# Patient Record
Sex: Female | Born: 1950
Health system: Southern US, Community
[De-identification: ages and names within clinical notes are randomized; demographics above are authoritative.]

## PROBLEM LIST (undated history)

## (undated) DIAGNOSIS — K589 Irritable bowel syndrome without diarrhea: Secondary | ICD-10-CM

## (undated) DIAGNOSIS — I341 Nonrheumatic mitral (valve) prolapse: Secondary | ICD-10-CM

## (undated) DIAGNOSIS — B001 Herpesviral vesicular dermatitis: Secondary | ICD-10-CM

## (undated) DIAGNOSIS — H269 Unspecified cataract: Secondary | ICD-10-CM

## (undated) DIAGNOSIS — R011 Cardiac murmur, unspecified: Secondary | ICD-10-CM

## (undated) DIAGNOSIS — IMO0001 Reserved for inherently not codable concepts without codable children: Secondary | ICD-10-CM

## (undated) DIAGNOSIS — K219 Gastro-esophageal reflux disease without esophagitis: Secondary | ICD-10-CM

## (undated) DIAGNOSIS — M810 Age-related osteoporosis without current pathological fracture: Secondary | ICD-10-CM

## (undated) DIAGNOSIS — R079 Chest pain, unspecified: Secondary | ICD-10-CM

## (undated) DIAGNOSIS — E785 Hyperlipidemia, unspecified: Secondary | ICD-10-CM

## (undated) HISTORY — DX: Nonrheumatic mitral (valve) prolapse: I34.1

## (undated) HISTORY — DX: Unspecified cataract: H26.9

## (undated) HISTORY — PX: CHOLECYSTECTOMY: SHX55

## (undated) HISTORY — DX: Irritable bowel syndrome, unspecified: K58.9

## (undated) HISTORY — PX: EYE SURGERY: SHX253

## (undated) HISTORY — DX: Cardiac murmur, unspecified: R01.1

## (undated) HISTORY — DX: Herpesviral vesicular dermatitis: B00.1

## (undated) HISTORY — DX: Reserved for inherently not codable concepts without codable children: IMO0001

## (undated) HISTORY — DX: Gastro-esophageal reflux disease without esophagitis: K21.9

## (undated) HISTORY — DX: Hyperlipidemia, unspecified: E78.5

## (undated) HISTORY — DX: Chest pain, unspecified: R07.9

## (undated) HISTORY — DX: Age-related osteoporosis without current pathological fracture: M81.0

---

## 2004-10-11 ENCOUNTER — Ambulatory Visit: Payer: Self-pay | Admitting: Unknown Physician Specialty

## 2005-10-14 DIAGNOSIS — IMO0001 Reserved for inherently not codable concepts without codable children: Secondary | ICD-10-CM

## 2005-10-14 HISTORY — DX: Reserved for inherently not codable concepts without codable children: IMO0001

## 2005-10-17 ENCOUNTER — Ambulatory Visit: Payer: Self-pay | Admitting: Unknown Physician Specialty

## 2006-10-30 ENCOUNTER — Ambulatory Visit: Payer: Self-pay | Admitting: Unknown Physician Specialty

## 2006-11-27 ENCOUNTER — Ambulatory Visit: Payer: Self-pay | Admitting: Unknown Physician Specialty

## 2006-12-01 ENCOUNTER — Ambulatory Visit: Payer: Self-pay | Admitting: Unknown Physician Specialty

## 2007-04-23 LAB — HM COLONOSCOPY

## 2007-11-24 ENCOUNTER — Ambulatory Visit: Payer: Self-pay | Admitting: Unknown Physician Specialty

## 2008-11-28 ENCOUNTER — Ambulatory Visit: Payer: Self-pay | Admitting: Unknown Physician Specialty

## 2009-12-05 ENCOUNTER — Ambulatory Visit: Payer: Self-pay | Admitting: Unknown Physician Specialty

## 2010-12-07 ENCOUNTER — Ambulatory Visit: Payer: Self-pay | Admitting: Unknown Physician Specialty

## 2011-10-24 ENCOUNTER — Ambulatory Visit (INDEPENDENT_AMBULATORY_CARE_PROVIDER_SITE_OTHER): Payer: 59 | Admitting: Internal Medicine

## 2011-10-24 ENCOUNTER — Encounter: Payer: Self-pay | Admitting: Internal Medicine

## 2011-10-24 VITALS — BP 102/70 | HR 78 | Temp 98.1°F | Ht 64.5 in | Wt 122.0 lb

## 2011-10-24 DIAGNOSIS — E785 Hyperlipidemia, unspecified: Secondary | ICD-10-CM

## 2011-10-24 DIAGNOSIS — Z79899 Other long term (current) drug therapy: Secondary | ICD-10-CM

## 2011-10-24 DIAGNOSIS — K589 Irritable bowel syndrome without diarrhea: Secondary | ICD-10-CM

## 2011-10-24 DIAGNOSIS — Z23 Encounter for immunization: Secondary | ICD-10-CM

## 2011-10-24 DIAGNOSIS — E559 Vitamin D deficiency, unspecified: Secondary | ICD-10-CM

## 2011-10-24 DIAGNOSIS — M8580 Other specified disorders of bone density and structure, unspecified site: Secondary | ICD-10-CM

## 2011-10-24 DIAGNOSIS — IMO0001 Reserved for inherently not codable concepts without codable children: Secondary | ICD-10-CM | POA: Insufficient documentation

## 2011-10-24 DIAGNOSIS — M810 Age-related osteoporosis without current pathological fracture: Secondary | ICD-10-CM | POA: Insufficient documentation

## 2011-10-24 DIAGNOSIS — M899 Disorder of bone, unspecified: Secondary | ICD-10-CM

## 2011-10-24 DIAGNOSIS — M949 Disorder of cartilage, unspecified: Secondary | ICD-10-CM

## 2011-10-24 DIAGNOSIS — R197 Diarrhea, unspecified: Secondary | ICD-10-CM

## 2011-10-24 MED ORDER — DICYCLOMINE HCL 10 MG PO CAPS: 10.0000 mg | ORAL_CAPSULE | Freq: Three times a day (TID) | ORAL | Status: DC

## 2011-10-24 NOTE — Assessment & Plan Note (Addendum)
She has been taking alendronate for one to three years for bone loss, but is not certain of her T scores or last DEXA screening.  Records requested.  Discussed the  Risk and benefits of prolonged use of bisphophonates and made suggestions to her exercise regimen to add weight lifting/strenght training to upper torso.

## 2011-10-24 NOTE — Patient Instructions (Addendum)
I am prescribing an antispasmodic to try 30 minutes prior to each meal that typically causes cramping and diarrhea.  It is called dicyclomine.  Return at your convenience for fasting labs.

## 2011-10-25 ENCOUNTER — Other Ambulatory Visit (INDEPENDENT_AMBULATORY_CARE_PROVIDER_SITE_OTHER): Payer: 59 | Admitting: *Deleted

## 2011-10-25 DIAGNOSIS — R197 Diarrhea, unspecified: Secondary | ICD-10-CM

## 2011-10-25 DIAGNOSIS — E559 Vitamin D deficiency, unspecified: Secondary | ICD-10-CM

## 2011-10-25 DIAGNOSIS — Z79899 Other long term (current) drug therapy: Secondary | ICD-10-CM

## 2011-10-25 DIAGNOSIS — E785 Hyperlipidemia, unspecified: Secondary | ICD-10-CM

## 2011-10-25 LAB — COMPREHENSIVE METABOLIC PANEL
ALT: 20 U/L (ref 0–35)
AST: 27 U/L (ref 0–37)
Albumin: 4.1 g/dL (ref 3.5–5.2)
Alkaline Phosphatase: 59 U/L (ref 39–117)
BUN: 17 mg/dL (ref 6–23)
CO2: 27 mEq/L (ref 19–32)
Calcium: 9.2 mg/dL (ref 8.4–10.5)
Chloride: 108 mEq/L (ref 96–112)
Creatinine, Ser: 0.9 mg/dL (ref 0.4–1.2)
GFR: 64.44 mL/min (ref >60.00)
Glucose, Bld: 85 mg/dL (ref 70–99)
Potassium: 4.5 mEq/L (ref 3.5–5.1)
Sodium: 140 mEq/L (ref 135–145)
Total Bilirubin: 1 mg/dL (ref 0.3–1.2)
Total Protein: 7.4 g/dL (ref 6.0–8.3)

## 2011-10-25 LAB — LIPID PANEL
Cholesterol: 190 mg/dL (ref 0–200)
HDL: 75.5 mg/dL (ref >39.00)
LDL Cholesterol: 94 mg/dL (ref 0–99)
Total CHOL/HDL Ratio: 3
Triglycerides: 101 mg/dL (ref 0.0–149.0)
VLDL: 20.2 mg/dL (ref 0.0–40.0)

## 2011-10-25 LAB — MAGNESIUM: Magnesium: 2.1 mg/dL (ref 1.5–2.5)

## 2011-10-26 LAB — VITAMIN D 25 HYDROXY (VIT D DEFICIENCY, FRACTURES): Vit D, 25-Hydroxy: 40 ng/mL (ref 30–89)

## 2011-10-27 ENCOUNTER — Encounter: Payer: Self-pay | Admitting: Internal Medicine

## 2011-10-27 DIAGNOSIS — K589 Irritable bowel syndrome without diarrhea: Secondary | ICD-10-CM | POA: Insufficient documentation

## 2011-10-27 NOTE — Assessment & Plan Note (Signed)
Discussed a trial of dicyclomine for the postprandial abdominal cramping and diarrhea that she frequently has .

## 2011-10-27 NOTE — Progress Notes (Signed)
  Subjective:    Patient ID: Taylor [name], female    DOB: [birth year], 61 y.o.   MRN: [id number]  HPI  Taylor [name] is a healthy 61 yr white female who is here to establish primary [name].  She has a history of IBS which she controls with dietary modifications.  She continues to have episodes of abdominal cramping and diarrhea after certain meals periodically but not daily.  She denies unintentional weight loss, rectal bleeding,  And nausea.  She is up to date with colon cancer screening.  Does not often have constipation.  Past Medical History  Diagnosis Date  . Normal cardiac stress test 2007  . Irritable bowel syndrome    No current outpatient prescriptions on file prior to visit.    Review of Systems  Constitutional: Negative for fever, chills, appetite change, fatigue and unexpected weight change.  HENT: Negative for hearing loss, ear pain, nosebleeds, congestion, sore throat, facial swelling, rhinorrhea, sneezing, mouth sores, trouble swallowing, neck pain, neck stiffness, voice change, postnasal drip, sinus pressure, tinnitus and ear discharge.   Eyes: Negative for pain, discharge, redness and visual disturbance.  Respiratory: Negative for cough, chest tightness, shortness of breath, wheezing and stridor.   Cardiovascular: Negative for chest pain, palpitations and leg swelling.  Gastrointestinal: Negative for nausea, vomiting, abdominal pain, diarrhea, constipation, blood in stool, abdominal distention and anal bleeding.  Genitourinary: Negative for dysuria, flank pain, vaginal bleeding and vaginal discharge.  Musculoskeletal: Negative for myalgias, arthralgias and gait problem.  Skin: Negative for color change and rash.  Neurological: Negative for dizziness, weakness, light-headedness and headaches.  Hematological: Negative for adenopathy. Does not bruise/bleed easily.  Psychiatric/Behavioral: Negative for suicidal ideas, sleep disturbance and dysphoric mood. The patient is not  nervous/anxious.        Objective:   Physical Exam  Constitutional: She is oriented to person, place, and time. She appears well-developed and well-nourished.  HENT:  Mouth/Throat: Oropharynx is clear and moist.  Eyes: EOM are normal. Pupils are equal, round, and reactive to light. No scleral icterus.  Neck: Normal range of motion. Neck supple. No JVD present. No thyromegaly present.  Cardiovascular: Normal rate, regular rhythm, normal heart sounds and intact distal pulses.   Pulmonary/Chest: Effort normal and breath sounds normal.  Abdominal: Soft. Bowel sounds are normal. She exhibits no mass. There is no tenderness.  Musculoskeletal: Normal range of motion. She exhibits no edema.  Lymphadenopathy:    She has no cervical adenopathy.  Neurological: She is alert and oriented to person, place, and time.  Skin: Skin is warm and dry.  Psychiatric: She has a normal mood and affect.          Assessment & Plan:

## 2011-10-28 ENCOUNTER — Telehealth: Payer: Self-pay | Admitting: *Deleted

## 2011-10-28 ENCOUNTER — Encounter: Payer: Self-pay | Admitting: Internal Medicine

## 2011-10-28 ENCOUNTER — Encounter: Payer: Self-pay | Admitting: *Deleted

## 2011-10-28 NOTE — Telephone Encounter (Signed)
On my family history it has that my brother and father have alcohol abuse. That is incorrect and I don't know how that got on there. I may have accidentally filled in a wrong box. Sorry. Also will all of my lab results be available on my chart and my medical history from my previous doctor. Thanks and I enjoyed meeting you last week. Burman Riis   Please review above My Chart message. I have corrected the history and will send patient a message explaining what is avail and what is not avail on My Chart. Maralyn Sago

## 2011-10-28 NOTE — Telephone Encounter (Signed)
I will correct the family history and remove the history of alcohol abuse.  I do not know if any of the records that are scanned in from other doctors will be available. Taylor [name]

## 2011-10-28 NOTE — Telephone Encounter (Signed)
Opened in error

## 2011-10-28 NOTE — Telephone Encounter (Signed)
I see no history of alcohol abuse entered into chart,  So I am not sure what patient is referring to..  Can you look into it?

## 2011-10-29 NOTE — Telephone Encounter (Signed)
This was just an FYI - I had already corrected it. No, the scanned records are not avail. Only things avail will be problem list, med list, immunization report, health maintenance, appts, any labs/resuts that YOU release from the date pt activated My Chart going forward.

## 2011-10-29 NOTE — Telephone Encounter (Signed)
Thank you Sarah

## 2011-10-30 ENCOUNTER — Telehealth: Payer: Self-pay | Admitting: *Deleted

## 2011-10-30 ENCOUNTER — Encounter: Payer: Self-pay | Admitting: *Deleted

## 2011-10-30 NOTE — Telephone Encounter (Signed)
Below is recent My Chart message. I updated pt's history. Dr Darrick Huntsman, do you remember seeing pt's records from Dr Lin Givens?

## 2011-10-30 NOTE — Telephone Encounter (Signed)
Thank you for the update. None of my relatives have substance abuse. Also you may want to add that my mother has MS and that I had gall bladder surgery about 10 years ago. Also I was wondering if by records had ever been sent by Dr.Jeffries to your office. Thanks. Nedra Hai  ----- Message -----  From: Lamar Sprinkles, CMA  Sent: 10/28/2011 1:04 PM EST  To: Burman Riis Subject: RE: Non-Urgent Medical Question   I have updated the history for your brother and father. Have either had any substance abuse problems? Or is there anything missing from your family history that we need to add? Please let me know and I will update your chart.  The only available labs or other results will be starting from the day you activated your My Chart account going forward. They have not put in place a way to load prior results at this time. Please let me know if you have any questions. I have attached a survey and we appreciate your time.   Thank you  Jeorge Reister     ----- Message ----- From: Burman Riis Sent: 10/28/2011 11:31 AM EST To: [name], MD Subject: Non-Urgent Medical Question  On my family history it has that my brother and father have alcohol abuse. That is incorrect and I don't know how that got on there. I may have accidentally filled in a wrong box. Sorry. Also will all of my lab results be available on my chart and my medical history from my previous doctor. Thanks and I enjoyed meeting you last week. Burman Riis

## 2011-10-30 NOTE — Telephone Encounter (Signed)
Release of records sent at [facility] on 1/10, sent My Chart mess to pt informing him that records could take up to 2 wks to receive.

## 2011-10-30 NOTE — Telephone Encounter (Signed)
No I have not seen them yet.

## 2011-11-22 ENCOUNTER — Encounter: Payer: Self-pay | Admitting: Internal Medicine

## 2011-11-27 ENCOUNTER — Other Ambulatory Visit: Payer: Self-pay | Admitting: *Deleted

## 2011-11-27 MED ORDER — ALENDRONATE SODIUM 70 MG PO TABS: 70.0000 mg | ORAL_TABLET | ORAL | Status: DC

## 2011-12-05 ENCOUNTER — Encounter: Payer: Self-pay | Admitting: Internal Medicine

## 2011-12-05 ENCOUNTER — Telehealth: Payer: Self-pay | Admitting: *Deleted

## 2011-12-05 NOTE — Telephone Encounter (Signed)
-----   Message from Taylor [name] to [name], MD sent at 11/22/2011 8:53 AM -----  When I went to get my fosamax prescription refilled yesterday they said that that it was the last time under my previous doctor's order and needed to be updated. I would think that my other medications will also by coming up soon also since my last physical with dr. Lin Givens was a year ago. I also wanted to make sure that your office got my records from her office that I requested on Jan. 3. Thanks. Taylor [name]

## 2011-12-06 NOTE — Telephone Encounter (Signed)
Advised pt that her records are here, she will let pharmacy know to send refill requests here.

## 2011-12-06 NOTE — Telephone Encounter (Signed)
I do not know if her records have been received.  If they have you may let her know yes or no.  Let her pharmacy know to send refill requeests to [place].

## 2011-12-07 NOTE — Telephone Encounter (Signed)
See phone note

## 2011-12-09 ENCOUNTER — Ambulatory Visit: Payer: Self-pay | Admitting: Internal Medicine

## 2011-12-10 ENCOUNTER — Ambulatory Visit: Payer: Self-pay | Admitting: Internal Medicine

## 2011-12-19 ENCOUNTER — Telehealth: Payer: Self-pay | Admitting: Internal Medicine

## 2011-12-19 NOTE — Telephone Encounter (Signed)
I am waiting for a return call from patient to ask if she has been contacted from [facility] for additional images from her mammogram.  I have the order for the additional images.

## 2011-12-24 ENCOUNTER — Encounter: Payer: Self-pay | Admitting: Internal Medicine

## 2011-12-27 ENCOUNTER — Telehealth: Payer: Self-pay | Admitting: Internal Medicine

## 2011-12-31 ENCOUNTER — Encounter: Payer: Self-pay | Admitting: Internal Medicine

## 2012-01-03 ENCOUNTER — Encounter: Payer: Self-pay | Admitting: Internal Medicine

## 2012-01-28 NOTE — Telephone Encounter (Signed)
OPened in error.

## 2012-02-28 ENCOUNTER — Other Ambulatory Visit: Payer: Self-pay | Admitting: Internal Medicine

## 2012-02-28 MED ORDER — ALENDRONATE SODIUM 70 MG PO TABS: 70.0000 mg | ORAL_TABLET | ORAL | Status: DC

## 2012-03-16 ENCOUNTER — Telehealth: Payer: Self-pay | Admitting: Internal Medicine

## 2012-03-16 NOTE — Telephone Encounter (Signed)
Caller: Lygia/Patient; PCP: Deborra Medina; CB#[phone]. Call regarding Cough/Congestion. Mrs Fortna reports she called for an appt and was sent to Triage. Sxs of cough, congestion and sore throat beginning on Thurs 03/12/12. Some fevers over the weekend and coughed up some bright red/dark blood on Sat 6/1 after a nose bleed. No further hemoptysis noted. Caller reports Dayquil helping with sxs. Home care per Cough Protocol with callback parameters. See in 24 hrs per reported facial pain and pressure, worse when bending over. Appt scheduled for Tues 6/4 at 16:15 with PCP. Caller is agreeable.

## 2012-03-17 ENCOUNTER — Ambulatory Visit (INDEPENDENT_AMBULATORY_CARE_PROVIDER_SITE_OTHER): Payer: No Typology Code available for payment source | Admitting: Internal Medicine

## 2012-03-17 ENCOUNTER — Encounter: Payer: Self-pay | Admitting: Internal Medicine

## 2012-03-17 VITALS — BP 120/78 | HR 89 | Temp 98.6°F | Resp 16 | Wt 120.5 lb

## 2012-03-17 DIAGNOSIS — J019 Acute sinusitis, unspecified: Secondary | ICD-10-CM | POA: Insufficient documentation

## 2012-03-17 MED ORDER — BENZONATATE 200 MG PO CAPS: 200.0000 mg | ORAL_CAPSULE | Freq: Two times a day (BID) | ORAL | Status: AC | PRN

## 2012-03-17 MED ORDER — HYDROCODONE-ACETAMINOPHEN 10-325 MG PO TABS: 1.0000 | ORAL_TABLET | Freq: Three times a day (TID) | ORAL | Status: AC | PRN

## 2012-03-17 MED ORDER — PREDNISONE (PAK) 10 MG PO TABS: ORAL_TABLET | ORAL | Status: AC

## 2012-03-17 MED ORDER — AMOXICILLIN-POT CLAVULANATE 875-125 MG PO TABS: 1.0000 | ORAL_TABLET | Freq: Two times a day (BID) | ORAL | Status: AC

## 2012-03-17 NOTE — Progress Notes (Signed)
Patient ID: Taylor [name], female   DOB: 12/09/1950, 61 y.o.   MRN: [id number]  Patient Active Problem List  Diagnoses  . Osteopenia  . Normal cardiac stress test  . Irritable bowel syndrome  . Sinusitis acute    Subjective:  CC:   Chief Complaint  Patient presents with  . Sinusitis    HPI:   Taylor [name] a 61 y.o. female who presents with a 6 day history of URI symptoms.  Started with sinus drainage on Wednesday,  Developed sore throat on Friday .  Used decongestnats, saline gargles and sinus lavage  Started having fevers to 100.4 and purulent blood streaked nasal discharge and sputum on Sunday. Last night coughed the entire night despite using NyquilDelsym.  No allergy symptoms or sick contacts.  Gets these symptoms twice annually.     Past Medical History  Diagnosis Date  . Normal cardiac stress test 2007  . Irritable bowel syndrome     Past Surgical History  Procedure Date  . Cholecystectomy Approx - 2002         The following portions of the patient's history were reviewed and updated as appropriate: Allergies, current medications, and problem list.    Review of Systems:   12 Pt  review of systems was negative except those addressed in the HPI,     History   Social History  . Marital Status: Married    Spouse Name: N/A    Number of Children: N/A  . Years of Education: N/A   Occupational History  . Not on file.   Social History Main Topics  . Smoking status: Never Smoker   . Smokeless tobacco: Never Used  . Alcohol Use: 1.8 oz/week    3 Glasses of wine per week  . Drug Use: No  . Sexually Active: Not on file   Other Topics Concern  . Not on file   Social History Narrative  . No narrative on file    Objective:  BP 120/78  Pulse 89  Temp(Src) 98.6 F (37 C) (Oral)  Resp 16  Wt 120 lb 8 oz (54.658 kg)  SpO2 94%  General appearance: alert, cooperative and appears stated age Ears: normal TM's and external ear canals both ears Sinuses:  maxillary tenderness bilaterally Throat: lips, mucosa, and tongue normal; teeth and gums normal, erythema without exudate in oropharynx Neck: no adenopathy, no carotid bruit, supple, symmetrical, trachea midline and thyroid not enlarged, symmetric, no tenderness/mass/nodules Back: symmetric, no curvature. ROM normal. No CVA tenderness. Lungs: clear to auscultation bilaterally Heart: regular rate and rhythm, S1, S2 normal, no murmur, click, rub or gallop Abdomen: soft, non-tender; bowel sounds normal; no masses,  no organomegaly Pulses: 2+ and symmetric Skin: Skin color, texture, turgor normal. No rashes or lesions Lymph nodes: Cervical, LAD present  Assessment and Plan:  Sinusitis acute Given chronicity of symptoms, development of facial pain and exam consistent with bacterial URI,  Will treat with empiric antibiotics, decongestants, cough suppressants and steroid taper.     Updated Medication List Outpatient Encounter Prescriptions as of 03/17/2012  Medication Sig Dispense Refill  . alendronate (FOSAMAX) 70 MG tablet Take 1 tablet (70 mg total) by mouth every 7 (seven) days. Take with a full glass of water on an empty stomach.  4 tablet  1  . Calcium-Vitamin D-Vitamin K (VIACTIV PO) Take as directed      . rosuvastatin (CRESTOR) 10 MG tablet Take one half tablet by mouth every other day      .  VITAMIN D, CHOLECALCIFEROL, PO Take one by mouth daily      . amoxicillin-clavulanate (AUGMENTIN) 875-125 MG per tablet Take 1 tablet by mouth 2 (two) times daily.  14 tablet  0  . benzonatate (TESSALON) 200 MG capsule Take 1 capsule (200 mg total) by mouth 2 (two) times daily as needed for cough.  20 capsule  0  . HYDROcodone-acetaminophen (NORCO) 10-325 MG per tablet Take 1 tablet by mouth every 8 (eight) hours as needed for pain. Or severe cough  30 tablet  0  . predniSONE (STERAPRED UNI-PAK) 10 MG tablet 6 tablets on Day 1 , then reduce by 1 tablet daily until gone  21 tablet  0  . DISCONTD:  dicyclomine (BENTYL) 10 MG capsule Take 1 capsule (10 mg total) by mouth 4 (four) times daily -  before meals and at bedtime.  90 capsule  1     No orders of the defined types were placed in this encounter.    No Follow-up on file.

## 2012-03-17 NOTE — Assessment & Plan Note (Signed)
Given chronicity of symptoms, development of facial pain and exam consistent with bacterial URI,  Will treat with empiric antibiotics, decongestants, cough suppressants and steroid taper.

## 2012-04-22 ENCOUNTER — Ambulatory Visit (INDEPENDENT_AMBULATORY_CARE_PROVIDER_SITE_OTHER)
Admission: RE | Admit: 2012-04-22 | Discharge: 2012-04-22 | Disposition: A | Discharge status: Home / Self Care | Payer: No Typology Code available for payment source | Source: Ambulatory Visit | Attending: Internal Medicine | Admitting: Internal Medicine

## 2012-04-22 ENCOUNTER — Ambulatory Visit (INDEPENDENT_AMBULATORY_CARE_PROVIDER_SITE_OTHER): Payer: No Typology Code available for payment source | Admitting: Internal Medicine

## 2012-04-22 ENCOUNTER — Encounter: Payer: Self-pay | Admitting: Internal Medicine

## 2012-04-22 ENCOUNTER — Other Ambulatory Visit (HOSPITAL_COMMUNITY)
Admission: RE | Admit: 2012-04-22 | Discharge: 2012-04-22 | Disposition: A | Discharge status: Home / Self Care | Payer: No Typology Code available for payment source | Source: Ambulatory Visit | Attending: Internal Medicine | Admitting: Internal Medicine

## 2012-04-22 VITALS — BP 102/60 | HR 78 | Temp 97.8°F | Resp 14 | Wt 119.8 lb

## 2012-04-22 DIAGNOSIS — Z79899 Other long term (current) drug therapy: Secondary | ICD-10-CM

## 2012-04-22 DIAGNOSIS — Z0000 Encounter for general adult medical examination without abnormal findings: Secondary | ICD-10-CM

## 2012-04-22 DIAGNOSIS — Z124 Encounter for screening for malignant neoplasm of cervix: Secondary | ICD-10-CM

## 2012-04-22 DIAGNOSIS — R1013 Epigastric pain: Secondary | ICD-10-CM

## 2012-04-22 DIAGNOSIS — E785 Hyperlipidemia, unspecified: Secondary | ICD-10-CM

## 2012-04-22 DIAGNOSIS — K3189 Other diseases of stomach and duodenum: Secondary | ICD-10-CM

## 2012-04-22 DIAGNOSIS — Z01419 Encounter for gynecological examination (general) (routine) without abnormal findings: Secondary | ICD-10-CM | POA: Insufficient documentation

## 2012-04-22 DIAGNOSIS — R3129 Other microscopic hematuria: Secondary | ICD-10-CM

## 2012-04-22 DIAGNOSIS — Z87898 Personal history of other specified conditions: Secondary | ICD-10-CM

## 2012-04-22 DIAGNOSIS — Z1159 Encounter for screening for other viral diseases: Secondary | ICD-10-CM | POA: Insufficient documentation

## 2012-04-22 DIAGNOSIS — Z0001 Encounter for general adult medical examination with abnormal findings: Secondary | ICD-10-CM | POA: Insufficient documentation

## 2012-04-22 DIAGNOSIS — R079 Chest pain, unspecified: Secondary | ICD-10-CM

## 2012-04-22 LAB — POCT URINALYSIS DIPSTICK
Bilirubin, UA: NEGATIVE
Glucose, UA: NEGATIVE
Ketones, UA: NEGATIVE
Nitrite, UA: NEGATIVE
Protein, UA: NEGATIVE
Spec Grav, UA: 1.02
Urobilinogen, UA: 0.2
pH, UA: 5.5

## 2012-04-22 LAB — HM MAMMOGRAPHY: HM Mammogram: ABNORMAL

## 2012-04-22 MED ORDER — OMEPRAZOLE 40 MG PO CPDR: 40.0000 mg | DELAYED_RELEASE_CAPSULE | Freq: Every day | ORAL | Status: DC

## 2012-04-22 NOTE — Assessment & Plan Note (Signed)
Annual PE done today with pelvic and breast exam and PAP done

## 2012-04-22 NOTE — Progress Notes (Signed)
Patient ID: Taylor [name], female   DOB: 1951-07-23, 61 y.o.   MRN: [id number]  Subjective:    Taylor [name] is a 61 y.o. female who presents for an annual exam. The patient has no complaints today. The patient is sexually active. GYN screening history: last pap: was normal. The patient wears seatbelts: yes. The patient participates in regular exercise: yes. Has the patient ever been transfused or tattooed?: no. The patient reports that there is not domestic violence in her life.   Menstrual History: OB History    Grav Para Term Preterm Abortions TAB SAB Ect Mult Living                  Menarche age: 60   No LMP recorded. Patient is postmenopausal.    The following portions of the patient's history were reviewed and updated as appropriate: allergies, current medications, past family history, past medical history, past social history, past surgical history and problem list.  Review of Systems A comprehensive review of systems was negative except for: Cardiovascular: positive for chest pain    Objective:    BP 102/60  Pulse 78  Temp 97.8 F (36.6 C) (Oral)  Resp 14  Wt 119 lb 12 oz (54.318 kg)  SpO2 95%  General Appearance:    Alert, cooperative, no distress, appears stated age  Head:    Normocephalic, without obvious abnormality, atraumatic  Eyes:    PERRL, conjunctiva/corneas clear, EOM's intact, fundi    benign, both eyes  Ears:    Normal TM's and external ear canals, both ears  Nose:   Nares normal, septum midline, mucosa normal, no drainage    or sinus tenderness  Throat:   Lips, mucosa, and tongue normal; teeth and gums normal  Neck:   Supple, symmetrical, trachea midline, no adenopathy;    thyroid:  no enlargement/tenderness/nodules; no carotid   bruit or JVD  Back:     Symmetric, no curvature, ROM normal, no CVA tenderness  Lungs:     Clear to auscultation bilaterally, respirations unlabored  Chest Wall:    No tenderness or deformity   Heart:    Regular rate and rhythm, S1  and S2 normal, no murmur, rub   or gallop  Breast Exam:    No tenderness, masses, or nipple abnormality  Abdomen:     Soft, non-tender, bowel sounds active all four quadrants,    no masses, no organomegaly  Genitalia:    Normal female without lesion, discharge or tenderness  Extremities:   Extremities normal, atraumatic, no cyanosis or edema  Pulses:   2+ and symmetric all extremities  Skin:   Skin color, texture, turgor normal, no rashes or lesions  Lymph nodes:   Cervical, supraclavicular, and axillary nodes normal  Neurologic:   CNII-XII intact, normal strength, sensation and reflexes    throughout  .    Assessment:   Hematuria, microscopic No prior cystoscopy.  2 years  ago will recheck today  Chest pain at rest Atypical, resolved.  Checking D dimer, chest x ray, lipase and H Pylori  Routine general medical examination at a health care facility Annual PE done today with pelvic and breast exam and PAP done    Updated Medication List Outpatient Encounter Prescriptions as of 04/22/2012  Medication Sig Dispense Refill  . alendronate (FOSAMAX) 70 MG tablet Take 1 tablet (70 mg total) by mouth every 7 (seven) days. Take with a full glass of water on an empty stomach.  4 tablet  1  .  Calcium-Vitamin D-Vitamin K (VIACTIV PO) Take as directed      . rosuvastatin (CRESTOR) 10 MG tablet Take one half tablet by mouth every other day      . VITAMIN D, CHOLECALCIFEROL, PO Take one by mouth daily      . omeprazole (PRILOSEC) 40 MG capsule Take 1 capsule (40 mg total) by mouth daily.  30 capsule  3

## 2012-04-22 NOTE — Patient Instructions (Addendum)
I am sending you to [facility] for your chest x ray.  I recommend starting omeprazole for reflux and gastritis (rx sent to [facility])  We will call with lab results

## 2012-04-22 NOTE — Assessment & Plan Note (Signed)
No prior cystoscopy.  2 years  ago will recheck today

## 2012-04-22 NOTE — Assessment & Plan Note (Signed)
Atypical, resolved.  Checking D dimer, chest x ray, lipase and H Pylori

## 2012-04-23 LAB — D-DIMER, QUANTITATIVE: D-Dimer, Quant: 0.95 ug/mL-FEU — ABNORMAL HIGH (ref 0.00–0.48)

## 2012-04-23 LAB — COMPLETE METABOLIC PANEL WITH GFR
ALT: 16 U/L (ref 0–35)
AST: 23 U/L (ref 0–37)
Albumin: 4.2 g/dL (ref 3.5–5.2)
Alkaline Phosphatase: 66 U/L (ref 39–117)
BUN: 16 mg/dL (ref 6–23)
CO2: 24 mEq/L (ref 19–32)
Calcium: 9.6 mg/dL (ref 8.4–10.5)
Chloride: 104 mEq/L (ref 96–112)
Creat: 0.92 mg/dL (ref 0.50–1.10)
GFR, Est African American: 78 mL/min
GFR, Est Non African American: 67 mL/min
Glucose, Bld: 79 mg/dL (ref 70–99)
Potassium: 4.5 mEq/L (ref 3.5–5.3)
Sodium: 138 mEq/L (ref 135–145)
Total Bilirubin: 0.6 mg/dL (ref 0.3–1.2)
Total Protein: 7.1 g/dL (ref 6.0–8.3)

## 2012-04-23 LAB — H. PYLORI ANTIBODY, IGG: H Pylori IgG: 1.03 {ISR} — ABNORMAL HIGH

## 2012-04-23 LAB — LIPID PANEL
Cholesterol: 199 mg/dL (ref 0–200)
HDL: 70 mg/dL (ref >39)
LDL Cholesterol: 108 mg/dL — ABNORMAL HIGH (ref 0–99)
Total CHOL/HDL Ratio: 2.8 Ratio
Triglycerides: 103 mg/dL (ref <150)
VLDL: 21 mg/dL (ref 0–40)

## 2012-04-23 LAB — LIPASE: Lipase: 37 U/L (ref 0–75)

## 2012-04-23 NOTE — Addendum Note (Signed)
Addended by: [name] on: 04/23/2012 04:03 PM   Modules accepted: Orders

## 2012-04-24 ENCOUNTER — Other Ambulatory Visit: Payer: Self-pay | Admitting: Internal Medicine

## 2012-04-24 ENCOUNTER — Ambulatory Visit (HOSPITAL_COMMUNITY)
Admission: RE | Admit: 2012-04-24 | Discharge: 2012-04-24 | Disposition: A | Discharge status: Home / Self Care | Payer: No Typology Code available for payment source | Source: Ambulatory Visit | Attending: Internal Medicine | Admitting: Internal Medicine

## 2012-04-24 DIAGNOSIS — Z87898 Personal history of other specified conditions: Secondary | ICD-10-CM

## 2012-04-24 DIAGNOSIS — I77810 Thoracic aortic ectasia: Secondary | ICD-10-CM | POA: Insufficient documentation

## 2012-04-24 DIAGNOSIS — R071 Chest pain on breathing: Secondary | ICD-10-CM | POA: Insufficient documentation

## 2012-04-24 LAB — URINE CULTURE: Colony Count: 7000

## 2012-04-24 MED ORDER — IOHEXOL 350 MG/ML SOLN
100.0000 mL | Freq: Once | INTRAVENOUS | Status: AC | PRN
  Administered 2012-04-24: 100 mL via INTRAVENOUS

## 2012-04-26 ENCOUNTER — Encounter: Payer: Self-pay | Admitting: Internal Medicine

## 2012-04-27 ENCOUNTER — Telehealth: Payer: Self-pay | Admitting: *Deleted

## 2012-04-27 LAB — HM PAP SMEAR: HM Pap smear: NEGATIVE

## 2012-04-27 NOTE — Telephone Encounter (Signed)
Pt states that she has had more diarrhea than usual over the weekend and also indigestion. She is wondering if this is a side effect from the Fosamax still?

## 2012-04-28 NOTE — Telephone Encounter (Signed)
Left message for pt to callback office.  

## 2012-04-28 NOTE — Telephone Encounter (Signed)
Confirm that she has stopped fosomax and started omeprazole.

## 2012-04-28 NOTE — Telephone Encounter (Signed)
Pt has stopped taking Fosamax and started Omeprazole.

## 2012-05-04 ENCOUNTER — Encounter: Payer: Self-pay | Admitting: *Deleted

## 2012-05-14 ENCOUNTER — Encounter: Payer: Self-pay | Admitting: Internal Medicine

## 2012-05-19 ENCOUNTER — Encounter: Payer: Self-pay | Admitting: Internal Medicine

## 2012-05-19 DIAGNOSIS — R319 Hematuria, unspecified: Secondary | ICD-10-CM

## 2012-05-20 ENCOUNTER — Other Ambulatory Visit (INDEPENDENT_AMBULATORY_CARE_PROVIDER_SITE_OTHER): Payer: No Typology Code available for payment source | Admitting: *Deleted

## 2012-05-20 DIAGNOSIS — N390 Urinary tract infection, site not specified: Secondary | ICD-10-CM

## 2012-05-21 LAB — URINALYSIS
Bilirubin Urine: NEGATIVE
Glucose, UA: NEGATIVE mg/dL
Hgb urine dipstick: NEGATIVE
Ketones, ur: NEGATIVE mg/dL
Leukocytes, UA: NEGATIVE
Nitrite: NEGATIVE
Protein, ur: NEGATIVE mg/dL
Specific Gravity, Urine: 1.005 (ref 1.005–1.030)
Urobilinogen, UA: 0.2 mg/dL (ref 0.0–1.0)
pH: 6 (ref 5.0–8.0)

## 2012-10-23 ENCOUNTER — Ambulatory Visit (INDEPENDENT_AMBULATORY_CARE_PROVIDER_SITE_OTHER): Payer: No Typology Code available for payment source | Admitting: Internal Medicine

## 2012-10-23 ENCOUNTER — Encounter: Payer: Self-pay | Admitting: Internal Medicine

## 2012-10-23 VITALS — BP 118/80 | HR 96 | Temp 98.4°F | Resp 16 | Wt 120.5 lb

## 2012-10-23 DIAGNOSIS — R319 Hematuria, unspecified: Secondary | ICD-10-CM

## 2012-10-23 DIAGNOSIS — Z1331 Encounter for screening for depression: Secondary | ICD-10-CM

## 2012-10-23 DIAGNOSIS — M899 Disorder of bone, unspecified: Secondary | ICD-10-CM

## 2012-10-23 DIAGNOSIS — Z79899 Other long term (current) drug therapy: Secondary | ICD-10-CM

## 2012-10-23 DIAGNOSIS — K219 Gastro-esophageal reflux disease without esophagitis: Secondary | ICD-10-CM

## 2012-10-23 DIAGNOSIS — K915 Postcholecystectomy syndrome: Secondary | ICD-10-CM

## 2012-10-23 DIAGNOSIS — M949 Disorder of cartilage, unspecified: Secondary | ICD-10-CM

## 2012-10-23 DIAGNOSIS — T50905A Adverse effect of unspecified drugs, medicaments and biological substances, initial encounter: Secondary | ICD-10-CM

## 2012-10-23 DIAGNOSIS — M810 Age-related osteoporosis without current pathological fracture: Secondary | ICD-10-CM

## 2012-10-23 DIAGNOSIS — K208 Other esophagitis without bleeding: Secondary | ICD-10-CM

## 2012-10-23 DIAGNOSIS — M8580 Other specified disorders of bone density and structure, unspecified site: Secondary | ICD-10-CM

## 2012-10-23 HISTORY — DX: Adverse effect of unspecified drugs, medicaments and biological substances, initial encounter: T50.905A

## 2012-10-23 LAB — URINALYSIS, ROUTINE W REFLEX MICROSCOPIC
Bilirubin Urine: NEGATIVE
Ketones, ur: NEGATIVE
Nitrite: NEGATIVE
Specific Gravity, Urine: 1.025 (ref 1.000–1.030)
Total Protein, Urine: NEGATIVE
Urine Glucose: NEGATIVE
Urobilinogen, UA: 0.2 (ref 0.0–1.0)
pH: 6 (ref 5.0–8.0)

## 2012-10-23 LAB — COMPREHENSIVE METABOLIC PANEL
ALT: 26 U/L (ref 0–35)
AST: 30 U/L (ref 0–37)
Albumin: 3.9 g/dL (ref 3.5–5.2)
Alkaline Phosphatase: 88 U/L (ref 39–117)
BUN: 18 mg/dL (ref 6–23)
CO2: 26 mEq/L (ref 19–32)
Calcium: 9.7 mg/dL (ref 8.4–10.5)
Chloride: 107 mEq/L (ref 96–112)
Creatinine, Ser: 1 mg/dL (ref 0.4–1.2)
GFR: 57.16 mL/min — ABNORMAL LOW (ref >60.00)
Glucose, Bld: 67 mg/dL — ABNORMAL LOW (ref 70–99)
Potassium: 4.2 mEq/L (ref 3.5–5.1)
Sodium: 140 mEq/L (ref 135–145)
Total Bilirubin: 0.7 mg/dL (ref 0.3–1.2)
Total Protein: 7.8 g/dL (ref 6.0–8.3)

## 2012-10-23 MED ORDER — LEVOFLOXACIN 500 MG PO TABS: 500.0000 mg | ORAL_TABLET | Freq: Every day | ORAL | Status: DC

## 2012-10-23 MED ORDER — CHOLESTYRAMINE 4 GM/DOSE PO POWD: 4.0000 g | Freq: Three times a day (TID) | ORAL | Status: DC

## 2012-10-23 NOTE — Progress Notes (Signed)
Patient ID: Taylor [name], female   DOB: [birth year], 62 y.o.   MRN: [id number]  Patient Active Problem List  Diagnosis  . Osteoporosis  . Normal cardiac stress test  . Irritable bowel syndrome  . Sinusitis acute  . Hematuria, microscopic  . Routine general medical examination at a health care facility  . Pill esophagitis  . Post-cholecystectomy syndrome    Subjective:  CC:   Chief Complaint  Patient presents with  . Follow-up    HPI:   Taylor [name] a 62 y.o. female who presents for months follow up.  At last visit she had an evaluation for chest pain and pill esophagitis was diagnosed,  Boniva was stopped and her symptoms resolved, but she reports recurrent  episodes of loose stools once or twice a week after her first meal of the day, which is often lunch.  Has been occurring for years she her lap chole. Previously managed with [place]. 2) Recent UTI.  2 weeks ago had 101 fever,  Headache for one week.,  Sinus  Pain and purulent drainage .  Symptoms resolved.    Past Medical History  Diagnosis Date  . Normal cardiac stress test 2007  . Irritable bowel syndrome   . Mitral valve prolapse   . Chest pain   . Fever blister   . Hyperlipidemia   . Osteoporosis     Past Surgical History  Procedure Date  . Cholecystectomy Approx - 2002    The following portions of the patient's history were reviewed and updated as appropriate: Allergies, current medications, and problem list.    Review of Systems:  Patient denies headache, fevers, malaise, unintentional weight loss, skin rash, eye pain, sinus congestion and sinus pain, sore throat, dysphagia,  hemoptysis , cough, dyspnea, wheezing, chest pain, palpitations, orthopnea, edema, abdominal pain, nausea, melena, constipation, flank pain, dysuria, hematuria, urinary  Frequency, nocturia, numbness, tingling, seizures,  Focal weakness, Loss of consciousness,  Tremor, insomnia, depression, anxiety, and suicidal ideation.       History     Social History  . Marital Status: Married    Spouse Name: N/A    Number of Children: N/A  . Years of Education: N/A   Occupational History  . Not on file.   Social History Main Topics  . Smoking status: Never Smoker   . Smokeless tobacco: Never Used  . Alcohol Use: 1.8 oz/week    3 Glasses of wine per week  . Drug Use: No  . Sexually Active: Not on file   Other Topics Concern  . Not on file   Social History Narrative  . No narrative on file    Objective:  BP 118/80  Pulse 96  Temp 98.4 F (36.9 C) (Oral)  Resp 16  Wt 120 lb 8 oz (54.658 kg)  SpO2 98%  General appearance: alert, cooperative and appears stated age Ears: normal TM's and external ear canals both ears Throat: lips, mucosa, and tongue normal; teeth and gums normal Neck: no adenopathy, no carotid bruit, supple, symmetrical, trachea midline and thyroid not enlarged, symmetric, no tenderness/mass/nodules Back: symmetric, no curvature. ROM normal. No CVA tenderness. Lungs: clear to auscultation bilaterally Heart: regular rate and rhythm, S1, S2 normal, no murmur, click, rub or gallop Abdomen: soft, non-tender; bowel sounds normal; no masses,  no organomegaly Pulses: 2+ and symmetric Skin: Skin color, texture, turgor normal. No rashes or lesions Lymph nodes: Cervical, supraclavicular, and axillary nodes normal.  Assessment and Plan:  Pill esophagitis Referral to [facility] for  follow up on 10 yr history of GERD. Not currently on therapy.  Since her symptoms are currently absent , suggested having the EGD without recent use of PPIs to assess for inflammation at baseline.   Osteoporosis  Managed for 2 years with bisphosphonates, calcium, vit D and exercise,  Will repeat DEXA in July  To assess for loss of bone density in the interim year without treatment.   Post-cholecystectomy syndrome Resuming cholestyramine; for diarrhea occurring post prandially   Updated Medication List Outpatient Encounter  Prescriptions as of 10/23/2012  Medication Sig Dispense Refill  . Calcium-Vitamin D-Vitamin K (VIACTIV PO) Take as directed      . rosuvastatin (CRESTOR) 10 MG tablet Take one half tablet by mouth every other day      . VITAMIN D, CHOLECALCIFEROL, PO Take one by mouth daily      . alendronate (FOSAMAX) 70 MG tablet Take 1 tablet (70 mg total) by mouth every 7 (seven) days. Take with a full glass of water on an empty stomach.  4 tablet  1  . cholestyramine (QUESTRAN) 4 GM/DOSE powder Take 1 packet (4 g total) by mouth 3 (three) times daily with meals.  378 g  12  . levofloxacin (LEVAQUIN) 500 MG tablet Take 1 tablet (500 mg total) by mouth daily.  7 tablet  0  . omeprazole (PRILOSEC) 40 MG capsule Take 1 capsule (40 mg total) by mouth daily.  30 capsule  3

## 2012-10-23 NOTE — Assessment & Plan Note (Addendum)
Referral to [facility] for follow up on 10 yr history of GERD. Not currently on therapy.  Since her symptoms are currently absent , suggested having the EGD without recent use of PPIs to assess for inflammation at baseline.

## 2012-10-23 NOTE — Patient Instructions (Addendum)
We are referring you to [facility] to see if you continue to have esophagitis without using the proton pump inhibitors  -------------------------------------------------------------------------------------------------------------------------------------------------  Levaquin for 7 days to clear up the sinus infection Continue sudafed PE 10  To 30 mg every 8 hours as needed for congestion Can use delsym as needed for cough

## 2012-10-25 ENCOUNTER — Encounter: Payer: Self-pay | Admitting: Internal Medicine

## 2012-10-25 DIAGNOSIS — K915 Postcholecystectomy syndrome: Secondary | ICD-10-CM | POA: Insufficient documentation

## 2012-10-25 NOTE — Assessment & Plan Note (Signed)
Resuming cholestyramine; for diarrhea occurring post prandially

## 2012-10-25 NOTE — Assessment & Plan Note (Signed)
Will repeat DEXA in July  To assess for loss of bone density in the interim year without treatment.

## 2012-11-28 ENCOUNTER — Other Ambulatory Visit: Payer: Self-pay

## 2012-12-01 ENCOUNTER — Encounter: Payer: Self-pay | Admitting: Internal Medicine

## 2012-12-01 DIAGNOSIS — Z1239 Encounter for other screening for malignant neoplasm of breast: Secondary | ICD-10-CM

## 2012-12-07 ENCOUNTER — Encounter: Payer: Self-pay | Admitting: Internal Medicine

## 2012-12-08 ENCOUNTER — Telehealth: Payer: Self-pay | Admitting: Emergency Medicine

## 2012-12-10 ENCOUNTER — Ambulatory Visit: Payer: Self-pay | Admitting: Unknown Physician Specialty

## 2012-12-11 ENCOUNTER — Other Ambulatory Visit: Payer: Self-pay | Admitting: Unknown Physician Specialty

## 2012-12-11 LAB — CLOSTRIDIUM DIFFICILE BY PCR

## 2012-12-14 LAB — STOOL CULTURE

## 2012-12-15 ENCOUNTER — Ambulatory Visit (INDEPENDENT_AMBULATORY_CARE_PROVIDER_SITE_OTHER): Payer: No Typology Code available for payment source | Admitting: Internal Medicine

## 2012-12-15 ENCOUNTER — Encounter: Payer: Self-pay | Admitting: Internal Medicine

## 2012-12-15 ENCOUNTER — Telehealth: Payer: Self-pay | Admitting: Internal Medicine

## 2012-12-15 ENCOUNTER — Ambulatory Visit: Payer: Self-pay | Admitting: Internal Medicine

## 2012-12-15 VITALS — BP 120/90 | HR 100 | Temp 97.7°F | Resp 16 | Wt 118.5 lb

## 2012-12-15 DIAGNOSIS — R1031 Right lower quadrant pain: Secondary | ICD-10-CM

## 2012-12-15 DIAGNOSIS — M25551 Pain in right hip: Secondary | ICD-10-CM | POA: Insufficient documentation

## 2012-12-15 DIAGNOSIS — A0472 Enterocolitis due to Clostridium difficile, not specified as recurrent: Secondary | ICD-10-CM | POA: Insufficient documentation

## 2012-12-15 DIAGNOSIS — R3129 Other microscopic hematuria: Secondary | ICD-10-CM

## 2012-12-15 DIAGNOSIS — M25559 Pain in unspecified hip: Secondary | ICD-10-CM

## 2012-12-15 DIAGNOSIS — G8929 Other chronic pain: Secondary | ICD-10-CM | POA: Insufficient documentation

## 2012-12-15 LAB — URINALYSIS
Bilirubin Urine: NEGATIVE
Ketones, ur: NEGATIVE
Leukocytes, UA: NEGATIVE
Nitrite: NEGATIVE
Specific Gravity, Urine: <=1.005 (ref 1.000–1.030)
Total Protein, Urine: NEGATIVE
Urine Glucose: NEGATIVE
Urobilinogen, UA: 0.2 (ref 0.0–1.0)
pH: 6 (ref 5.0–8.0)

## 2012-12-15 NOTE — Assessment & Plan Note (Signed)
Repeat UA today was normal

## 2012-12-15 NOTE — Progress Notes (Signed)
Patient ID: Taylor [name], female   DOB: [birth year], 62 y.o.   MRN: [id number]   Patient Active Problem List  Diagnosis  . Osteoporosis  . Normal cardiac stress test  . Irritable bowel syndrome  . Sinusitis acute  . Hematuria, microscopic  . Routine general medical examination at a health care facility  . Pill esophagitis  . Post-cholecystectomy syndrome  . Chronic right hip pain  . Colitis due to Clostridium difficile    Subjective:  CC:   Chief Complaint  Patient presents with  . Pain    RT inguinal to flank    HPI:   Taylor [name] a 62 y.o. female who presents with 2 separate issues recently,  She recently saw Dr. Mechele Collin for an overdue endoscopy and colonoscopy and because of concurrent bloody diarrhea and abdominal pain she was sent for a contrasted CT which showed diffuse colitis.  Stool test was positive for C dificile and she has been taking oral vancomycin qid since last Friday.  Her diarrhea has improved but she continues to report bloating and anorexia.  She has lost several pounds.  GI F/u is March11.        2) Right sided low back pain which is aggravated by lateral flexion and forward flexion but notby  twisting. Also having right inguinal pain which is brought on by sitting and climbing stairs and has been present for weeks,  At time is severe and episodic .  aggravated by seated position, abduction and improves with standing up and walking, Occasional fleeting pain after intercourse in both hips,    Past Medical History  Diagnosis Date  . Normal cardiac stress test 2007  . Irritable bowel syndrome   . Mitral valve prolapse   . Chest pain   . Fever blister   . Hyperlipidemia   . Osteoporosis     Past Surgical History  Procedure Laterality Date  . Cholecystectomy  Approx - 2002       The following portions of the patient's history were reviewed and updated as appropriate: Allergies, current medications, and problem list.    Review of Systems:   12  Pt  review of systems was negative except those addressed in the HPI,     History   Social History  . Marital Status: Married    Spouse Name: N/A    Number of Children: N/A  . Years of Education: N/A   Occupational History  . Not on file.   Social History Main Topics  . Smoking status: Never Smoker   . Smokeless tobacco: Never Used  . Alcohol Use: 1.8 oz/week    3 Glasses of wine per week  . Drug Use: No  . Sexually Active: Not on file   Other Topics Concern  . Not on file   Social History Narrative  . No narrative on file    Objective:  BP 120/90  Pulse 100  Temp(Src) 97.7 F (36.5 C) (Oral)  Resp 16  Wt 118 lb 8 oz (53.751 kg)  BMI 20.03 kg/m2  SpO2 95%  General appearance: alert, cooperative and appears stated age Ears: normal TM's and external ear canals both ears Throat: lips, mucosa, and tongue normal; teeth and gums normal Neck: no adenopathy, no carotid bruit, supple, symmetrical, trachea midline and thyroid not enlarged, symmetric, no tenderness/mass/nodules Back: symmetric, no curvature. ROM normal. No CVA tenderness. Lungs: clear to auscultation bilaterally Heart: regular rate and rhythm, S1, S2 normal, no murmur, click, rub or gallop Abdomen: soft,  non-tender; bowel sounds normal; no masses,  no organomegaly Pulses: 2+ and symmetric Skin: Skin color, texture, turgor normal. No rashes or lesions Lymph nodes: Cervical, supraclavicular, and axillary nodes normal.  Assessment and Plan:  Chronic right hip pain DJD versus bursitis,  Palin films ordered.,  Avoiding NSAIDs.  Due to colitis currently  Checking UA as well given recent recurrent diarrhea.   Hematuria, microscopic Repeat UA today was normal   Updated Medication List Outpatient Encounter Prescriptions as of 12/15/2012  Medication Sig Dispense Refill  . alendronate (FOSAMAX) 70 MG tablet Take 1 tablet (70 mg total) by mouth every 7 (seven) days. Take with a full glass of water on an empty  stomach.  4 tablet  1  . Calcium-Vitamin D-Vitamin K (VIACTIV PO) Take as directed      . cholestyramine (QUESTRAN) 4 GM/DOSE powder Take 1 packet (4 g total) by mouth 3 (three) times daily with meals.  378 g  12  . omeprazole (PRILOSEC) 40 MG capsule Take 1 capsule (40 mg total) by mouth daily.  30 capsule  3  . rosuvastatin (CRESTOR) 10 MG tablet Take one half tablet by mouth every other day      . VANCOMYCIN HCL PO Take 1 tablet by mouth 4 (four) times daily.      Marland Kitchen VITAMIN D, CHOLECALCIFEROL, PO Take one by mouth daily      . [DISCONTINUED] levofloxacin (LEVAQUIN) 500 MG tablet Take 1 tablet (500 mg total) by mouth daily.  7 tablet  0   No facility-administered encounter medications on file as of 12/15/2012.

## 2012-12-15 NOTE — Assessment & Plan Note (Signed)
DJD versus bursitis,  Palin films ordered.,  Avoiding NSAIDs.  Due to colitis currently  Checking UA as well given recent recurrent diarrhea.

## 2012-12-15 NOTE — Telephone Encounter (Signed)
Patient Information:  Caller Name: Saudia  Phone: [phone]  Patient: Taylor [name], Taylor [name]  Gender: Female  DOB: 08/03/1951  Age: 62 Years  PCP: [name] (Adults only)  Office Follow Up:  Does the office need to follow up with this patient?: No  Instructions For The Office: N/A  RN Note:  GI diagnosed C-Diff colitis; currently on Vancomycin QID. Notes new onset of itchy,  non-spreading "blistered" rash on back around waist. Onset: 12/12/12.   Current flank pain 2/10; pain was 8/10 12/14/12 PM for 1 hour.  Symptoms  Reason For Call & Symptoms: Spasms of  right flank pain and lower back pain.  Pain was severe last night; nearly went to ED.  Pain was worse when sitting.  Reviewed Health History In EMR: Yes  Reviewed Medications In EMR: Yes  Reviewed Allergies In EMR: Yes  Reviewed Surgeries / Procedures: Yes  Date of Onset of Symptoms: 12/01/2012  Treatments Tried: Ibuprofen  Treatments Tried Worked: Yes  Guideline(s) Used:  Flank Pain  Disposition Per Guideline:   See Today in Office  Reason For Disposition Reached:   Moderate pain (e.g., interferes with normal activities or awakens from sleep)  Advice Given:  Reassurance:  Mild flank and back pain can result from excessive twisting, heavy lifting, or from an un-noticed minor back injury.  Most times, the pain gets better in a couple days.  Here is some care advice that should help.  Cold or Heat:  Cold Pack: For pain or swelling, use a cold pack or ice wrapped in a wet cloth. Put it on the sore area for 20 minutes. Repeat 4 times on the first day, then as needed.  Heat Pack: If pain lasts over 2 days, apply heat to the sore area. Use a heat pack, heating pad, or warm wet washcloth. Do this for 10 minutes, then as needed.  Activity:  Continue normal activities as much as your pain permits.  Staying active is usually more healing for the back than rest.  Avoid any activities that significantly increase the pain. Avoid heavy  lifting and strenuous exercise until completely well. Staying in bed is not needed.  Pain Medicines:  For pain relief, you can take either acetaminophen, ibuprofen, or naproxen.  Acetaminophen (e.g., Tylenol):  Regular Strength Tylenol: Take 650 mg (two 325 mg pills) by mouth every 4-6 hours as needed. Each Regular Strength Tylenol pill has 325 mg of acetaminophen.  Extra Strength Tylenol: Take 1,000 mg (two 500 mg pills) every 8 hours as needed. Each Extra Strength Tylenol pill has 500 mg of acetaminophen.  Call Back If:  Fever over 100.5 F (38.1 C)  Burning with urination or blood in urine  Pain lasts over 3 days  You become worse.  Appointment Scheduled:  12/15/2012 14:15:00 Appointment Scheduled Provider:  [name] (Adults only)

## 2012-12-16 ENCOUNTER — Encounter: Payer: Self-pay | Admitting: Internal Medicine

## 2012-12-17 ENCOUNTER — Telehealth: Payer: Self-pay | Admitting: Internal Medicine

## 2012-12-17 ENCOUNTER — Ambulatory Visit: Payer: Self-pay | Admitting: Internal Medicine

## 2012-12-17 DIAGNOSIS — N63 Unspecified lump in unspecified breast: Secondary | ICD-10-CM

## 2012-12-17 NOTE — Telephone Encounter (Signed)
Orders were faxed on 12/17/12. [facility] sent back new orders to sign. These were also faxed back.

## 2012-12-17 NOTE — Telephone Encounter (Signed)
Pt is at [facility] now for her mammo and is saying she found a lump at 1 o'clock position on her left breast. Delford Field is wanting to know if we could change her order to Bilateral Diagnostic Mammo with Left breast ultrasound for lump at 1 o'clock. They can't perform anything until the order is changed but they were wanting to know if Dr. Darrick Huntsman should see her in the office again or if it could be changed ???   Please call back at [phone] ask for Selena Batten

## 2012-12-17 NOTE — Telephone Encounter (Signed)
Her hip x ray is normal  And there is no evidence of joint problems I recommend PT evaluation and   If her pain persists, however, I suggest getting an MRI . Let me know if she agrees.

## 2012-12-17 NOTE — Telephone Encounter (Signed)
diagnositc mammor orderd, in the future ,  Print out the requested order and have me sign it.,  It will save time

## 2012-12-22 ENCOUNTER — Telehealth: Payer: Self-pay | Admitting: Internal Medicine

## 2012-12-22 NOTE — Telephone Encounter (Signed)
Her ultrasound did not show a lump in the left breast . if she is feeling a lump in then we need to refer her to general surgeon for fine-needle aspirate if she would like to come in first and let me assess that she should make an appointment.

## 2012-12-22 NOTE — Telephone Encounter (Signed)
Left message on voicemail for pt to return call  

## 2012-12-22 NOTE — Telephone Encounter (Signed)
Pt returned call; awaiting results.

## 2012-12-23 NOTE — Telephone Encounter (Signed)
Pt would like to hold off on the referral to the general surgeon for a little bit. States that she cannot really feel the lump now. Will call the office if it persists or worsens.

## 2013-01-08 ENCOUNTER — Encounter: Payer: Self-pay | Admitting: Internal Medicine

## 2013-01-21 ENCOUNTER — Encounter: Payer: Self-pay | Admitting: Internal Medicine

## 2013-02-26 LAB — HM COLONOSCOPY

## 2013-03-04 ENCOUNTER — Other Ambulatory Visit: Payer: Self-pay | Admitting: *Deleted

## 2013-03-05 MED ORDER — OMEPRAZOLE 40 MG PO CPDR: 40.0000 mg | DELAYED_RELEASE_CAPSULE | Freq: Every day | ORAL | Status: DC

## 2013-03-05 NOTE — Telephone Encounter (Signed)
Rx sent to pharmacy by escript  

## 2013-04-22 LAB — HM DEXA SCAN

## 2013-04-23 ENCOUNTER — Other Ambulatory Visit: Payer: Self-pay | Admitting: Internal Medicine

## 2013-04-23 ENCOUNTER — Ambulatory Visit (INDEPENDENT_AMBULATORY_CARE_PROVIDER_SITE_OTHER): Payer: No Typology Code available for payment source | Admitting: Internal Medicine

## 2013-04-23 ENCOUNTER — Encounter: Payer: Self-pay | Admitting: Internal Medicine

## 2013-04-23 VITALS — BP 112/74 | HR 83 | Temp 98.3°F | Resp 12 | Ht 64.0 in | Wt 120.5 lb

## 2013-04-23 DIAGNOSIS — E785 Hyperlipidemia, unspecified: Secondary | ICD-10-CM

## 2013-04-23 DIAGNOSIS — R21 Rash and other nonspecific skin eruption: Secondary | ICD-10-CM

## 2013-04-23 DIAGNOSIS — Z0000 Encounter for general adult medical examination without abnormal findings: Secondary | ICD-10-CM

## 2013-04-23 DIAGNOSIS — R5381 Other malaise: Secondary | ICD-10-CM

## 2013-04-23 DIAGNOSIS — E559 Vitamin D deficiency, unspecified: Secondary | ICD-10-CM

## 2013-04-23 DIAGNOSIS — R5383 Other fatigue: Secondary | ICD-10-CM

## 2013-04-23 DIAGNOSIS — N6320 Unspecified lump in the left breast, unspecified quadrant: Secondary | ICD-10-CM

## 2013-04-23 DIAGNOSIS — N63 Unspecified lump in unspecified breast: Secondary | ICD-10-CM

## 2013-04-23 LAB — COMPREHENSIVE METABOLIC PANEL
ALT: 22 U/L (ref 0–35)
AST: 30 U/L (ref 0–37)
Albumin: 4.3 g/dL (ref 3.5–5.2)
Alkaline Phosphatase: 73 U/L (ref 39–117)
BUN: 15 mg/dL (ref 6–23)
CO2: 28 mEq/L (ref 19–32)
Calcium: 9.6 mg/dL (ref 8.4–10.5)
Chloride: 105 mEq/L (ref 96–112)
Creatinine, Ser: 0.9 mg/dL (ref 0.4–1.2)
GFR: 70.12 mL/min (ref >60.00)
Glucose, Bld: 90 mg/dL (ref 70–99)
Potassium: 4.1 mEq/L (ref 3.5–5.1)
Sodium: 138 mEq/L (ref 135–145)
Total Bilirubin: 0.7 mg/dL (ref 0.3–1.2)
Total Protein: 8.1 g/dL (ref 6.0–8.3)

## 2013-04-23 LAB — CBC WITH DIFFERENTIAL/PLATELET
Basophils Absolute: 0 10*3/uL (ref 0.0–0.1)
Basophils Relative: 0.5 % (ref 0.0–3.0)
Eosinophils Absolute: 0.1 10*3/uL (ref 0.0–0.7)
Eosinophils Relative: 1.3 % (ref 0.0–5.0)
HCT: 41 % (ref 36.0–46.0)
Hemoglobin: 13.7 g/dL (ref 12.0–15.0)
Lymphocytes Relative: 36.2 % (ref 12.0–46.0)
Lymphs Abs: 2.1 10*3/uL (ref 0.7–4.0)
MCHC: 33.4 g/dL (ref 30.0–36.0)
MCV: 95.5 fl (ref 78.0–100.0)
Monocytes Absolute: 0.6 10*3/uL (ref 0.1–1.0)
Monocytes Relative: 10.9 % (ref 3.0–12.0)
Neutro Abs: 3 10*3/uL (ref 1.4–7.7)
Neutrophils Relative %: 51.1 % (ref 43.0–77.0)
Platelets: 253 10*3/uL (ref 150.0–400.0)
RBC: 4.3 Mil/uL (ref 3.87–5.11)
RDW: 13.8 % (ref 11.5–14.6)
WBC: 5.8 10*3/uL (ref 4.5–10.5)

## 2013-04-23 LAB — LIPID PANEL
Cholesterol: 216 mg/dL — ABNORMAL HIGH (ref 0–200)
HDL: 76.7 mg/dL (ref >39.00)
Total CHOL/HDL Ratio: 3
Triglycerides: 150 mg/dL — ABNORMAL HIGH (ref 0.0–149.0)
VLDL: 30 mg/dL (ref 0.0–40.0)

## 2013-04-23 LAB — HM MAMMOGRAPHY: HM Mammogram: NORMAL

## 2013-04-23 LAB — TSH: TSH: 2.34 u[IU]/mL (ref 0.35–5.50)

## 2013-04-23 MED ORDER — TETANUS-DIPHTH-ACELL PERTUSSIS 5-2.5-18.5 LF-MCG/0.5 IM SUSP: 0.5000 mL | Freq: Once | INTRAMUSCULAR | Status: DC

## 2013-04-23 MED ORDER — TRIAMCINOLONE ACETONIDE 0.025 % EX OINT: TOPICAL_OINTMENT | Freq: Two times a day (BID) | CUTANEOUS | Status: DC

## 2013-04-23 MED ORDER — ZOSTER VACCINE LIVE 19400 UNT/0.65ML ~~LOC~~ SOLR: 0.6500 mL | Freq: Once | SUBCUTANEOUS | Status: DC

## 2013-04-23 NOTE — Patient Instructions (Addendum)
You had your annual wellness exam today  We will schedule your mammogram today.  I recommend that you have the TDaP vaccine and a Shingles vaccine.

## 2013-04-23 NOTE — Assessment & Plan Note (Addendum)
Annual comprehensive exam was done including breast,.but sparing pelvic exam  All screenings have been addressed .

## 2013-04-23 NOTE — Progress Notes (Signed)
Patient ID: Taylor [name], female   DOB: 05-05-1951, 62 y.o.   MRN: [id number]  Subjective:     Taylor [name] is a 62 y.o. female and is here for a comprehensive physical exam. The patient reports persistent breast lump on the left side .  History   Social History  . Marital Status: Married    Spouse Name: N/A    Number of Children: N/A  . Years of Education: N/A   Occupational History  . Not on file.   Social History Main Topics  . Smoking status: Never Smoker   . Smokeless tobacco: Never Used  . Alcohol Use: 1.8 oz/week    3 Glasses of wine per week  . Drug Use: No  . Sexually Active: Not on file   Other Topics Concern  . Not on file   Social History Narrative  . No narrative on file   Health Maintenance  Topic Date Due  . Tetanus/tdap  04/13/1970  . Zostavax  04/14/2011  . Influenza Vaccine  06/14/2013  . Pap Smear  04/23/2015  . Mammogram  04/24/2015  . Colonoscopy  02/27/2023    The following portions of the patient's history were reviewed and updated as appropriate: allergies, current medications, past family history, past medical history, past social history, past surgical history and problem list.  Review of Systems A comprehensive review of systems was negative.   Objective:  BP 112/74  Pulse 83  Temp(Src) 98.3 F (36.8 C) (Oral)  Resp 12  Ht 5\' 4"  (1.626 m)  Wt 120 lb 8 oz (54.658 kg)  BMI 20.67 kg/m2  SpO2 98%  General Appearance:    Alert, cooperative, no distress, appears stated age  Head:    Normocephalic, without obvious abnormality, atraumatic  Eyes:    PERRL, conjunctiva/corneas clear, EOM's intact, fundi    benign, both eyes  Ears:    Normal TM's and external ear canals, both ears  Nose:   Nares normal, septum midline, mucosa normal, no drainage    or sinus tenderness  Throat:   Lips, mucosa, and tongue normal; teeth and gums normal  Neck:   Supple, symmetrical, trachea midline, no adenopathy;    thyroid:  no enlargement/tenderness/nodules;  no carotid   bruit or JVD  Back:     Symmetric, no curvature, ROM normal, no CVA tenderness  Lungs:     Clear to auscultation bilaterally, respirations unlabored  Chest Wall:    No tenderness or deformity   Heart:    Regular rate and rhythm, S1 and S2 normal, no murmur, rub   or gallop  Breast Exam:    No tenderness. Bb sized mss in upper outer quadrant .  1:00 poistion left breast  No no nipple abnormality  Abdomen:     Soft, non-tender, bowel sounds active all four quadrants,    no masses, no organomegaly  Genitalia:   deferred today   Extremities:   Extremities normal, atraumatic, no cyanosis or edema  Pulses:   2+ and symmetric all extremities  Skin:   Skin color, texture, turgor normal, no rashes or lesions  Lymph nodes:   Cervical, supraclavicular, and axillary nodes normal  Neurologic:   CNII-XII intact, normal strength, sensation and reflexes    throughout    Assessment:   Routine general medical examination at a health care facility Annual comprehensive exam was done including breast,.but sparing pelvic exam  All screenings have been addressed .   Mass of breast, left Sending patient to [facility]  for diagnostic mammogram on left at [facility] given persistent palpable abnormality   Updated Medication List Outpatient Encounter Prescriptions as of 04/23/2013  Medication Sig Dispense Refill  . Calcium-Vitamin D-Vitamin K (VIACTIV PO) Take as directed      . cholestyramine (QUESTRAN) 4 GM/DOSE powder Take 1 packet (4 g total) by mouth 3 (three) times daily with meals.  378 g  12  . omeprazole (PRILOSEC) 40 MG capsule Take 1 capsule (40 mg total) by mouth daily.  30 capsule  5  . rosuvastatin (CRESTOR) 10 MG tablet Take one half tablet by mouth every other day      . VITAMIN D, CHOLECALCIFEROL, PO Take one by mouth daily      . alendronate (FOSAMAX) 70 MG tablet Take 1 tablet (70 mg total) by mouth every 7 (seven) days. Take with a full glass of water on an empty stomach.  4  tablet  1  . TDaP (BOOSTRIX) 5-2.5-18.5 LF-MCG/0.5 injection Inject 0.5 mLs into the muscle once.  0.5 mL  0  . triamcinolone (KENALOG) 0.025 % ointment Apply topically 2 (two) times daily.  30 g  0  . VANCOMYCIN HCL PO Take 1 tablet by mouth 4 (four) times daily.      Marland Kitchen zoster vaccine live, PF, (ZOSTAVAX) 13086 UNT/0.65ML injection Inject 19,400 Units into the skin once.  1 vial  0   No facility-administered encounter medications on file as of 04/23/2013.

## 2013-04-24 LAB — VITAMIN D 25 HYDROXY (VIT D DEFICIENCY, FRACTURES): Vit D, 25-Hydroxy: 60 ng/mL (ref 30–89)

## 2013-04-25 ENCOUNTER — Encounter: Payer: Self-pay | Admitting: Internal Medicine

## 2013-04-25 NOTE — Assessment & Plan Note (Signed)
Sending patient to [facility] for diagnostic mammogram on left at [facility] given persistent palpable abnormality

## 2013-04-26 ENCOUNTER — Encounter: Payer: Self-pay | Admitting: Internal Medicine

## 2013-04-26 LAB — LDL CHOLESTEROL, DIRECT: Direct LDL: 122.6 mg/dL

## 2013-04-27 ENCOUNTER — Encounter: Payer: Self-pay | Admitting: Unknown Physician Specialty

## 2013-04-28 ENCOUNTER — Telehealth: Payer: Self-pay | Admitting: Internal Medicine

## 2013-04-28 DIAGNOSIS — M810 Age-related osteoporosis without current pathological fracture: Secondary | ICD-10-CM

## 2013-04-28 NOTE — Telephone Encounter (Signed)
Patient notified of results as requested and states she will just keep taking her calcium.

## 2013-04-28 NOTE — Telephone Encounter (Signed)
Repeat DEXA scan showed no significant change in bone density since 2011.  She still has osteoporosis by T score. If she would like to dicuss alternative therapy since she can't take alendronate, we can discuss Prolia at her next visit

## 2013-04-28 NOTE — Assessment & Plan Note (Signed)
No sig change by repeat DEXA [facility] July 2015.  Will discuss Prolia.

## 2013-04-29 ENCOUNTER — Encounter: Payer: Self-pay | Admitting: Internal Medicine

## 2013-05-04 ENCOUNTER — Ambulatory Visit
Admission: RE | Admit: 2013-05-04 | Discharge: 2013-05-04 | Disposition: A | Discharge status: Home / Self Care | Payer: No Typology Code available for payment source | Source: Ambulatory Visit | Attending: Internal Medicine | Admitting: Internal Medicine

## 2013-05-04 DIAGNOSIS — N6320 Unspecified lump in the left breast, unspecified quadrant: Secondary | ICD-10-CM

## 2013-07-12 ENCOUNTER — Encounter: Payer: Self-pay | Admitting: Internal Medicine

## 2013-08-09 ENCOUNTER — Other Ambulatory Visit: Payer: Self-pay | Admitting: Internal Medicine

## 2013-08-19 ENCOUNTER — Other Ambulatory Visit: Payer: Self-pay

## 2013-11-08 ENCOUNTER — Encounter: Payer: Self-pay | Admitting: Internal Medicine

## 2013-11-09 ENCOUNTER — Encounter: Payer: Self-pay | Admitting: Adult Health

## 2013-11-09 ENCOUNTER — Ambulatory Visit (INDEPENDENT_AMBULATORY_CARE_PROVIDER_SITE_OTHER): Payer: No Typology Code available for payment source | Admitting: Adult Health

## 2013-11-09 VITALS — BP 110/72 | HR 83 | Temp 98.5°F | Resp 12 | Wt 121.0 lb

## 2013-11-09 DIAGNOSIS — H6690 Otitis media, unspecified, unspecified ear: Secondary | ICD-10-CM

## 2013-11-09 MED ORDER — FLUTICASONE PROPIONATE 50 MCG/ACT NA SUSP: 2.0000 | Freq: Every day | NASAL | Status: DC

## 2013-11-09 MED ORDER — AZITHROMYCIN 250 MG PO TABS: ORAL_TABLET | ORAL | Status: DC

## 2013-11-09 NOTE — Progress Notes (Signed)
Pre visit review using our clinic review tool, if applicable. No additional management support is needed unless otherwise documented below in the visit note. 

## 2013-11-09 NOTE — Patient Instructions (Addendum)
  Start Azithromycin 2 tablets today then 1 tablet for the next 4 days.  Use Flonase nasal spray 2 sprays into each nostril daily.  Take Children's Dimetapp to help with congestion.  Continue the ear drops.  If no improvement by the time you complete you antibiotic please return for follow up.

## 2013-11-09 NOTE — Assessment & Plan Note (Signed)
Azithromycin. Children's Dimetapp. Flonase nasal spray. RTC if not improved by the time finishes antibiotic or sooner if necessary.

## 2013-11-09 NOTE — Progress Notes (Signed)
   Subjective:    Patient ID: Taylor [name], female    DOB: 01/23/1951, 63 y.o.   MRN: [id number]  HPI  Patient is a pleasant 63 year old female who presents to clinic status [facility] and treated for right otitis media with 3 days of azithromycin. Her symptoms have not fully resolved. She also has a sensation of fluid in her right ear. She denies pain or fever.  Current Outpatient Prescriptions on File Prior to Visit  Medication Sig Dispense Refill  . Calcium-Vitamin D-Vitamin K (VIACTIV PO) Take as directed      . cholestyramine (QUESTRAN) 4 GM/DOSE powder Take 1 packet (4 g total) by mouth 3 (three) times daily with meals.  378 g  12  . CRESTOR 10 MG tablet TAKE ONE TABLET EACH NIGHT  30 tablet  5  . omeprazole (PRILOSEC) 40 MG capsule Take 1 capsule (40 mg total) by mouth daily.  30 capsule  5  . VITAMIN D, CHOLECALCIFEROL, PO Take one by mouth daily       No current facility-administered medications on file prior to visit.     Review of Systems  Constitutional: Negative for fever and chills.  HENT: Positive for congestion. Negative for ear discharge and ear pain.        Feeling of fluid in the ear. No ear pain but has discomfort  Respiratory: Negative.   Cardiovascular: Negative.   All other systems reviewed and are negative.       Objective:   Physical Exam  Constitutional: She is oriented to person, place, and time. She appears well-developed and well-nourished. No distress.  HENT:  Head: Normocephalic and atraumatic.  Left Ear: External ear normal.  Right ear with fluid noted. Otitis media present  Cardiovascular: Normal rate and regular rhythm.   Pulmonary/Chest: Effort normal. No respiratory distress.  Musculoskeletal: Normal range of motion. She exhibits no edema and no tenderness.  Neurological: She is alert and oriented to person, place, and time.  Skin: Skin is warm and dry.  Psychiatric: She has a normal mood and affect. Her behavior is  normal. Judgment and thought content normal.          Assessment & Plan:

## 2014-03-18 ENCOUNTER — Other Ambulatory Visit: Payer: Self-pay | Admitting: Internal Medicine

## 2014-03-18 NOTE — Telephone Encounter (Signed)
Appt 04/27/14

## 2014-04-18 LAB — HM DEXA SCAN

## 2014-04-27 ENCOUNTER — Encounter: Payer: Self-pay | Admitting: Internal Medicine

## 2014-04-27 ENCOUNTER — Ambulatory Visit (INDEPENDENT_AMBULATORY_CARE_PROVIDER_SITE_OTHER): Payer: No Typology Code available for payment source | Admitting: Internal Medicine

## 2014-04-27 VITALS — BP 118/78 | HR 82 | Temp 97.7°F | Resp 16 | Ht 64.5 in | Wt 117.5 lb

## 2014-04-27 DIAGNOSIS — E785 Hyperlipidemia, unspecified: Secondary | ICD-10-CM

## 2014-04-27 DIAGNOSIS — M25551 Pain in right hip: Secondary | ICD-10-CM

## 2014-04-27 DIAGNOSIS — M25559 Pain in unspecified hip: Secondary | ICD-10-CM

## 2014-04-27 DIAGNOSIS — Z0000 Encounter for general adult medical examination without abnormal findings: Secondary | ICD-10-CM

## 2014-04-27 DIAGNOSIS — G8929 Other chronic pain: Secondary | ICD-10-CM

## 2014-04-27 DIAGNOSIS — R5383 Other fatigue: Principal | ICD-10-CM

## 2014-04-27 DIAGNOSIS — R5381 Other malaise: Secondary | ICD-10-CM

## 2014-04-27 DIAGNOSIS — Z1239 Encounter for other screening for malignant neoplasm of breast: Secondary | ICD-10-CM

## 2014-04-27 DIAGNOSIS — M810 Age-related osteoporosis without current pathological fracture: Secondary | ICD-10-CM

## 2014-04-27 LAB — LIPID PANEL
Cholesterol: 171 mg/dL (ref 0–200)
HDL: 72.7 mg/dL (ref >39.00)
LDL Cholesterol: 79 mg/dL (ref 0–99)
NonHDL: 98.3
Total CHOL/HDL Ratio: 2
Triglycerides: 99 mg/dL (ref 0.0–149.0)
VLDL: 19.8 mg/dL (ref 0.0–40.0)

## 2014-04-27 LAB — COMPREHENSIVE METABOLIC PANEL
ALT: 21 U/L (ref 0–35)
AST: 26 U/L (ref 0–37)
Albumin: 4.2 g/dL (ref 3.5–5.2)
Alkaline Phosphatase: 69 U/L (ref 39–117)
BUN: 13 mg/dL (ref 6–23)
CO2: 27 mEq/L (ref 19–32)
Calcium: 9.4 mg/dL (ref 8.4–10.5)
Chloride: 104 mEq/L (ref 96–112)
Creatinine, Ser: 1 mg/dL (ref 0.4–1.2)
GFR: 61.64 mL/min (ref >60.00)
Glucose, Bld: 95 mg/dL (ref 70–99)
Potassium: 4.2 mEq/L (ref 3.5–5.1)
Sodium: 138 mEq/L (ref 135–145)
Total Bilirubin: 0.6 mg/dL (ref 0.2–1.2)
Total Protein: 7.4 g/dL (ref 6.0–8.3)

## 2014-04-27 LAB — CBC WITH DIFFERENTIAL/PLATELET
Basophils Absolute: 0 10*3/uL (ref 0.0–0.1)
Basophils Relative: 0.4 % (ref 0.0–3.0)
Eosinophils Absolute: 0 10*3/uL (ref 0.0–0.7)
Eosinophils Relative: 0.7 % (ref 0.0–5.0)
HCT: 38.5 % (ref 36.0–46.0)
Hemoglobin: 13 g/dL (ref 12.0–15.0)
Lymphocytes Relative: 39.6 % (ref 12.0–46.0)
Lymphs Abs: 2.2 10*3/uL (ref 0.7–4.0)
MCHC: 33.8 g/dL (ref 30.0–36.0)
MCV: 92.7 fl (ref 78.0–100.0)
Monocytes Absolute: 0.6 10*3/uL (ref 0.1–1.0)
Monocytes Relative: 10.8 % (ref 3.0–12.0)
Neutro Abs: 2.7 10*3/uL (ref 1.4–7.7)
Neutrophils Relative %: 48.5 % (ref 43.0–77.0)
Platelets: 249 10*3/uL (ref 150.0–400.0)
RBC: 4.16 Mil/uL (ref 3.87–5.11)
RDW: 13.5 % (ref 11.5–15.5)
WBC: 5.5 10*3/uL (ref 4.0–10.5)

## 2014-04-27 LAB — TSH: TSH: 0.5 u[IU]/mL (ref 0.35–4.50)

## 2014-04-27 NOTE — Assessment & Plan Note (Addendum)
Discussed  A trial of monthly Boniva given her progression of disease without treatment. If she develops esophageal pain witll obtain PA for Prolia

## 2014-04-27 NOTE — Patient Instructions (Signed)
I recommend treating your osteoporosis with monthly Boniva (similar to fosamax but once a month)   I recommend getting the majority of your calcium and Vitamin D  through diet rather than supplements given the recent association of calcium supplements with increased coronary artery calcium scores (You need 1800 mg daily )   almond/coconut milk is a great low calorie low carb, cholesterol free  way to increase your dietary calcium and vitamin D.  Try the blue [facility] Maintenance, Female A healthy lifestyle and preventative care can promote health and wellness.  Maintain regular health, dental, and eye exams.  Eat a healthy diet. Foods like vegetables, fruits, whole grains, low-fat dairy products, and lean protein foods contain the nutrients you need without too many calories. Decrease your intake of foods high in solid fats, added sugars, and salt. Get information about a proper diet from your caregiver, if necessary.  Regular physical exercise is one of the most important things you can do for your health. Most adults should get at least 150 minutes of moderate-intensity exercise (any activity that increases your heart rate and causes you to sweat) each week. In addition, most adults need muscle-strengthening exercises on 2 or more days a week.   Maintain a healthy weight. The body mass index (BMI) is a screening tool to identify possible weight problems. It provides an estimate of body fat based on height and weight. Your caregiver can help determine your BMI, and can help you achieve or maintain a healthy weight. For adults 20 years and older:  A BMI below 18.5 is considered underweight.  A BMI of 18.5 to 24.9 is normal.  A BMI of 25 to 29.9 is considered overweight.  A BMI of 30 and above is considered obese.  Maintain normal blood lipids and cholesterol by exercising and minimizing your intake of saturated fat. Eat a balanced diet with plenty of fruits and vegetables.  Blood tests for lipids and cholesterol should begin at age 36 and be repeated every 5 years. If your lipid or cholesterol levels are high, you are over 50, or you are a high risk for heart disease, you may need your cholesterol levels checked more frequently.Ongoing high lipid and cholesterol levels should be treated with medicines if diet and exercise are not effective.  If you smoke, find out from your caregiver how to quit. If you do not use tobacco, do not start.  Lung cancer screening is recommended for adults aged 55-80 years who are at high risk for developing lung cancer because of a history of smoking. Yearly low-dose computed tomography (CT) is recommended for people who have at least a 30-pack-year history of smoking and are a current smoker or have quit within the past 15 years. A pack year of smoking is smoking an average of 1 pack of cigarettes a day for 1 year (for example: 1 pack a day for 30 years or 2 packs a day for 15 years). Yearly screening should continue until the smoker has stopped smoking for at least 15 years. Yearly screening should also be stopped for people who develop a health problem that would prevent them from having lung cancer treatment.  If you are pregnant, do not drink alcohol. If you are breastfeeding, be very cautious about drinking alcohol. If you are not pregnant and choose to drink alcohol, do not exceed 1 drink per day. One drink is considered to be 12 ounces (355 mL) of beer, 5 ounces (148 mL) of  wine, or 1.5 ounces (44 mL) of liquor.  Avoid use of street drugs. Do not share needles with anyone. Ask for help if you need support or instructions about stopping the use of drugs.  High blood pressure causes heart disease and increases the risk of stroke. Blood pressure should be checked at least every 1 to 2 years. Ongoing high blood pressure should be treated with medicines, if weight loss and exercise are not effective.  If you are 66 to 63 years old, ask your  caregiver if you should take aspirin to prevent strokes.  Diabetes screening involves taking a blood sample to check your fasting blood sugar level. This should be done once every 3 years, after age 32, if you are within normal weight and without risk factors for diabetes. Testing should be considered at a younger age or be carried out more frequently if you are overweight and have at least 1 risk factor for diabetes.  Breast cancer screening is essential preventative care for women. You should practice "breast self-awareness." This means understanding the normal appearance and feel of your breasts and may include breast self-examination. Any changes detected, no matter how small, should be reported to a caregiver. Women in their 56s and 30s should have a clinical breast exam (CBE) by a caregiver as part of a regular health exam every 1 to 3 years. After age 55, women should have a CBE every year. Starting at age 56, women should consider having a mammogram (breast X-ray) every year. Women who have a family history of breast cancer should talk to their caregiver about genetic screening. Women at a high risk of breast cancer should talk to their caregiver about having an MRI and a mammogram every year.  Breast cancer gene (BRCA)-related cancer risk assessment is recommended for women who have family members with BRCA-related cancers. BRCA-related cancers include breast, ovarian, tubal, and peritoneal cancers. Having family members with these cancers may be associated with an increased risk for harmful changes (mutations) in the breast cancer genes BRCA1 and BRCA2. Results of the assessment will determine the need for genetic counseling and BRCA1 and BRCA2 testing.  The Pap test is a screening test for cervical cancer. Women should have a Pap test starting at age 14. Between ages 55 and 60, Pap tests should be repeated every 2 years. Beginning at age 42, you should have a Pap test every 3 years as long as the  past 3 Pap tests have been normal. If you had a hysterectomy for a problem that was not cancer or a condition that could lead to cancer, then you no longer need Pap tests. If you are between ages 12 and 5, and you have had normal Pap tests going back 10 years, you no longer need Pap tests. If you have had past treatment for cervical cancer or a condition that could lead to cancer, you need Pap tests and screening for cancer for at least 20 years after your treatment. If Pap tests have been discontinued, risk factors (such as a new sexual partner) need to be reassessed to determine if screening should be resumed. Some women have medical problems that increase the chance of getting cervical cancer. In these cases, your caregiver may recommend more frequent screening and Pap tests.  The human papillomavirus (HPV) test is an additional test that may be used for cervical cancer screening. The HPV test looks for the virus that can cause the cell changes on the cervix. The cells collected during  the Pap test can be tested for HPV. The HPV test could be used to screen women aged 40 years and older, and should be used in women of any age who have unclear Pap test results. After the age of 76, women should have HPV testing at the same frequency as a Pap test.  Colorectal cancer can be detected and often prevented. Most routine colorectal cancer screening begins at the age of 87 and continues through age 65. However, your caregiver may recommend screening at an earlier age if you have risk factors for colon cancer. On a yearly basis, your caregiver may provide home test kits to check for hidden blood in the stool. Use of a small camera at the end of a tube, to directly examine the colon (sigmoidoscopy or colonoscopy), can detect the earliest forms of colorectal cancer. Talk to your caregiver about this at age 62, when routine screening begins. Direct examination of the colon should be repeated every 5 to 10 years through  age 43, unless early forms of pre-cancerous polyps or small growths are found.  Hepatitis C blood testing is recommended for all people born from [birth year] through 1965 and any individual with known risks for hepatitis C.  Practice safe sex. Use condoms and avoid high-risk sexual practices to reduce the spread of sexually transmitted infections (STIs). Sexually active women aged 22 and younger should be checked for Chlamydia, which is a common sexually transmitted infection. Older women with new or multiple partners should also be tested for Chlamydia. Testing for other STIs is recommended if you are sexually active and at increased risk.  Osteoporosis is a disease in which the bones lose minerals and strength with aging. This can result in serious bone fractures. The risk of osteoporosis can be identified using a bone density scan. Women ages 47 and over and women at risk for fractures or osteoporosis should discuss screening with their caregivers. Ask your caregiver whether you should be taking a calcium supplement or vitamin D to reduce the rate of osteoporosis.  Menopause can be associated with physical symptoms and risks. Hormone replacement therapy is available to decrease symptoms and risks. You should talk to your caregiver about whether hormone replacement therapy is right for you.  Use sunscreen. Apply sunscreen liberally and repeatedly throughout the day. You should seek shade when your shadow is shorter than you. Protect yourself by wearing long sleeves, pants, a wide-brimmed hat, and sunglasses year round, whenever you are outdoors.  Notify your caregiver of new moles or changes in moles, especially if there is a change in shape or color. Also notify your caregiver if a mole is larger than the size of a pencil eraser.  Stay current with your immunizations. Document Released: 04/15/2011 Document Revised: 01/25/2013 Document Reviewed: 09/01/2013 [facility] Patient Information 2015 [place],  [place]. This information is not intended to replace advice given to you by your health care provider. Make sure you discuss any questions you have with your health care provider.

## 2014-04-27 NOTE — Progress Notes (Signed)
Patient ID: Taylor [name], female   DOB: 1950-11-09, 63 y.o.   MRN: [id number]    Subjective:     Taylor [name] is a 63 y.o. female and is here for a comprehensive physical exam. The patient reports no problems.  She continues  To have  bouts of IBS,  May want to try bentyl.  Currently  using questran   Was treated for Otitis media and  Otitis externa by ENT several months ago. Symptoms have all resolved.    History   Social History  . Marital Status: Married    Spouse Name: N/A    Number of Children: N/A  . Years of Education: N/A   Occupational History  . Not on file.   Social History Main Topics  . Smoking status: Never Smoker   . Smokeless tobacco: Never Used  . Alcohol Use: 1.8 oz/week    3 Glasses of wine per week  . Drug Use: No  . Sexual Activity: Not on file   Other Topics Concern  . Not on file   Social History Narrative  . No narrative on file   Health Maintenance  Topic Date Due  . Influenza Vaccine  05/14/2014  . Pap Smear  04/28/2015  . Mammogram  05/05/2015  . Colonoscopy  03/03/2023  . Tetanus/tdap  04/28/2023  . Zostavax  Completed    The following portions of the patient's history were reviewed and updated as appropriate: allergies, current medications, past family history, past medical history, past social history, past surgical history and problem list.  Review of Systems A comprehensive review of systems was negative.   Objective:   BP 118/78  Pulse 82  Temp(Src) 97.7 F (36.5 C) (Oral)  Resp 16  Ht 5' 4.5" (1.638 m)  Wt 117 lb 8 oz (53.298 kg)  BMI 19.86 kg/m2  SpO2 97%  General appearance: alert, cooperative and appears stated age Head: Normocephalic, without obvious abnormality, atraumatic Eyes: conjunctivae/corneas clear. PERRL, EOM's intact. Fundi benign. Ears: normal TM's and external ear canals both ears Nose: Nares normal. Septum midline. Mucosa normal. No drainage or sinus tenderness. Throat: lips, mucosa, and tongue normal;  teeth and gums normal Neck: no adenopathy, no carotid bruit, no JVD, supple, symmetrical, trachea midline and thyroid not enlarged, symmetric, no tenderness/mass/nodules Lungs: clear to auscultation bilaterally Breasts: normal appearance, no masses or tenderness Heart: regular rate and rhythm, S1, S2 normal, no murmur, click, rub or gallop Abdomen: soft, non-tender; bowel sounds normal; no masses,  no organomegaly Extremities: extremities normal, atraumatic, no cyanosis or edema Pulses: 2+ and symmetric Skin: Skin color, texture, turgor normal. No rashes or lesions Neurologic: Alert and oriented X 3, normal strength and tone. Normal symmetric reflexes. Normal coordination and gait.   .    Assessment and plan:   Osteoporosis Discussed  A trial of monthly Boniva given her progression of disease without treatment. If she develops esophageal pain witll obtain PA for Prolia   Chronic right hip pain Plain films were normal,  Bursitis treated.   Routine general medical examination at a health care facility Annual comprehensive exam was done including breast, excluding pelvic and PAP smear. All screenings have been addressed .    Updated Medication List Outpatient Encounter Prescriptions as of 04/27/2014  Medication Sig  . azithromycin (ZITHROMAX) 250 MG tablet Take 2 tablets today then take 1 tablet daily for the next 4 days.  . Calcium-Vitamin D-Vitamin K (VIACTIV PO) Take as directed  . cholestyramine (QUESTRAN) 4 GM/DOSE  powder Take 1 packet (4 g total) by mouth 3 (three) times daily with meals.  . CRESTOR 10 MG tablet TAKE ONE TABLET EACH NIGHT  . fluticasone (FLONASE) 50 MCG/ACT nasal spray Place 2 sprays into both nostrils daily.  Marland Kitchen. omeprazole (PRILOSEC) 40 MG capsule TAKE ONE CAPSULE DAILY USUALLY 30 MINUTES BEFORE BREAKFAST  . VITAMIN D, CHOLECALCIFEROL, PO Take one by mouth daily

## 2014-04-27 NOTE — Progress Notes (Signed)
Pre-visit discussion using our clinic review tool. No additional management support is needed unless otherwise documented below in the visit note.  

## 2014-04-29 ENCOUNTER — Encounter: Payer: Self-pay | Admitting: Internal Medicine

## 2014-04-30 MED ORDER — IBANDRONATE SODIUM 150 MG PO TABS: 150.0000 mg | ORAL_TABLET | ORAL | Status: DC

## 2014-04-30 NOTE — Assessment & Plan Note (Signed)
Annual comprehensive exam was done including breast, excluding pelvic and PAP smear. All screenings have been addressed .  

## 2014-04-30 NOTE — Assessment & Plan Note (Addendum)
Plain films were normal,  Bursitis treated.

## 2014-05-06 ENCOUNTER — Telehealth: Payer: Self-pay | Admitting: Internal Medicine

## 2014-05-06 NOTE — Telephone Encounter (Signed)
LMTCB for mammo appt details/msn

## 2014-05-18 ENCOUNTER — Ambulatory Visit
Admission: RE | Admit: 2014-05-18 | Discharge: 2014-05-18 | Disposition: A | Discharge status: Home / Self Care | Payer: No Typology Code available for payment source | Source: Ambulatory Visit | Attending: Internal Medicine | Admitting: Internal Medicine

## 2014-05-18 DIAGNOSIS — Z1239 Encounter for other screening for malignant neoplasm of breast: Secondary | ICD-10-CM

## 2014-09-01 ENCOUNTER — Other Ambulatory Visit: Payer: Self-pay | Admitting: Internal Medicine

## 2014-11-18 ENCOUNTER — Encounter: Payer: Self-pay | Admitting: Internal Medicine

## 2014-11-22 ENCOUNTER — Encounter: Payer: Self-pay | Admitting: Nurse Practitioner

## 2014-11-22 ENCOUNTER — Ambulatory Visit (INDEPENDENT_AMBULATORY_CARE_PROVIDER_SITE_OTHER): Payer: No Typology Code available for payment source | Admitting: Nurse Practitioner

## 2014-11-22 VITALS — BP 116/78 | HR 78 | Temp 98.2°F | Resp 14 | Ht 64.5 in | Wt 118.4 lb

## 2014-11-22 DIAGNOSIS — B0229 Other postherpetic nervous system involvement: Secondary | ICD-10-CM

## 2014-11-22 MED ORDER — GABAPENTIN 300 MG PO CAPS: 300.0000 mg | ORAL_CAPSULE | Freq: Three times a day (TID) | ORAL | Status: DC

## 2014-11-22 MED ORDER — TRAMADOL HCL 50 MG PO TABS: 50.0000 mg | ORAL_TABLET | Freq: Two times a day (BID) | ORAL | Status: DC

## 2014-11-22 NOTE — Patient Instructions (Addendum)
Postherpetic Neuralgia Postherpetic neuralgia (PHN) is nerve pain that occurs after a shingles infection. Shingles is a painful rash that appears on one side of the body, usually on your trunk or face. Shingles is caused by the varicella-zoster virus. This is the same virus that causes chickenpox. In people who have had chickenpox, the virus can resurface years later and cause shingles. You may have PHN if you continue to have pain for 3 months after your shingles rash has gone away. PHN appears in the same area where you had the shingles rash. For most people, PHN goes away within 1 year.  Getting a vaccination for shingles can prevent PHN. This vaccine is recommended for people older than 50. It may prevent shingles and may also lower your risk of PHN if you do get shingles. CAUSES PHN is caused by damage to your nerves from the varicella-zoster virus. This damage makes your nerves overly sensitive.  RISK FACTORS Aging is the biggest risk factor for developing PHN. Most people who get PHN are older than 60. Other risk factors include:  Having very bad pain before your shingles rash starts.  Having a very bad rash.  Having shingles in the nerve that supplies your face and eye (trigeminal nerve). SIGNS AND SYMPTOMS Pain is the main symptom of PHN. The pain is often very bad and may be described as stabbing, burning, or feeling like an electric shock. The pain may come and go or may be there all the time. Pain may be triggered by light touches on the skin or changes in temperature. You may have itching along with the pain. DIAGNOSIS  Your health care provider may diagnose PHN based on your symptoms and your history of shingles. Lab studies and other diagnostic tests are usually not needed. TREATMENT  There is no cure for PHN. Treatment for PHN will focus on pain relief. Over-the-counter pain relievers do not usually relieve PHN pain. You may need to work with a pain specialist. Treatment may  include:  Antidepressant medicines to help with pain and improve sleep.  Antiseizure medicines to relieve nerve pain.  Strong pain relievers (opioids).  A numbing patch worn on the skin (lidocaine patch). HOME CARE INSTRUCTIONS It may take a long time to recover from PHN. Work closely with your health care provider, and have a good support system at home.   Take all medicines as directed by your health care provider.  Wear loose, comfortable clothing.  Cover sensitive areas with a dressing to reduce friction from clothing rubbing on the area.  If cold does not make your pain worse, try applying a cool compress or cooling gel pack to the area.  Talk to your health care provider if you feel depressed or desperate. Living with long-term pain can be depressing. SEEK MEDICAL CARE IF:  Your medicine is not helping.  You are struggling to manage your pain at home. Document Released: 12/21/2002 Document Revised: 02/14/2014 Document Reviewed: 09/21/2013 [employer] Patient Information 2015 [employer]. This information is not intended to replace advice given to you by your health care provider. Make sure you discuss any questions you have with your health care provider.  

## 2014-11-22 NOTE — Progress Notes (Signed)
Pre visit review using our clinic review tool, if applicable. No additional management support is needed unless otherwise documented below in the visit note. 

## 2014-11-22 NOTE — Assessment & Plan Note (Signed)
Worsening. Suspect PHN vs. Costochondritis. Script ultram 50 mg twice daily. Gabapentin 300 mg at night, can work up to 1 extra during the day every 7 days until 3 a day. FU worsening/failure to improve. Asked pt to call us if not improving in a week.

## 2014-11-22 NOTE — Progress Notes (Signed)
Subjective:    Patient ID: Taylor [name], female    DOB: 1951/03/04, 64 y.o.   MRN: [id number]  HPI  Ms. July is a 64 yo female with a CC of right rib pain x 2 weeks s/p coughing for 2 months.   1) Augmentin twice (Dec.), rash at spot of pain, doxycyline just finished 2 weeks ago. Lying down and bending forward worse pain only on right side of rib cage, Sharp pain and shooting pains when taking a deep breath. Happens constantly for 2 weeks.  Rash is only on right, follows rib cage to back and does not pass midline.  Ice and Aleve- not helpful  Cannot turn on right side at night time.   Review of Systems  Constitutional: Negative for fever, chills, diaphoresis and fatigue.  Eyes: Negative for visual disturbance.  Respiratory: Negative for cough, chest tightness, shortness of breath and wheezing.   Cardiovascular: Negative for chest pain, palpitations and leg swelling.  Gastrointestinal: Negative for nausea, vomiting, diarrhea and rectal pain.  Musculoskeletal: Positive for arthralgias.  Skin: Positive for rash.  Neurological: Negative for dizziness, weakness, numbness and headaches.  Psychiatric/Behavioral: The patient is not nervous/anxious.    Past Medical History  Diagnosis Date  . Normal cardiac stress test 2007  . Irritable bowel syndrome   . Mitral valve prolapse   . Chest pain   . Fever blister   . Hyperlipidemia   . Osteoporosis     History   Social History  . Marital Status: Married    Spouse Name: N/A    Number of Children: N/A  . Years of Education: N/A   Occupational History  . Not on file.   Social History Main Topics  . Smoking status: Never Smoker   . Smokeless tobacco: Never Used  . Alcohol Use: 1.8 oz/week    3 Glasses of wine per week  . Drug Use: No  . Sexual Activity: Not on file   Other Topics Concern  . Not on file   Social History Narrative    Past Surgical History  Procedure Laterality Date  . Cholecystectomy  Approx - 2002     Family History  Problem Relation Age of Onset  . Heart disease Father 82    massive AMI, now post CABG  . Heart disease Brother 41    CABG  . Multiple sclerosis Mother     No Known Allergies  Current Outpatient Prescriptions on File Prior to Visit  Medication Sig Dispense Refill  . Calcium-Vitamin D-Vitamin K (VIACTIV PO) Take as directed    . cholestyramine (QUESTRAN) 4 GM/DOSE powder Take 1 packet (4 g total) by mouth 3 (three) times daily with meals. 378 g 12  . CRESTOR 10 MG tablet TAKE ONE TABLET EACH NIGHT 30 tablet 5  . fluticasone (FLONASE) 50 MCG/ACT nasal spray Place 2 sprays into both nostrils daily. 16 g 6  . ibandronate (BONIVA) 150 MG tablet Take 1 tablet (150 mg total) by mouth every 30 (thirty) days. Take in the morning with a full glass of water, on an empty stomach, and do not take anything else by mouth or lie down for the next 30 min. 1 tablet 11  . omeprazole (PRILOSEC) 40 MG capsule TAKE ONE CAPSULE DAILY USUALLY 30 MINUTES BEFORE BREAKFAST 30 capsule 5  . VITAMIN D, CHOLECALCIFEROL, PO Take one by mouth daily     No current facility-administered medications on file prior to visit.      Objective:  Physical Exam  Constitutional: She is oriented to person, place, and time. She appears well-developed and well-nourished. No distress.  BP 116/78 mmHg  Pulse 78  Temp(Src) 98.2 F (36.8 C)  Resp 14  Ht 5' 4.5" (1.638 m)  Wt 118 lb 6.4 oz (53.706 kg)  BMI 20.02 kg/m2  SpO2 97%   HENT:  Head: Normocephalic and atraumatic.  Right Ear: External ear normal.  Left Ear: External ear normal.  Eyes: Right eye exhibits no discharge. Left eye exhibits no discharge. No scleral icterus.  Neck: Normal range of motion. Neck supple.  Cardiovascular: Normal rate, regular rhythm, normal heart sounds and intact distal pulses.  Exam reveals no gallop and no friction rub.   No murmur heard. Pulmonary/Chest: Effort normal and breath sounds normal. No respiratory  distress. She has no wheezes. She has no rales. She exhibits tenderness.  Very tender to palpation of right rib area wrapping to back.   Musculoskeletal: She exhibits tenderness. She exhibits no edema.  See chest tenderness  Lymphadenopathy:    She has no cervical adenopathy.  Neurological: She is alert and oriented to person, place, and time. No cranial nerve deficit. She exhibits normal muscle tone. Coordination normal.  Skin: Skin is warm and dry. No rash noted. She is not diaphoretic.     Psychiatric: She has a normal mood and affect. Her behavior is normal. Judgment and thought content normal.      Assessment & Plan:

## 2014-12-22 ENCOUNTER — Other Ambulatory Visit: Payer: Self-pay | Admitting: Internal Medicine

## 2015-01-30 ENCOUNTER — Other Ambulatory Visit: Payer: Self-pay | Admitting: Internal Medicine

## 2015-05-01 ENCOUNTER — Encounter: Payer: Self-pay | Admitting: Internal Medicine

## 2015-05-01 ENCOUNTER — Ambulatory Visit (INDEPENDENT_AMBULATORY_CARE_PROVIDER_SITE_OTHER): Payer: 59 | Admitting: Internal Medicine

## 2015-05-01 ENCOUNTER — Other Ambulatory Visit (HOSPITAL_COMMUNITY)
Admission: RE | Admit: 2015-05-01 | Discharge: 2015-05-01 | Disposition: A | Discharge status: Home / Self Care | Payer: 59 | Source: Ambulatory Visit | Attending: Internal Medicine | Admitting: Internal Medicine

## 2015-05-01 VITALS — BP 104/68 | HR 73 | Temp 97.4°F | Resp 12 | Ht 64.5 in | Wt 120.8 lb

## 2015-05-01 DIAGNOSIS — Z1151 Encounter for screening for human papillomavirus (HPV): Secondary | ICD-10-CM | POA: Diagnosis present

## 2015-05-01 DIAGNOSIS — M810 Age-related osteoporosis without current pathological fracture: Secondary | ICD-10-CM | POA: Diagnosis not present

## 2015-05-01 DIAGNOSIS — R5383 Other fatigue: Secondary | ICD-10-CM | POA: Diagnosis not present

## 2015-05-01 DIAGNOSIS — Z01419 Encounter for gynecological examination (general) (routine) without abnormal findings: Secondary | ICD-10-CM | POA: Diagnosis not present

## 2015-05-01 DIAGNOSIS — Z1159 Encounter for screening for other viral diseases: Secondary | ICD-10-CM | POA: Diagnosis not present

## 2015-05-01 DIAGNOSIS — Z1239 Encounter for other screening for malignant neoplasm of breast: Secondary | ICD-10-CM | POA: Diagnosis not present

## 2015-05-01 DIAGNOSIS — E559 Vitamin D deficiency, unspecified: Secondary | ICD-10-CM

## 2015-05-01 DIAGNOSIS — K915 Postcholecystectomy syndrome: Secondary | ICD-10-CM

## 2015-05-01 DIAGNOSIS — E785 Hyperlipidemia, unspecified: Secondary | ICD-10-CM | POA: Diagnosis not present

## 2015-05-01 DIAGNOSIS — Z0000 Encounter for general adult medical examination without abnormal findings: Secondary | ICD-10-CM

## 2015-05-01 DIAGNOSIS — Z124 Encounter for screening for malignant neoplasm of cervix: Secondary | ICD-10-CM

## 2015-05-01 LAB — CBC WITH DIFFERENTIAL/PLATELET
Basophils Absolute: 0 10*3/uL (ref 0.0–0.1)
Basophils Relative: 0.5 % (ref 0.0–3.0)
Eosinophils Absolute: 0.1 10*3/uL (ref 0.0–0.7)
Eosinophils Relative: 1.7 % (ref 0.0–5.0)
HCT: 40.9 % (ref 36.0–46.0)
Hemoglobin: 13.6 g/dL (ref 12.0–15.0)
Lymphocytes Relative: 38.5 % (ref 12.0–46.0)
Lymphs Abs: 2.5 10*3/uL (ref 0.7–4.0)
MCHC: 33.3 g/dL (ref 30.0–36.0)
MCV: 93.2 fl (ref 78.0–100.0)
Monocytes Absolute: 0.7 10*3/uL (ref 0.1–1.0)
Monocytes Relative: 10.8 % (ref 3.0–12.0)
Neutro Abs: 3.2 10*3/uL (ref 1.4–7.7)
Neutrophils Relative %: 48.5 % (ref 43.0–77.0)
Platelets: 282 10*3/uL (ref 150.0–400.0)
RBC: 4.39 Mil/uL (ref 3.87–5.11)
RDW: 14.4 % (ref 11.5–15.5)
WBC: 6.5 10*3/uL (ref 4.0–10.5)

## 2015-05-01 LAB — COMPREHENSIVE METABOLIC PANEL
ALT: 13 U/L (ref 0–35)
AST: 22 U/L (ref 0–37)
Albumin: 4.3 g/dL (ref 3.5–5.2)
Alkaline Phosphatase: 71 U/L (ref 39–117)
BUN: 16 mg/dL (ref 6–23)
CO2: 29 mEq/L (ref 19–32)
Calcium: 9.8 mg/dL (ref 8.4–10.5)
Chloride: 103 mEq/L (ref 96–112)
Creatinine, Ser: 0.9 mg/dL (ref 0.40–1.20)
GFR: 66.99 mL/min (ref >60.00)
Glucose, Bld: 84 mg/dL (ref 70–99)
Potassium: 4.5 mEq/L (ref 3.5–5.1)
Sodium: 138 mEq/L (ref 135–145)
Total Bilirubin: 0.7 mg/dL (ref 0.2–1.2)
Total Protein: 7.6 g/dL (ref 6.0–8.3)

## 2015-05-01 LAB — LIPID PANEL
Cholesterol: 193 mg/dL (ref 0–200)
HDL: 78.5 mg/dL (ref >39.00)
LDL Cholesterol: 82 mg/dL (ref 0–99)
NonHDL: 114.5
Total CHOL/HDL Ratio: 2
Triglycerides: 165 mg/dL — ABNORMAL HIGH (ref 0.0–149.0)
VLDL: 33 mg/dL (ref 0.0–40.0)

## 2015-05-01 LAB — VITAMIN D 25 HYDROXY (VIT D DEFICIENCY, FRACTURES): VITD: 37.48 ng/mL (ref 30.00–100.00)

## 2015-05-01 LAB — TSH: TSH: 2.92 u[IU]/mL (ref 0.35–4.50)

## 2015-05-01 MED ORDER — CHOLESTYRAMINE 4 GM/DOSE PO POWD: ORAL | Status: DC

## 2015-05-01 MED ORDER — ROSUVASTATIN CALCIUM 10 MG PO TABS: ORAL_TABLET | ORAL | Status: DC

## 2015-05-01 NOTE — Assessment & Plan Note (Addendum)
Diagnosed in 2011.  She did not tolerate recent trial of Boniva due to persistent nausea, and had pill esophagitis with prior trial of alendronate in 2013.  T scores are unchanged by 2014 DEXA .   Needs PROLIA AUTHORIZATION

## 2015-05-01 NOTE — Patient Instructions (Signed)
YOU CAN CHANGE FROM PRILOSEC TO FAMOTIDINE 20 MG ONCE OR TWICE DAILY TO PREVENT GASTRITIS      Health Maintenance Adopting a healthy lifestyle and getting preventive care can go a long way to promote health and wellness. Talk with your health care provider about what schedule of regular examinations is right for you. This is a good chance for you to check in with your provider about disease prevention and staying healthy. In between checkups, there are plenty of things you can do on your own. Experts have done a lot of research about which lifestyle changes and preventive measures are most likely to keep you healthy. Ask your health care provider for more information. WEIGHT AND DIET  Eat a healthy diet  Be sure to include plenty of vegetables, fruits, low-fat dairy products, and lean protein.  Do not eat a lot of foods high in solid fats, added sugars, or salt.  Get regular exercise. This is one of the most important things you can do for your health.  Most adults should exercise for at least 150 minutes each week. The exercise should increase your heart rate and make you sweat (moderate-intensity exercise).  Most adults should also do strengthening exercises at least twice a week. This is in addition to the moderate-intensity exercise.  Maintain a healthy weight  Body mass index (BMI) is a measurement that can be used to identify possible weight problems. It estimates body fat based on height and weight. Your health care provider can help determine your BMI and help you achieve or maintain a healthy weight.  For females 82 years of age and older:   A BMI below 18.5 is considered underweight.  A BMI of 18.5 to 24.9 is normal.  A BMI of 25 to 29.9 is considered overweight.  A BMI of 30 and above is considered obese.  Watch levels of cholesterol and blood lipids  You should start having your blood tested for lipids and cholesterol at 63 years of age, then have this test every 5  years.  You may need to have your cholesterol levels checked more often if:  Your lipid or cholesterol levels are high.  You are older than 64 years of age.  You are at high risk for heart disease.  CANCER SCREENING   Lung Cancer  Lung cancer screening is recommended for adults 46-49 years old who are at high risk for lung cancer because of a history of smoking.  A yearly low-dose CT scan of the lungs is recommended for people who:  Currently smoke.  Have quit within the past 15 years.  Have at least a 30-pack-year history of smoking. A pack year is smoking an average of one pack of cigarettes a day for 1 year.  Yearly screening should continue until it has been 15 years since you quit.  Yearly screening should stop if you develop a health problem that would prevent you from having lung cancer treatment.  Breast Cancer  Practice breast self-awareness. This means understanding how your breasts normally appear and feel.  It also means doing regular breast self-exams. Let your health care provider know about any changes, no matter how small.  If you are in your 20s or 30s, you should have a clinical breast exam (CBE) by a health care provider every 1-3 years as part of a regular health exam.  If you are 67 or older, have a CBE every year. Also consider having a breast X-ray (mammogram) every year.  If  you have a family history of breast cancer, talk to your health care provider about genetic screening.  If you are at high risk for breast cancer, talk to your health care provider about having an MRI and a mammogram every year.  Breast cancer gene (BRCA) assessment is recommended for women who have family members with BRCA-related cancers. BRCA-related cancers include:  Breast.  Ovarian.  Tubal.  Peritoneal cancers.  Results of the assessment will determine the need for genetic counseling and BRCA1 and BRCA2 testing. Cervical Cancer Routine pelvic examinations to  screen for cervical cancer are no longer recommended for nonpregnant women who are considered low risk for cancer of the pelvic organs (ovaries, uterus, and vagina) and who do not have symptoms. A pelvic examination may be necessary if you have symptoms including those associated with pelvic infections. Ask your health care provider if a screening pelvic exam is right for you.   The Pap test is the screening test for cervical cancer for women who are considered at risk.  If you had a hysterectomy for a problem that was not cancer or a condition that could lead to cancer, then you no longer need Pap tests.  If you are older than 65 years, and you have had normal Pap tests for the past 10 years, you no longer need to have Pap tests.  If you have had past treatment for cervical cancer or a condition that could lead to cancer, you need Pap tests and screening for cancer for at least 20 years after your treatment.  If you no longer get a Pap test, assess your risk factors if they change (such as having a new sexual partner). This can affect whether you should start being screened again.  Some women have medical problems that increase their chance of getting cervical cancer. If this is the case for you, your health care provider may recommend more frequent screening and Pap tests.  The human papillomavirus (HPV) test is another test that may be used for cervical cancer screening. The HPV test looks for the virus that can cause cell changes in the cervix. The cells collected during the Pap test can be tested for HPV.  The HPV test can be used to screen women 10 years of age and older. Getting tested for HPV can extend the interval between normal Pap tests from three to five years.  An HPV test also should be used to screen women of any age who have unclear Pap test results.  After 64 years of age, women should have HPV testing as often as Pap tests.  Colorectal Cancer  This type of cancer can be  detected and often prevented.  Routine colorectal cancer screening usually begins at 64 years of age and continues through 64 years of age.  Your health care provider may recommend screening at an earlier age if you have risk factors for colon cancer.  Your health care provider may also recommend using home test kits to check for hidden blood in the stool.  A small camera at the end of a tube can be used to examine your colon directly (sigmoidoscopy or colonoscopy). This is done to check for the earliest forms of colorectal cancer.  Routine screening usually begins at age 83.  Direct examination of the colon should be repeated every 5-10 years through 64 years of age. However, you may need to be screened more often if early forms of precancerous polyps or small growths are found. Skin Cancer  Check your skin from head to toe regularly.  Tell your health care provider about any new moles or changes in moles, especially if there is a change in a mole's shape or color.  Also tell your health care provider if you have a mole that is larger than the size of a pencil eraser.  Always use sunscreen. Apply sunscreen liberally and repeatedly throughout the day.  Protect yourself by wearing long sleeves, pants, a wide-brimmed hat, and sunglasses whenever you are outside. HEART DISEASE, DIABETES, AND HIGH BLOOD PRESSURE   Have your blood pressure checked at least every 1-2 years. High blood pressure causes heart disease and increases the risk of stroke.  If you are between 51 years and 36 years old, ask your health care provider if you should take aspirin to prevent strokes.  Have regular diabetes screenings. This involves taking a blood sample to check your fasting blood sugar level.  If you are at a normal weight and have a low risk for diabetes, have this test once every three years after 64 years of age.  If you are overweight and have a high risk for diabetes, consider being tested at a  younger age or more often. PREVENTING INFECTION  Hepatitis B  If you have a higher risk for hepatitis B, you should be screened for this virus. You are considered at high risk for hepatitis B if:  You were born in a country where hepatitis B is common. Ask your health care provider which countries are considered high risk.  Your parents were born in a high-risk country, and you have not been immunized against hepatitis B (hepatitis B vaccine).  You have HIV or AIDS.  You use needles to inject street drugs.  You live with someone who has hepatitis B.  You have had sex with someone who has hepatitis B.  You get hemodialysis treatment.  You take certain medicines for conditions, including cancer, organ transplantation, and autoimmune conditions. Hepatitis C  Blood testing is recommended for:  Everyone born from [birth year] through 1965.  Anyone with known risk factors for hepatitis C. Sexually transmitted infections (STIs)  You should be screened for sexually transmitted infections (STIs) including gonorrhea and chlamydia if:  You are sexually active and are younger than 64 years of age.  You are older than 64 years of age and your health care provider tells you that you are at risk for this type of infection.  Your sexual activity has changed since you were last screened and you are at an increased risk for chlamydia or gonorrhea. Ask your health care provider if you are at risk.  If you do not have HIV, but are at risk, it may be recommended that you take a prescription medicine daily to prevent HIV infection. This is called pre-exposure prophylaxis (PrEP). You are considered at risk if:  You are sexually active and do not regularly use condoms or know the HIV status of your partner(s).  You take drugs by injection.  You are sexually active with a partner who has HIV. Talk with your health care provider about whether you are at high risk of being infected with HIV. If you choose  to begin PrEP, you should first be tested for HIV. You should then be tested every 3 months for as long as you are taking PrEP.  PREGNANCY   If you are premenopausal and you may become pregnant, ask your health care provider about preconception counseling.  If you may become pregnant,  take 400 to 800 micrograms (mcg) of folic acid every day.  If you want to prevent pregnancy, talk to your health care provider about birth control (contraception). OSTEOPOROSIS AND MENOPAUSE   Osteoporosis is a disease in which the bones lose minerals and strength with aging. This can result in serious bone fractures. Your risk for osteoporosis can be identified using a bone density scan.  If you are 36 years of age or older, or if you are at risk for osteoporosis and fractures, ask your health care provider if you should be screened.  Ask your health care provider whether you should take a calcium or vitamin D supplement to lower your risk for osteoporosis.  Menopause may have certain physical symptoms and risks.  Hormone replacement therapy may reduce some of these symptoms and risks. Talk to your health care provider about whether hormone replacement therapy is right for you.  HOME CARE INSTRUCTIONS   Schedule regular health, dental, and eye exams.  Stay current with your immunizations.   Do not use any tobacco products including cigarettes, chewing tobacco, or electronic cigarettes.  If you are pregnant, do not drink alcohol.  If you are breastfeeding, limit how much and how often you drink alcohol.  Limit alcohol intake to no more than 1 drink per day for nonpregnant women. One drink equals 12 ounces of beer, 5 ounces of wine, or 1 ounces of hard liquor.  Do not use street drugs.  Do not share needles.  Ask your health care provider for help if you need support or information about quitting drugs.  Tell your health care provider if you often feel depressed.  Tell your health care  provider if you have ever been abused or do not feel safe at home. Document Released: 04/15/2011 Document Revised: 02/14/2014 Document Reviewed: 09/01/2013 [facility] Patient Information 2015 [place], [place]. This information is not intended to replace advice given to you by your health care provider. Make sure you discuss any questions you have with your health care provider. DINE 20 MG ONCE OR TWICE DAILY TO PREVENT GASTRITIS

## 2015-05-01 NOTE — Progress Notes (Signed)
Pre-visit discussion using our clinic review tool. No additional management support is needed unless otherwise documented below in the visit note.  

## 2015-05-02 ENCOUNTER — Encounter: Payer: Self-pay | Admitting: Internal Medicine

## 2015-05-02 DIAGNOSIS — E785 Hyperlipidemia, unspecified: Secondary | ICD-10-CM | POA: Insufficient documentation

## 2015-05-02 LAB — CYTOLOGY - PAP

## 2015-05-02 LAB — HEPATITIS C ANTIBODY: HCV Ab: NEGATIVE

## 2015-05-02 NOTE — Assessment & Plan Note (Signed)
LDL and triglycerides are at goal on current medications. She has no side effects and liver enzymes are normal. No changes today.  Lab Results  Component Value Date   CHOL 193 05/01/2015   HDL 78.50 05/01/2015   LDLCALC 82 05/01/2015   LDLDIRECT 122.6 04/23/2013   TRIG 165.0* 05/01/2015   CHOLHDL 2 05/01/2015

## 2015-05-02 NOTE — Assessment & Plan Note (Signed)
Improved with use of Questran.

## 2015-05-02 NOTE — Progress Notes (Signed)
Patient ID: Olive BassLee B Latimore, female    DOB: 05/17/1951  Age: 64 y.o. MRN: [id number]  The patient is here for annual wellness examination and management of other chronic and acute problems.   The risk factors are reflected in the social history.  The roster of all physicians providing medical care to patient - is listed in the Snapshot section of the chart.  Activities of daily living:  The patient is 100% independent in all ADLs: dressing, toileting, feeding as well as independent mobility  Home safety : The patient has smoke detectors in the home. They wear seatbelts.  There are no firearms at home. There is no violence in the home.   There is no risks for hepatitis, STDs or HIV. There is no   history of blood transfusion. They have no travel history to infectious disease endemic areas of the world.  The patient has seen their dentist in the last six month. They have seen their eye doctor in the last year. They admit to slight hearing difficulty with regard to whispered voices and some television programs.  They have deferred audiologic testing in the last year.  They do not  have excessive sun exposure. Discussed the need for sun protection: hats, long sleeves and use of sunscreen if there is significant sun exposure.   Diet: the importance of a healthy diet is discussed. They do have a healthy diet.  The benefits of regular aerobic exercise were discussed. She walks 4 times per week ,  20 minutes.   Depression screen: there are no signs or vegative symptoms of depression- irritability, change in appetite, anhedonia, sadness/tearfullness.  Cognitive assessment: the patient manages all their financial and personal affairs and is actively engaged. They could relate day,date,year and events; recalled 2/3 objects at 3 minutes; performed clock-face test normally.  The following portions of the patient's history were reviewed and updated as appropriate: allergies, current medications, past family  history, past medical history,  past surgical history, past social history  and problem list.  Visual acuity was not assessed per patient preference since she has regular follow up with her ophthalmologist. Hearing and body mass index were assessed and reviewed.   During the course of the visit the patient was educated and counseled about appropriate screening and preventive services including : fall prevention , diabetes screening, nutrition counseling, colorectal cancer screening, and recommended immunizations.    CC: The primary encounter diagnosis was Osteoporosis. Diagnoses of Breast cancer screening, Need for hepatitis C screening test, Vitamin D deficiency, Hyperlipidemia, Other fatigue, Screening for cervical cancer, Hyperlipidemia LDL goal <160, Post-cholecystectomy syndrome, and Visit for preventive health examination were also pertinent to this visit.  History Nedra HaiLee has a past medical history of Normal cardiac stress test (2007); Irritable bowel syndrome; Mitral valve prolapse; Chest pain; Fever blister; Hyperlipidemia; and Osteoporosis.   She has past surgical history that includes Cholecystectomy (Approx - 2002).   Her family history includes Heart disease (age of onset: 5939) in her father; Heart disease (age of onset: 5347) in her brother; Multiple sclerosis in her mother.She reports that she has never smoked. She has never used smokeless tobacco. She reports that she drinks about 1.8 oz of alcohol per week. She reports that she does not use illicit drugs.  Outpatient Prescriptions Prior to Visit  Medication Sig Dispense Refill  . Calcium-Vitamin D-Vitamin K (VIACTIV PO) Take as directed    . fluticasone (FLONASE) 50 MCG/ACT nasal spray Place 2 sprays into both nostrils daily. 16 g  6  . omeprazole (PRILOSEC) 40 MG capsule TAKE ONE CAPSULE DAILY USUALLY 30 MINUTES BEFORE BREAKFAST 30 capsule 5  . VITAMIN D, CHOLECALCIFEROL, PO Take one by mouth daily    . cholestyramine (QUESTRAN) 4  GM/DOSE powder USE AS PER PACKAGE INSTRUCTIONS TAKE ONESCOOPFUL THREE TIMES A DAY WITH MEALS 378 g 6  . CRESTOR 10 MG tablet TAKE ONE TABLET EACH NIGHT 30 tablet 0  . ibandronate (BONIVA) 150 MG tablet Take 1 tablet (150 mg total) by mouth every 30 (thirty) days. Take in the morning with a full glass of water, on an empty stomach, and do not take anything else by mouth or lie down for the next 30 min. (Patient not taking: Reported on 05/01/2015) 1 tablet 11  . gabapentin (NEURONTIN) 300 MG capsule Take 1 capsule (300 mg total) by mouth 3 (three) times daily. (Patient not taking: Reported on 05/01/2015) 90 capsule 3  . traMADol (ULTRAM) 50 MG tablet Take 1 tablet (50 mg total) by mouth 2 (two) times daily. (Patient not taking: Reported on 05/01/2015) 60 tablet 0   No facility-administered medications prior to visit.    Review of Systems   Patient denies headache, fevers, malaise, unintentional weight loss, skin rash, eye pain, sinus congestion and sinus pain, sore throat, dysphagia,  hemoptysis , cough, dyspnea, wheezing, chest pain, palpitations, orthopnea, edema, abdominal pain, nausea, melena, diarrhea, constipation, flank pain, dysuria, hematuria, urinary  Frequency, nocturia, numbness, tingling, seizures,  Focal weakness, Loss of consciousness,  Tremor, insomnia, depression, anxiety, and suicidal ideation.      Objective:  BP 104/68 mmHg  Pulse 73  Temp(Src) 97.4 F (36.3 C) (Oral)  Resp 12  Ht 5' 4.5" (1.638 m)  Wt 120 lb 12 oz (54.772 kg)  BMI 20.41 kg/m2  SpO2 99%  Physical Exam   General Appearance:    Alert, cooperative, no distress, appears stated age  Head:    Normocephalic, without obvious abnormality, atraumatic  Eyes:    PERRL, conjunctiva/corneas clear, EOM's intact, fundi    benign, both eyes  Ears:    Normal TM's and external ear canals, both ears  Nose:   Nares normal, septum midline, mucosa normal, no drainage    or sinus tenderness  Throat:   Lips, mucosa, and  tongue normal; teeth and gums normal  Neck:   Supple, symmetrical, trachea midline, no adenopathy;    thyroid:  no enlargement/tenderness/nodules; no carotid   bruit or JVD  Back:     Symmetric, no curvature, ROM normal, no CVA tenderness  Lungs:     Clear to auscultation bilaterally, respirations unlabored  Chest Wall:    No tenderness or deformity   Heart:    Regular rate and rhythm, S1 and S2 normal, no murmur, rub   or gallop  Breast Exam:    No tenderness, masses, or nipple abnormality  Abdomen:     Soft, non-tender, bowel sounds active all four quadrants,    no masses, no organomegaly  Genitalia:    Pelvic: cervix normal in appearance, external genitalia normal, no adnexal masses or tenderness, no cervical motion tenderness, rectovaginal septum normal, uterus normal size, shape, and consistency and vagina normal without discharge  Extremities:   Extremities normal, atraumatic, no cyanosis or edema  Pulses:   2+ and symmetric all extremities  Skin:   Skin color, texture, turgor normal, no rashes or lesions  Lymph nodes:   Cervical, supraclavicular, and axillary nodes normal  Neurologic:   CNII-XII intact, normal strength, sensation and  reflexes    throughout     Assessment & Plan:   Problem List Items Addressed This Visit      Unprioritized   Osteoporosis - Primary    Diagnosed in 2011.  She did not tolerate recent trial of Boniva due to persistent nausea, and had pill esophagitis with prior trial of alendronate in 2013.  T scores are unchanged by 2014 DEXA .   Needs [employer] AUTHORIZATION       Visit for preventive health examination    Annual wellness  exam was done as well as a comprehensive physical exam  .  During the course of the visit the patient was educated and counseled about appropriate screening and preventive services and screenings were brought up to date for cervical and breast cancer .  Marland Kitchen nutrition counseling, skin cancer screening has been recommended, along with  review of the age appropriate recommended immunizations.  Printed recommendations for health maintenance screenings was given.        Post-cholecystectomy syndrome    Improved with use of Questran.       Hyperlipidemia LDL goal <160    LDL and triglycerides are at goal on current medications. She has no side effects and liver enzymes are normal. No changes today.  Lab Results  Component Value Date   CHOL 193 05/01/2015   HDL 78.50 05/01/2015   LDLCALC 82 05/01/2015   LDLDIRECT 122.6 04/23/2013   TRIG 165.0* 05/01/2015   CHOLHDL 2 05/01/2015          Relevant Medications   rosuvastatin (CRESTOR) 10 MG tablet   cholestyramine (QUESTRAN) 4 GM/DOSE powder    Other Visit Diagnoses    Breast cancer screening        Relevant Orders    MM DIGITAL SCREENING BILATERAL    Need for hepatitis C screening test        Relevant Medications    rosuvastatin (CRESTOR) 10 MG tablet    Other Relevant Orders    Hepatitis C antibody (Completed)    Vitamin D deficiency        Relevant Orders    Vit D  25 hydroxy (rtn osteoporosis monitoring) (Completed)    Hyperlipidemia        Relevant Medications    rosuvastatin (CRESTOR) 10 MG tablet    cholestyramine (QUESTRAN) 4 GM/DOSE powder    Other Relevant Orders    Lipid panel (Completed)    Other fatigue        Relevant Orders    CBC with Differential/Platelet (Completed)    Comprehensive metabolic panel (Completed)    TSH (Completed)    Screening for cervical cancer        Relevant Orders    Cytology - PAP (Completed)       I have discontinued Ms. Wenzl's gabapentin and traMADol. I have also changed her CRESTOR to rosuvastatin. Additionally, I am having her maintain her (VITAMIN D, CHOLECALCIFEROL, PO), Calcium-Vitamin D-Vitamin K (VIACTIV PO), fluticasone, ibandronate, omeprazole, and cholestyramine.  Meds ordered this encounter  Medications  . rosuvastatin (CRESTOR) 10 MG tablet    Sig: TAKE ONE TABLET EACH NIGHT    Dispense:   30 tablet    Refill:  5  . cholestyramine (QUESTRAN) 4 GM/DOSE powder    Sig: USE AS PER PACKAGE INSTRUCTIONS TAKE ONESCOOPFUL THREE TIMES A DAY WITH MEALS    Dispense:  378 g    Refill:  6    Medications Discontinued During This Encounter  Medication Reason  .  gabapentin (NEURONTIN) 300 MG capsule Patient Preference  . traMADol (ULTRAM) 50 MG tablet Patient Preference  . CRESTOR 10 MG tablet Reorder  . cholestyramine (QUESTRAN) 4 GM/DOSE powder Reorder    Follow-up: No Follow-up on file.   [name], MD

## 2015-05-02 NOTE — Assessment & Plan Note (Signed)
Annual wellness  exam was done as well as a comprehensive physical exam  .  During the course of the visit the patient was educated and counseled about appropriate screening and preventive services and screenings were brought up to date for cervical and breast cancer .   nutrition counseling, skin cancer screening has been recommended, along with review of the age appropriate recommended immunizations.  Printed recommendations for health maintenance screenings was given.   

## 2015-05-03 ENCOUNTER — Encounter: Payer: Self-pay | Admitting: Internal Medicine

## 2015-05-03 ENCOUNTER — Telehealth: Payer: Self-pay

## 2015-05-03 NOTE — Telephone Encounter (Signed)
Please start the [employer] process for this patient.  Thanks.

## 2015-05-16 NOTE — Telephone Encounter (Signed)
I have electronically sent pt's info for [employer] insurance verification and will notify you once I have a response. Thank you. °

## 2015-05-23 NOTE — Telephone Encounter (Signed)
I have rec'd pt's insurance verification for [employer].  Admin and [employer] are subject to $3000 deductible (pt has met $0) and a 20% co-insurance which means pt would be responsible for the entire injection.  Since she has a commercial plan should would be eligible for the $25 co-pay card from [employer].  If you do not have any please let me know and I will send one for her.  She can call [facility] at [phone] and select option #1 to find out the procedure, but I'm pretty sure she will have to go to the pharmacy to receive her injection.  That means Dr. Derrel Nip will have to either give her a written Rx or send it to pt's drug store electronically.  I have sent a copy of the summary of benefits to be scanned into pt's chart.    Once pt has rec'd her injection, please send injection date to me so I can update the [employer] portal.  If you have any questions, please let me know. Thank you.

## 2015-05-30 NOTE — Telephone Encounter (Signed)
Spoke with the patient.  She is going to call to get eligibility with the copay card.  She will return a call to the office with an update after she calls.

## 2015-06-26 ENCOUNTER — Ambulatory Visit
Admission: RE | Admit: 2015-06-26 | Discharge: 2015-06-26 | Disposition: A | Discharge status: Home / Self Care | Payer: 59 | Source: Ambulatory Visit | Attending: Internal Medicine | Admitting: Internal Medicine

## 2015-06-26 DIAGNOSIS — Z1239 Encounter for other screening for malignant neoplasm of breast: Secondary | ICD-10-CM

## 2015-09-28 ENCOUNTER — Other Ambulatory Visit: Payer: Self-pay | Admitting: Internal Medicine

## 2015-10-26 ENCOUNTER — Other Ambulatory Visit: Payer: Self-pay | Admitting: Internal Medicine

## 2016-01-31 ENCOUNTER — Encounter: Payer: Self-pay | Admitting: Internal Medicine

## 2016-01-31 ENCOUNTER — Ambulatory Visit (INDEPENDENT_AMBULATORY_CARE_PROVIDER_SITE_OTHER): Payer: Self-pay | Admitting: Nurse Practitioner

## 2016-01-31 ENCOUNTER — Encounter: Payer: Self-pay | Admitting: Nurse Practitioner

## 2016-01-31 VITALS — BP 110/88 | HR 102 | Temp 98.7°F | Ht 64.5 in | Wt 120.0 lb

## 2016-01-31 DIAGNOSIS — R112 Nausea with vomiting, unspecified: Secondary | ICD-10-CM

## 2016-01-31 LAB — POCT INFLUENZA A/B
Influenza A, POC: NEGATIVE
Influenza B, POC: NEGATIVE

## 2016-01-31 MED ORDER — MECLIZINE HCL 25 MG PO TABS: 25.0000 mg | ORAL_TABLET | Freq: Three times a day (TID) | ORAL | Status: DC | PRN

## 2016-01-31 NOTE — Patient Instructions (Signed)
Lots of water and protein.   Imodium over the counter to slow the diarrhea.   Meclizine as needed for dizziness- drink lots of water with this, can dry you out.   We will send a MyChart with your results.

## 2016-01-31 NOTE — Telephone Encounter (Signed)
Patient scheduled with NP today

## 2016-01-31 NOTE — Progress Notes (Signed)
Patient ID: Taylor [name], female    DOB: 1951-09-27  Age: 65 y.o. MRN: [id number]  CC: Diarrhea; Fever; and Emesis   HPI Taylor [name] presents for CC of diarrhea, fever, emesis x 5 days.   1) Pt reports:  Sinus pressure maxillary Tmax 101 and nausea on Sunday  99.6 Tmax today  Vomiting from Sunday until 1 am Tuesday- last time  Watery/mucousy diarrhea  Dizziness, right ear bothering her today  Dry cough   Sick contacts - denies   Treatment to date:  Flonase and OTC sinus medication- somewhat helpful Tylenol- not helpful   Amoxicillin left over from another prescription on Sunday and Monday   Eating a small amount, drinking sprite and ice  Jello this morning  Decrease in urination- haven't noticed coloration differences Last BM- today, 6+ times little amounts, no blood   [name] has a past medical history of Normal cardiac stress test (2007); Irritable bowel syndrome; Mitral valve prolapse; Chest pain; Fever blister; Hyperlipidemia; and Osteoporosis.   She has past surgical history that includes Cholecystectomy (Approx - 2002).   Her family history includes Heart disease (age of onset: 80) in her father; Heart disease (age of onset: 64) in her brother; Multiple sclerosis in her mother.She reports that she has never smoked. She has never used smokeless tobacco. She reports that she drinks about 1.8 oz of alcohol per week. She reports that she does not use illicit drugs.  Outpatient Prescriptions Prior to Visit  Medication Sig Dispense Refill  . Calcium-Vitamin D-Vitamin K (VIACTIV PO) Take as directed    . cholestyramine (QUESTRAN) 4 GM/DOSE powder USE AS PER PACKAGE INSTRUCTIONS TAKE ONESCOOPFUL THREE TIMES A DAY WITH MEALS 378 g 6  . fluticasone (FLONASE) 50 MCG/ACT nasal spray USE 2 SPRAYS IN EACH NOSTRIL EVERY DAY 16 g 6  . omeprazole (PRILOSEC) 40 MG capsule TAKE ONE CAPSULE DAILY USUALLY 30 MINUTES BEFORE BREAKFAST 30 capsule 2  . rosuvastatin (CRESTOR) 10 MG tablet  TAKE ONE TABLET EACH NIGHT 30 tablet 5  . VITAMIN D, CHOLECALCIFEROL, PO Take one by mouth daily    . ibandronate (BONIVA) 150 MG tablet Take 1 tablet (150 mg total) by mouth every 30 (thirty) days. Take in the morning with a full glass of water, on an empty stomach, and do not take anything else by mouth or lie down for the next 30 min. (Patient not taking: Reported on 05/01/2015) 1 tablet 11   No facility-administered medications prior to visit.    ROS Review of Systems  Constitutional: Negative for fever, chills, diaphoresis, activity change, appetite change, fatigue and unexpected weight change.  HENT: Positive for sinus pressure. Negative for postnasal drip and rhinorrhea.   Eyes: Negative for visual disturbance.  Respiratory: Positive for cough. Negative for chest tightness and shortness of breath.   Cardiovascular: Negative for chest pain.  Gastrointestinal: Positive for vomiting and diarrhea. Negative for nausea.  Genitourinary: Positive for decreased urine volume. Negative for difficulty urinating.  Neurological: Positive for dizziness. Negative for headaches.  Psychiatric/Behavioral: Positive for sleep disturbance. Negative for suicidal ideas. The patient is nervous/anxious.     Objective:  BP 110/88 mmHg  Pulse 102  Temp(Src) 98.7 F (37.1 C) (Oral)  Ht 5' 4.5" (1.638 m)  Wt 120 lb (54.432 kg)  BMI 20.29 kg/m2  SpO2 97%  Physical Exam  Constitutional: She is oriented to person, place, and time. She appears well-developed and well-nourished. No distress.  HENT:  Head: Normocephalic and atraumatic.  Right  Ear: External ear normal.  Left Ear: External ear normal.  Cardiovascular: Normal rate and regular rhythm.   Pulmonary/Chest: Effort normal and breath sounds normal. No respiratory distress. She has no wheezes. She has no rales. She exhibits no tenderness.  Abdominal: Soft. Bowel sounds are normal. She exhibits no distension and no mass. There is no tenderness. There is  no rebound and no guarding.  Neurological: She is alert and oriented to person, place, and time. No cranial nerve deficit. She exhibits normal muscle tone. Coordination normal.  Skin: Skin is warm and dry. No rash noted. She is not diaphoretic.  Psychiatric: She has a normal mood and affect. Her behavior is normal. Judgment and thought content normal.   Assessment & Plan:   Timekia was seen today for diarrhea, fever and emesis.  Diagnoses and all orders for this visit:  Nausea and vomiting, vomiting of unspecified type -     POCT Influenza A/B -     CBC with Differential/Platelet -     Basic Metabolic Panel (BMET)  Other orders -     Discontinue: meclizine (ANTIVERT) 25 MG tablet; Take 1 tablet (25 mg total) by mouth 3 (three) times daily as needed for dizziness. -     meclizine (ANTIVERT) 25 MG tablet; Take 1 tablet (25 mg total) by mouth 3 (three) times daily as needed for dizziness.   I have discontinued Ms. Grenz's ibandronate. I am also having her maintain her (VITAMIN D, CHOLECALCIFEROL, PO), Calcium-Vitamin D-Vitamin K (VIACTIV PO), rosuvastatin, cholestyramine, fluticasone, omeprazole, and meclizine.  Meds ordered this encounter  Medications  . DISCONTD: meclizine (ANTIVERT) 25 MG tablet    Sig: Take 1 tablet (25 mg total) by mouth 3 (three) times daily as needed for dizziness.    Dispense:  30 tablet    Refill:  0    Order Specific Question:  Supervising Provider    Answer:  Deborra Medina L [[id number]]  . meclizine (ANTIVERT) 25 MG tablet    Sig: Take 1 tablet (25 mg total) by mouth 3 (three) times daily as needed for dizziness.    Dispense:  30 tablet    Refill:  0    Order Specific Question:  Supervising Provider    Answer:  Crecencio Mc [[id number]]     Follow-up: Return if symptoms worsen or fail to improve.

## 2016-02-01 ENCOUNTER — Encounter: Payer: Self-pay | Admitting: Nurse Practitioner

## 2016-02-01 LAB — CBC WITH DIFFERENTIAL/PLATELET
Basophils Absolute: 0 10*3/uL (ref 0.0–0.1)
Basophils Relative: 0.7 % (ref 0.0–3.0)
Eosinophils Absolute: 0 10*3/uL (ref 0.0–0.7)
Eosinophils Relative: 0.2 % (ref 0.0–5.0)
HCT: 44.8 % (ref 36.0–46.0)
Hemoglobin: 15 g/dL (ref 12.0–15.0)
Lymphocytes Relative: 35.7 % (ref 12.0–46.0)
Lymphs Abs: 1.6 10*3/uL (ref 0.7–4.0)
MCHC: 33.5 g/dL (ref 30.0–36.0)
MCV: 92.3 fl (ref 78.0–100.0)
Monocytes Absolute: 0.6 10*3/uL (ref 0.1–1.0)
Monocytes Relative: 13.4 % — ABNORMAL HIGH (ref 3.0–12.0)
Neutro Abs: 2.2 10*3/uL (ref 1.4–7.7)
Neutrophils Relative %: 50 % (ref 43.0–77.0)
Platelets: 248 10*3/uL (ref 150.0–400.0)
RBC: 4.85 Mil/uL (ref 3.87–5.11)
RDW: 13.9 % (ref 11.5–15.5)
WBC: 4.4 10*3/uL (ref 4.0–10.5)

## 2016-02-01 LAB — BASIC METABOLIC PANEL
BUN: 10 mg/dL (ref 6–23)
CO2: 24 mEq/L (ref 19–32)
Calcium: 9.8 mg/dL (ref 8.4–10.5)
Chloride: 100 mEq/L (ref 96–112)
Creatinine, Ser: 0.87 mg/dL (ref 0.40–1.20)
GFR: 69.5 mL/min (ref >60.00)
Glucose, Bld: 80 mg/dL (ref 70–99)
Potassium: 3.9 mEq/L (ref 3.5–5.1)
Sodium: 138 mEq/L (ref 135–145)

## 2016-02-08 DIAGNOSIS — R1110 Vomiting, unspecified: Secondary | ICD-10-CM | POA: Insufficient documentation

## 2016-02-08 NOTE — Assessment & Plan Note (Signed)
POCT influenza rapid negative Checking be met in CBC with differential today This has largely resolved but will try Antivert for some of the dizziness which may also help with some of the nausea

## 2016-02-19 ENCOUNTER — Ambulatory Visit
Admission: RE | Admit: 2016-02-19 | Discharge: 2016-02-19 | Disposition: A | Discharge status: Home / Self Care | Payer: 59 | Source: Ambulatory Visit | Attending: Family Medicine | Admitting: Family Medicine

## 2016-02-19 ENCOUNTER — Encounter: Payer: Self-pay | Admitting: Family Medicine

## 2016-02-19 ENCOUNTER — Ambulatory Visit (INDEPENDENT_AMBULATORY_CARE_PROVIDER_SITE_OTHER): Payer: 59 | Admitting: Family Medicine

## 2016-02-19 VITALS — BP 120/82 | HR 113 | Temp 100.6°F | Ht 64.5 in | Wt 123.1 lb

## 2016-02-19 DIAGNOSIS — J9811 Atelectasis: Secondary | ICD-10-CM | POA: Diagnosis not present

## 2016-02-19 DIAGNOSIS — R05 Cough: Secondary | ICD-10-CM

## 2016-02-19 DIAGNOSIS — R059 Cough, unspecified: Secondary | ICD-10-CM

## 2016-02-19 DIAGNOSIS — R509 Fever, unspecified: Secondary | ICD-10-CM

## 2016-02-19 DIAGNOSIS — J189 Pneumonia, unspecified organism: Secondary | ICD-10-CM | POA: Insufficient documentation

## 2016-02-19 LAB — POCT INFLUENZA A/B
Influenza A, POC: NEGATIVE
Influenza B, POC: NEGATIVE

## 2016-02-19 MED ORDER — DOXYCYCLINE HYCLATE 100 MG PO TABS: 100.0000 mg | ORAL_TABLET | Freq: Two times a day (BID) | ORAL | Status: DC

## 2016-02-19 NOTE — Progress Notes (Signed)
Pre visit review using our clinic review tool, if applicable. No additional management support is needed unless otherwise documented below in the visit note. 

## 2016-02-19 NOTE — Progress Notes (Signed)
Patient ID: [name], female   DOB: 1951-06-30, 65 y.o.   MRN: [id number]  Tommi Rumps, MD Phone: [phone]  AZARIYA FREEMAN is a 65 y.o. female who presents today for same-day visit.  Patient presents with 3 weeks of intermittent fevers and maxillary sinus pressure and congestion. She notes a nonproductive cough. She notes she has not able to blow anything out of her nose. Minimal postnasal drip if any. She's using a Nettie pot daily. Some mild shortness of breath with cough and activity. No chest pain. No hemoptysis. No dizziness. She was seen 3 weeks ago by a nurse practitioner and treated for dizziness with meclizine. She had some nausea and vomiting at that time though none now. Notes most of her symptoms from then have improved with exception of the sinus congestion and fevers. No pleuritic chest pain. No exertional chest pain. No chest pain of any kind. No history of PE or DVT. No recent surgeries or travel. No leg swelling. No history of cancer. No cardiac history. No shortness of breath at this time. Patient is staying well-hydrated. Notes urination is normal.  PMH: nonsmoker.   ROS see history of present illness  Objective  Physical Exam Filed Vitals:   02/19/16 1509 02/19/16 1530  BP: 120/82   Pulse: 126 113  Temp: 100.6 F (38.1 C)     BP Readings from Last 3 Encounters:  02/19/16 120/82  01/31/16 110/88  05/01/15 104/68   Wt Readings from Last 3 Encounters:  02/19/16 123 lb 2 oz (55.849 kg)  01/31/16 120 lb (54.432 kg)  05/01/15 120 lb 12 oz (54.772 kg)    Physical Exam  Constitutional: She is well-developed, well-nourished, and in no distress.  HENT:  Head: Normocephalic and atraumatic.  Right Ear: External ear normal.  Left Ear: External ear normal.  Mouth/Throat: Oropharynx is clear and moist. No oropharyngeal exudate.  Normal TMs bilaterally  Eyes: Conjunctivae are normal. Pupils are equal, round, and reactive to light.  Neck: Neck supple.    Cardiovascular: Normal heart sounds.   Tachycardic, regular rhythm  Pulmonary/Chest: Effort normal. No respiratory distress. She has no wheezes. She has no rales.  Mild decreased breath sounds bilateral bases  Musculoskeletal:  No lower extremity swelling  Lymphadenopathy:    She has no cervical adenopathy.  Neurological: She is alert. Gait normal.  Skin: Skin is warm and dry. She is not diaphoretic.     Assessment/Plan: Please see individual problem list.  Fever, unspecified Suspect persistent fever, cough, and tachycardia likely related to bacterial infection. Patient is well-appearing and in no distress. Could be sinusitis versus pneumonia given the constellation of symptoms. Unlikely that symptoms are cardiac in nature. Unlikely to be related to PE given wells score of 1.5 (points for tachycardia) and stable oxygenation in patient with no risk factors. We will check a CBC and CMP. We will obtain a chest x-ray to evaluate for pneumonia. We will start on doxycycline to cover pneumonia and sinus infection. She will stay well hydrated. She is given return precautions.    Orders Placed This Encounter  Procedures  . DG Chest 2 View    Standing Status: Future     Number of Occurrences: 1     Standing Expiration Date: 04/20/2017    Order Specific Question:  Reason for Exam (SYMPTOM  OR DIAGNOSIS REQUIRED)    Answer:  cough, fever, shortness of breath    Order Specific Question:  Preferred imaging location?    Answer:  [facility]  . CBC  . Comp Met (CMET)  . POCT Influenza A/B    Meds ordered this encounter  Medications  . doxycycline (VIBRA-TABS) 100 MG tablet    Sig: Take 1 tablet (100 mg total) by mouth 2 (two) times daily.    Dispense:  14 tablet    Refill:  0    Tommi Rumps, MD [place]

## 2016-02-19 NOTE — Assessment & Plan Note (Signed)
Suspect persistent fever, cough, and tachycardia likely related to bacterial infection. Patient is well-appearing and in no distress. Could be sinusitis versus pneumonia given the constellation of symptoms. Unlikely that symptoms are cardiac in nature. Unlikely to be related to PE given wells score of 1.5 (points for tachycardia) and stable oxygenation in patient with no risk factors. We will check a CBC and CMP. We will obtain a chest x-ray to evaluate for pneumonia. We will start on doxycycline to cover pneumonia and sinus infection. She will stay well hydrated. She is given return precautions.

## 2016-02-19 NOTE — Patient Instructions (Signed)
Nice to see you. We're going to obtain a chest x-ray to evaluate for pneumonia. We will treat with doxycycline for likely sinus infection though this will also cover for pneumonia. Please stay well hydrated. We'll get some lab work as well. If you develop chest pain, shortness breath, cough productive of blood, persistent fevers, or any new or changing symptoms please seek medical attention.

## 2016-02-20 LAB — CBC
HCT: 35.8 % — ABNORMAL LOW (ref 36.0–46.0)
Hemoglobin: 12 g/dL (ref 12.0–15.0)
MCHC: 33.5 g/dL (ref 30.0–36.0)
MCV: 91.8 fl (ref 78.0–100.0)
Platelets: 358 10*3/uL (ref 150.0–400.0)
RBC: 3.91 Mil/uL (ref 3.87–5.11)
RDW: 13.6 % (ref 11.5–15.5)
WBC: 14.1 10*3/uL — ABNORMAL HIGH (ref 4.0–10.5)

## 2016-02-20 LAB — COMPREHENSIVE METABOLIC PANEL
ALT: 52 U/L — ABNORMAL HIGH (ref 0–35)
AST: 86 U/L — ABNORMAL HIGH (ref 0–37)
Albumin: 4.2 g/dL (ref 3.5–5.2)
Alkaline Phosphatase: 133 U/L — ABNORMAL HIGH (ref 39–117)
BUN: 11 mg/dL (ref 6–23)
CO2: 27 mEq/L (ref 19–32)
Calcium: 9.9 mg/dL (ref 8.4–10.5)
Chloride: 100 mEq/L (ref 96–112)
Creatinine, Ser: 0.89 mg/dL (ref 0.40–1.20)
GFR: 67.69 mL/min (ref >60.00)
Glucose, Bld: 113 mg/dL — ABNORMAL HIGH (ref 70–99)
Potassium: 3.7 mEq/L (ref 3.5–5.1)
Sodium: 137 mEq/L (ref 135–145)
Total Bilirubin: 1 mg/dL (ref 0.2–1.2)
Total Protein: 8.1 g/dL (ref 6.0–8.3)

## 2016-02-22 ENCOUNTER — Encounter: Payer: Self-pay | Admitting: Family Medicine

## 2016-02-22 ENCOUNTER — Ambulatory Visit (INDEPENDENT_AMBULATORY_CARE_PROVIDER_SITE_OTHER): Payer: 59 | Admitting: Family Medicine

## 2016-02-22 VITALS — BP 122/74 | HR 101 | Temp 99.6°F | Ht 64.5 in | Wt 122.6 lb

## 2016-02-22 DIAGNOSIS — J189 Pneumonia, unspecified organism: Secondary | ICD-10-CM | POA: Diagnosis not present

## 2016-02-22 DIAGNOSIS — R7989 Other specified abnormal findings of blood chemistry: Secondary | ICD-10-CM

## 2016-02-22 DIAGNOSIS — R945 Abnormal results of liver function studies: Principal | ICD-10-CM

## 2016-02-22 MED ORDER — LEVOFLOXACIN 750 MG PO TABS: 750.0000 mg | ORAL_TABLET | Freq: Every day | ORAL | Status: DC

## 2016-02-22 NOTE — Assessment & Plan Note (Signed)
Patient's chest x-ray revealed right lower lobe infiltrate on Monday. Also with likely maxillary sinusitis. Vital sign abnormalities have improved though the patient's symptomatology has not significantly improved on doxycycline. Given this we will switch her to Levaquin to cover community-acquired pneumonia not completely responsive to doxycycline. This will additionally cover sinusitis not responsive to doxycycline. I did discuss risks of neuropathy and tendon rupture with Levaquin. She is well appearing on exam and vital signs are improved. She is stable for outpatient management. Advised on pseudoephedrine for sinus congestion. She'll continue to monitor. She is given return precautions.

## 2016-02-22 NOTE — Assessment & Plan Note (Signed)
New issue. Suspect likely related to her Tylenol intake though could be related to her current illness. Benign abdominal exam. Advised against Tylenol use. Advised to avoid alcohol. We will recheck these today.

## 2016-02-22 NOTE — Progress Notes (Signed)
Patient ID: Taylor [name], female   DOB: [birth year], 65 y.o.   MRN: [id number]  Tommi Rumps, MD Phone: [phone]  Taylor [name] is a 65 y.o. female who presents today for follow-up.  Patient seen earlier this week for fever, tachycardia, sinus congestion, and cough. Diagnosed with sinusitis and pneumonia based on chest x-ray. She was treated with doxycycline. She notes she has not had any temperatures greater than 100F since that time though she continues to have worsening nasal and sinus congestion. She notes she coughed up some very green mucus several days ago. Cough is not improved. No blood in her sputum. No chest pain or shortness of breath. She's been taking the doxycycline.  Elevated LFTs: Found on recent lab work. Patient does note recently she has been taking a significant amount of Tylenol since she has felt poorly. She has stopped this over the last several days. Not much alcohol intake. No abdominal pain.  PMH: nonsmoker.   ROS see history of present illness  Objective  Physical Exam Filed Vitals:   02/22/16 1530  BP: 122/74  Pulse: 101  Temp: 99.6 F (37.6 C)    BP Readings from Last 3 Encounters:  02/22/16 122/74  02/19/16 120/82  01/31/16 110/88   Wt Readings from Last 3 Encounters:  02/22/16 122 lb 9.6 oz (55.611 kg)  02/19/16 123 lb 2 oz (55.849 kg)  01/31/16 120 lb (54.432 kg)    Physical Exam  Constitutional: She is well-developed, well-nourished, and in no distress.  HENT:  Head: Normocephalic and atraumatic.  Right Ear: External ear normal.  Left Ear: External ear normal.  Mild posterior oropharyngeal erythema with no tonsillar exudate or tonsillar swelling, normal TMs bilaterally, bilateral maxillary sinuses tender to percussion  Eyes: Conjunctivae are normal. Pupils are equal, round, and reactive to light.  Neck: Neck supple.  Cardiovascular: Normal rate, regular rhythm and normal heart sounds.   Pulmonary/Chest: Effort normal and breath  sounds normal.  Abdominal: Soft. Bowel sounds are normal. She exhibits no distension. There is no tenderness. There is no rebound and no guarding.  Lymphadenopathy:    She has no cervical adenopathy.  Neurological: She is alert. Gait normal.  Skin: Skin is warm and dry. She is not diaphoretic.     Assessment/Plan: Please see individual problem list.  Community acquired pneumonia Patient's chest x-ray revealed right lower lobe infiltrate on Monday. Also with likely maxillary sinusitis. Vital sign abnormalities have improved though the patient's symptomatology has not significantly improved on doxycycline. Given this we will switch her to Levaquin to cover community-acquired pneumonia not completely responsive to doxycycline. This will additionally cover sinusitis not responsive to doxycycline. I did discuss risks of neuropathy and tendon rupture with Levaquin. She is well appearing on exam and vital signs are improved. She is stable for outpatient management. Advised on pseudoephedrine for sinus congestion. She'll continue to monitor. She is given return precautions.  Elevated LFTs New issue. Suspect likely related to her Tylenol intake though could be related to her current illness. Benign abdominal exam. Advised against Tylenol use. Advised to avoid alcohol. We will recheck these today.    Orders Placed This Encounter  Procedures  . Comp Met (CMET)    Meds ordered this encounter  Medications  . levofloxacin (LEVAQUIN) 750 MG tablet    Sig: Take 1 tablet (750 mg total) by mouth daily.    Dispense:  5 tablet    Refill:  0    Tommi Rumps, MD [facility]  [employer]

## 2016-02-22 NOTE — Patient Instructions (Signed)
Nice to see you. Given that you are not significantly improved after several days of doxycycline we are going to change her to Levaquin to cover for pneumonia and sinus infection. You can use pseudoephedrine that she can pick up behind the counter at the pharmacy. We will check your liver function tests again as well. He should avoid Tylenol. Avoid alcohol as well. If you develop abdominal pain, shortness of breath, chest pain, cough productive of blood, worsening fevers, numbness, weakness, vision changes, or any new or changing symptoms please seek medical attention.

## 2016-02-22 NOTE — Progress Notes (Signed)
Pre visit review using our clinic review tool, if applicable. No additional management support is needed unless otherwise documented below in the visit note. 

## 2016-02-23 ENCOUNTER — Encounter: Payer: Self-pay | Admitting: Family Medicine

## 2016-02-23 LAB — COMPREHENSIVE METABOLIC PANEL
ALT: 71 U/L — ABNORMAL HIGH (ref 0–35)
AST: 69 U/L — ABNORMAL HIGH (ref 0–37)
Albumin: 3.9 g/dL (ref 3.5–5.2)
Alkaline Phosphatase: 242 U/L — ABNORMAL HIGH (ref 39–117)
BUN: 11 mg/dL (ref 6–23)
CO2: 24 mEq/L (ref 19–32)
Calcium: 9.8 mg/dL (ref 8.4–10.5)
Chloride: 104 mEq/L (ref 96–112)
Creatinine, Ser: 0.75 mg/dL (ref 0.40–1.20)
GFR: 82.46 mL/min (ref >60.00)
Glucose, Bld: 83 mg/dL (ref 70–99)
Potassium: 4 mEq/L (ref 3.5–5.1)
Sodium: 139 mEq/L (ref 135–145)
Total Bilirubin: 0.3 mg/dL (ref 0.2–1.2)
Total Protein: 7.7 g/dL (ref 6.0–8.3)

## 2016-02-26 ENCOUNTER — Encounter: Payer: Self-pay | Admitting: Family Medicine

## 2016-02-26 ENCOUNTER — Other Ambulatory Visit: Payer: Self-pay | Admitting: Family Medicine

## 2016-02-26 DIAGNOSIS — R945 Abnormal results of liver function studies: Principal | ICD-10-CM

## 2016-02-26 DIAGNOSIS — R7989 Other specified abnormal findings of blood chemistry: Secondary | ICD-10-CM

## 2016-03-01 ENCOUNTER — Ambulatory Visit
Admission: RE | Admit: 2016-03-01 | Discharge: 2016-03-01 | Disposition: A | Discharge status: Home / Self Care | Payer: 59 | Source: Ambulatory Visit | Attending: Family Medicine | Admitting: Family Medicine

## 2016-03-01 DIAGNOSIS — Z9049 Acquired absence of other specified parts of digestive tract: Secondary | ICD-10-CM | POA: Insufficient documentation

## 2016-03-01 DIAGNOSIS — R7989 Other specified abnormal findings of blood chemistry: Secondary | ICD-10-CM | POA: Insufficient documentation

## 2016-03-01 DIAGNOSIS — R945 Abnormal results of liver function studies: Secondary | ICD-10-CM

## 2016-03-03 ENCOUNTER — Encounter: Payer: Self-pay | Admitting: Family Medicine

## 2016-03-06 ENCOUNTER — Telehealth: Payer: Self-pay | Admitting: *Deleted

## 2016-03-06 ENCOUNTER — Other Ambulatory Visit (INDEPENDENT_AMBULATORY_CARE_PROVIDER_SITE_OTHER): Payer: 59

## 2016-03-06 DIAGNOSIS — E785 Hyperlipidemia, unspecified: Secondary | ICD-10-CM | POA: Diagnosis not present

## 2016-03-06 DIAGNOSIS — E559 Vitamin D deficiency, unspecified: Secondary | ICD-10-CM

## 2016-03-06 DIAGNOSIS — R5383 Other fatigue: Secondary | ICD-10-CM | POA: Diagnosis not present

## 2016-03-06 LAB — CBC WITH DIFFERENTIAL/PLATELET
Basophils Absolute: 0 10*3/uL (ref 0.0–0.1)
Basophils Relative: 0.5 % (ref 0.0–3.0)
Eosinophils Absolute: 0.1 10*3/uL (ref 0.0–0.7)
Eosinophils Relative: 1.8 % (ref 0.0–5.0)
HCT: 39 % (ref 36.0–46.0)
Hemoglobin: 12.9 g/dL (ref 12.0–15.0)
Lymphocytes Relative: 40.9 % (ref 12.0–46.0)
Lymphs Abs: 2.9 10*3/uL (ref 0.7–4.0)
MCHC: 33.2 g/dL (ref 30.0–36.0)
MCV: 91.6 fl (ref 78.0–100.0)
Monocytes Absolute: 0.8 10*3/uL (ref 0.1–1.0)
Monocytes Relative: 10.7 % (ref 3.0–12.0)
Neutro Abs: 3.3 10*3/uL (ref 1.4–7.7)
Neutrophils Relative %: 46.1 % (ref 43.0–77.0)
Platelets: 385 10*3/uL (ref 150.0–400.0)
RBC: 4.26 Mil/uL (ref 3.87–5.11)
RDW: 14 % (ref 11.5–15.5)
WBC: 7.1 10*3/uL (ref 4.0–10.5)

## 2016-03-06 LAB — VITAMIN D 25 HYDROXY (VIT D DEFICIENCY, FRACTURES): VITD: 28.6 ng/mL — ABNORMAL LOW (ref 30.00–100.00)

## 2016-03-06 LAB — COMPREHENSIVE METABOLIC PANEL
ALT: 18 U/L (ref 0–35)
AST: 20 U/L (ref 0–37)
Albumin: 4.2 g/dL (ref 3.5–5.2)
Alkaline Phosphatase: 107 U/L (ref 39–117)
BUN: 13 mg/dL (ref 6–23)
CO2: 31 mEq/L (ref 19–32)
Calcium: 9.8 mg/dL (ref 8.4–10.5)
Chloride: 102 mEq/L (ref 96–112)
Creatinine, Ser: 0.84 mg/dL (ref 0.40–1.20)
GFR: 72.35 mL/min (ref >60.00)
Glucose, Bld: 81 mg/dL (ref 70–99)
Potassium: 4.5 mEq/L (ref 3.5–5.1)
Sodium: 139 mEq/L (ref 135–145)
Total Bilirubin: 0.5 mg/dL (ref 0.2–1.2)
Total Protein: 7 g/dL (ref 6.0–8.3)

## 2016-03-06 LAB — LIPID PANEL
Cholesterol: 225 mg/dL — ABNORMAL HIGH (ref 0–200)
HDL: 56.7 mg/dL (ref >39.00)
LDL Cholesterol: 134 mg/dL — ABNORMAL HIGH (ref 0–99)
NonHDL: 167.84
Total CHOL/HDL Ratio: 4
Triglycerides: 171 mg/dL — ABNORMAL HIGH (ref 0.0–149.0)
VLDL: 34.2 mg/dL (ref 0.0–40.0)

## 2016-03-06 LAB — HIV ANTIBODY (ROUTINE TESTING W REFLEX): HIV 1&2 Ab, 4th Generation: NONREACTIVE

## 2016-03-06 LAB — TSH: TSH: 3.04 u[IU]/mL (ref 0.35–4.50)

## 2016-03-06 NOTE — Telephone Encounter (Signed)
Labs and dx?  

## 2016-03-09 ENCOUNTER — Encounter: Payer: Self-pay | Admitting: Internal Medicine

## 2016-03-11 ENCOUNTER — Encounter: Payer: Self-pay | Admitting: Internal Medicine

## 2016-04-22 ENCOUNTER — Other Ambulatory Visit: Payer: Self-pay | Admitting: Internal Medicine

## 2016-05-02 ENCOUNTER — Ambulatory Visit (INDEPENDENT_AMBULATORY_CARE_PROVIDER_SITE_OTHER): Payer: 59

## 2016-05-02 ENCOUNTER — Ambulatory Visit (INDEPENDENT_AMBULATORY_CARE_PROVIDER_SITE_OTHER): Payer: 59 | Admitting: Internal Medicine

## 2016-05-02 ENCOUNTER — Encounter: Payer: Self-pay | Admitting: Internal Medicine

## 2016-05-02 VITALS — BP 120/84 | HR 84 | Temp 98.3°F | Resp 12 | Ht 64.25 in | Wt 120.2 lb

## 2016-05-02 DIAGNOSIS — R938 Abnormal findings on diagnostic imaging of other specified body structures: Secondary | ICD-10-CM

## 2016-05-02 DIAGNOSIS — R9389 Abnormal findings on diagnostic imaging of other specified body structures: Secondary | ICD-10-CM

## 2016-05-02 DIAGNOSIS — Z8249 Family history of ischemic heart disease and other diseases of the circulatory system: Secondary | ICD-10-CM

## 2016-05-02 DIAGNOSIS — J189 Pneumonia, unspecified organism: Secondary | ICD-10-CM

## 2016-05-02 DIAGNOSIS — E785 Hyperlipidemia, unspecified: Secondary | ICD-10-CM

## 2016-05-02 DIAGNOSIS — Z0000 Encounter for general adult medical examination without abnormal findings: Secondary | ICD-10-CM | POA: Diagnosis not present

## 2016-05-02 DIAGNOSIS — R748 Abnormal levels of other serum enzymes: Secondary | ICD-10-CM

## 2016-05-02 DIAGNOSIS — Z23 Encounter for immunization: Secondary | ICD-10-CM | POA: Diagnosis not present

## 2016-05-02 DIAGNOSIS — R7989 Other specified abnormal findings of blood chemistry: Secondary | ICD-10-CM

## 2016-05-02 DIAGNOSIS — K2100 Gastro-esophageal reflux disease with esophagitis, without bleeding: Secondary | ICD-10-CM

## 2016-05-02 DIAGNOSIS — M810 Age-related osteoporosis without current pathological fracture: Secondary | ICD-10-CM

## 2016-05-02 DIAGNOSIS — R945 Abnormal results of liver function studies: Secondary | ICD-10-CM

## 2016-05-02 LAB — LIPID PANEL
Cholesterol: 185 mg/dL (ref 0–200)
HDL: 69.1 mg/dL (ref >39.00)
LDL Cholesterol: 90 mg/dL (ref 0–99)
NonHDL: 115.45
Total CHOL/HDL Ratio: 3
Triglycerides: 128 mg/dL (ref 0.0–149.0)
VLDL: 25.6 mg/dL (ref 0.0–40.0)

## 2016-05-02 LAB — COMPREHENSIVE METABOLIC PANEL
ALT: 17 U/L (ref 0–35)
AST: 23 U/L (ref 0–37)
Albumin: 4.4 g/dL (ref 3.5–5.2)
Alkaline Phosphatase: 75 U/L (ref 39–117)
BUN: 14 mg/dL (ref 6–23)
CO2: 28 mEq/L (ref 19–32)
Calcium: 9.9 mg/dL (ref 8.4–10.5)
Chloride: 104 mEq/L (ref 96–112)
Creatinine, Ser: 0.88 mg/dL (ref 0.40–1.20)
GFR: 68.53 mL/min (ref >60.00)
Glucose, Bld: 86 mg/dL (ref 70–99)
Potassium: 4.5 mEq/L (ref 3.5–5.1)
Sodium: 138 mEq/L (ref 135–145)
Total Bilirubin: 0.6 mg/dL (ref 0.2–1.2)
Total Protein: 7.7 g/dL (ref 6.0–8.3)

## 2016-05-02 NOTE — Progress Notes (Signed)
Patient ID: Taylor [name], female    DOB: 1950-10-15  Age: 65 y.o. MRN: [id number]  The patient is here for welcome to  Medicare wellness examination and management of other chronic and acute problems.   Had a protracted illness, cxr was c/w  PNA in March with liver enzyme elevation  Needs chest x ray to be repeated  Strong FH of early CAD,  Father at 13, brother at 97  Sees Dr Ubaldo Glassing .  Wants cardiac CT    The risk factors are reflected in the social history.  The roster of all physicians providing medical care to patient - is listed in the Snapshot section of the chart.  Activities of daily living:  The patient is 100% independent in all ADLs: dressing, toileting, feeding as well as independent mobility  Home safety : The patient has smoke detectors in the home. They wear seatbelts.  There are no firearms at home. There is no violence in the home.   There is no risks for hepatitis, STDs or HIV. There is no   history of blood transfusion. They have no travel history to infectious disease endemic areas of the world.  The patient has seen their dentist in the last six month. They have seen their eye doctor in the last year. They admit to slight hearing difficulty with regard to whispered voices and some television programs.  They have deferred audiologic testing in the last year.  They do not  have excessive sun exposure. Discussed the need for sun protection: hats, long sleeves and use of sunscreen if there is significant sun exposure.   Diet: the importance of a healthy diet is discussed. They do have a healthy diet.  The benefits of regular aerobic exercise were discussed. She walks 4 times per week ,  20 minutes.   Depression screen: there are no signs or vegative symptoms of depression- irritability, change in appetite, anhedonia, sadness/tearfullness.  Cognitive assessment: the patient manages all their financial and personal affairs and is actively engaged. They could relate day,date,year  and events; recalled 2/3 objects at 3 minutes; performed clock-face test normally.  The following portions of the patient's history were reviewed and updated as appropriate: allergies, current medications, past family history, past medical history,  past surgical history, past social history  and problem list.  During the course of the visit , End of Life objectives were discussed at length,  Patient does  have a living will in place but not  a healthcare power of attorney.  She was given printed information about advance directives and encouraged to return after discussing with her family,    Visual acuity was not assessed per patient preference since she has regular follow up with her ophthalmologist. Hearing and body mass index were assessed and reviewed.   During the course of the visit the patient was educated and counseled about appropriate screening and preventive services including : fall prevention , diabetes screening, nutrition counseling, colorectal cancer screening, and recommended immunizations.    CC: The primary encounter diagnosis was Hyperlipidemia. Diagnoses of Abnormal chest x-ray, Elevated liver enzymes, Wellness examination, Family history of early CAD, Need for prophylactic vaccination against Streptococcus pneumoniae (pneumococcus), Osteoporosis, Gastroesophageal reflux disease with esophagitis, Medicare annual wellness visit, initial, Visit for preventive health examination, Community acquired pneumonia, and Elevated LFTs were also pertinent to this visit.  Has been having significant indigestion ,  Taking prilosec once daily in the morning .  Changed diet to an "ulcer diet" which relieved  her symptoms transiently , but for the past 2-3 weeks has been having  episodes  of severe epigastric pain like a bad cramp .  Relieved with passing gas.  Avoiding  Gas forming veggies. Occurring 5-6 times per week .  Not taking a probiotic   EGD/colonoscopoy reports from  2014.reviewed    Hiatal Hernia and  Gastritis    Not taking Crestor , now taking 1/2 daily .  Some muscle soreness but still physically active.   [name] has a past medical history of Normal cardiac stress test (2007); Irritable bowel syndrome; Mitral valve prolapse; Chest pain; Fever blister; Hyperlipidemia; and Osteoporosis.   She has past surgical history that includes Cholecystectomy (Approx - 2002).   Her family history includes Heart disease (age of onset: 41) in her father; Heart disease (age of onset: 39) in her brother; Multiple sclerosis in her mother.She reports that she has never smoked. She has never used smokeless tobacco. She reports that she drinks about 1.8 oz of alcohol per week. She reports that she does not use illicit drugs.  Outpatient Prescriptions Prior to Visit  Medication Sig Dispense Refill  . Calcium-Vitamin D-Vitamin K (VIACTIV PO) Take as directed    . cholestyramine (QUESTRAN) 4 GM/DOSE powder USE AS PER PACKAGE INSTRUCTIONS TAKE ONESCOOPFUL THREE TIMES A DAY WITH MEALS 378 g 6  . fluticasone (FLONASE) 50 MCG/ACT nasal spray USE 2 SPRAYS IN EACH NOSTRIL EVERY DAY 16 g 6  . omeprazole (PRILOSEC) 40 MG capsule TAKE 1 CAPSULE BY MOUTH DAILY USUALLY 30MINS BEFORE BREAKFAST 30 capsule 0  . rosuvastatin (CRESTOR) 10 MG tablet TAKE ONE TABLET EACH NIGHT 30 tablet 5  . VITAMIN D, CHOLECALCIFEROL, PO Take one by mouth daily    . meclizine (ANTIVERT) 25 MG tablet Take 1 tablet (25 mg total) by mouth 3 (three) times daily as needed for dizziness. (Patient not taking: Reported on 05/02/2016) 30 tablet 0  . levofloxacin (LEVAQUIN) 750 MG tablet Take 1 tablet (750 mg total) by mouth daily. 5 tablet 0   No facility-administered medications prior to visit.    Review of Systems   Patient denies headache, fevers, malaise, unintentional weight loss, skin rash, eye pain, sinus congestion and sinus pain, sore throat, dysphagia,  hemoptysis , cough, dyspnea, wheezing, chest pain, palpitations,  orthopnea, edema, abdominal pain, nausea, melena, diarrhea, constipation, flank pain, dysuria, hematuria, urinary  Frequency, nocturia, numbness, tingling, seizures,  Focal weakness, Loss of consciousness,  Tremor, insomnia, depression, anxiety, and suicidal ideation.      Objective:  BP 120/84 mmHg  Pulse 84  Temp(Src) 98.3 F (36.8 C) (Oral)  Resp 12  Ht 5' 4.25" (1.632 m)  Wt 120 lb 4 oz (54.545 kg)  BMI 20.48 kg/m2  SpO2 98%  Physical Exam  General appearance: alert, cooperative and appears stated age Head: Normocephalic, without obvious abnormality, atraumatic Eyes: conjunctivae/corneas clear. PERRL, EOM's intact. Fundi benign. Ears: normal TM's and external ear canals both ears Nose: Nares normal. Septum midline. Mucosa normal. No drainage or sinus tenderness. Throat: lips, mucosa, and tongue normal; teeth and gums normal Neck: no adenopathy, no carotid bruit, no JVD, supple, symmetrical, trachea midline and thyroid not enlarged, symmetric, no tenderness/mass/nodules Lungs: clear to auscultation bilaterally Breasts: normal appearance, no masses or tenderness Heart: regular rate and rhythm, S1, S2 normal, no murmur, click, rub or gallop Abdomen: soft, non-tender; bowel sounds normal; no masses,  no organomegaly Extremities: extremities normal, atraumatic, no cyanosis or edema Pulses: 2+ and symmetric Skin: Skin color,  texture, turgor normal. No rashes or lesions Neurologic: Alert and oriented X 3, normal strength and tone. Normal symmetric reflexes. Normal coordination and gait.    Assessment & Plan:   Problem List Items Addressed This Visit    Osteoporosis    Diagnosed in 2011.  She did not tolerate recent trial of Boniva due to persistent nausea, and had pill esophagitis with prior trial of alendronate in 2013.  T scores are unchanged by 2014 DEXA .   Needs PROLIA AUTHORIZATION         Visit for preventive health examination    Annual comprehensive preventive exam  was done as well as an evaluation and management of chronic conditions .  During the course of the visit the patient was educated and counseled about appropriate screening and preventive services including :  diabetes screening, lipid analysis with projected  10 year  risk for CAD , nutrition counseling, breast, cervical and colorectal cancer screening, and recommended immunizations.  Printed recommendations for health maintenance screenings was give      Community acquired pneumonia    Now clinically resolved.  Repeat chest x ray needed to ensure clearing of infiltrate.      Elevated LFTs    During acute illness,  Resolved   Lab Results  Component Value Date   ALT 17 05/02/2016   AST 23 05/02/2016   ALKPHOS 75 05/02/2016   BILITOT 0.6 05/02/2016         GERD (gastroesophageal reflux disease)    Continue PPI,  Add Mylanta Gas or Gaviscon.       Medicare annual wellness visit, initial    Annual Medicare wellness  exam was done as well as a comprehensive physical exam and management of acute and chronic conditions .  During the course of the visit the patient was educated and counseled about appropriate screening and preventive services including : fall prevention , diabetes screening, nutrition counseling, colorectal cancer screening, and recommended immunizations.  Printed recommendations for health maintenance screenings was given.   EKG was done today and is normal        Other Visit Diagnoses    Hyperlipidemia    -  Primary    Relevant Orders    Lipid panel (Completed)    Abnormal chest x-ray        Relevant Orders    DG Chest 2 View (Completed)    Elevated liver enzymes        Relevant Orders    Comp Met (CMET) (Completed)    Wellness examination        Relevant Orders    EKG 12-Lead (Completed)    Family history of early CAD        Relevant Orders    CT CARDIAC SCORING    Need for prophylactic vaccination against Streptococcus pneumoniae (pneumococcus)         Relevant Orders    Pneumococcal conjugate vaccine 13-valent (Completed)       I have discontinued Ms. Massing's levofloxacin. I am also having her maintain her (VITAMIN D, CHOLECALCIFEROL, PO), Calcium-Vitamin D-Vitamin K (VIACTIV PO), rosuvastatin, cholestyramine, fluticasone, meclizine, and omeprazole.  No orders of the defined types were placed in this encounter.    Medications Discontinued During This Encounter  Medication Reason  . levofloxacin (LEVAQUIN) 750 MG tablet Completed Course    Follow-up: No Follow-up on file.   Crecencio Mc, MD

## 2016-05-02 NOTE — Progress Notes (Signed)
Pre-visit discussion using our clinic review tool. No additional management support is needed unless otherwise documented below in the visit note.  

## 2016-05-02 NOTE — Patient Instructions (Signed)
Please bring me a copy of your living will when you have a chance   We will order your mammogram in September   Menopause is a normal process in which your reproductive ability comes to an end. This process happens gradually over a span of months to years, usually between the ages of 64 and 32. Menopause is complete when you have missed 12 consecutive menstrual periods. It is important to talk with your health care provider about some of the most common conditions that affect postmenopausal women, such as heart disease, cancer, and bone loss (osteoporosis). Adopting a healthy lifestyle and getting preventive care can help to promote your health and wellness. Those actions can also lower your chances of developing some of these common conditions. WHAT SHOULD I KNOW ABOUT MENOPAUSE? During menopause, you may experience a number of symptoms, such as:  Moderate-to-severe hot flashes.  Night sweats.  Decrease in sex drive.  Mood swings.  Headaches.  Tiredness.  Irritability.  Memory problems.  Insomnia. Choosing to treat or not to treat menopausal changes is an individual decision that you make with your health care provider. WHAT SHOULD I KNOW ABOUT HORMONE REPLACEMENT THERAPY AND SUPPLEMENTS? Hormone therapy products are effective for treating symptoms that are associated with menopause, such as hot flashes and night sweats. Hormone replacement carries certain risks, especially as you become older. If you are thinking about using estrogen or estrogen with progestin treatments, discuss the benefits and risks with your health care provider. WHAT SHOULD I KNOW ABOUT HEART DISEASE AND STROKE? Heart disease, heart attack, and stroke become more likely as you age. This may be due, in part, to the hormonal changes that your body experiences during menopause. These can affect how your body processes dietary fats, triglycerides, and cholesterol. Heart attack and stroke are both medical  emergencies. There are many things that you can do to help prevent heart disease and stroke:  Have your blood pressure checked at least every 1-2 years. High blood pressure causes heart disease and increases the risk of stroke.  If you are 41-76 years old, ask your health care provider if you should take aspirin to prevent a heart attack or a stroke.  Do not use any tobacco products, including cigarettes, chewing tobacco, or electronic cigarettes. If you need help quitting, ask your health care provider.  It is important to eat a healthy diet and maintain a healthy weight.  Be sure to include plenty of vegetables, fruits, low-fat dairy products, and lean protein.  Avoid eating foods that are high in solid fats, added sugars, or salt (sodium).  Get regular exercise. This is one of the most important things that you can do for your health.  Try to exercise for at least 150 minutes each week. The type of exercise that you do should increase your heart rate and make you sweat. This is known as moderate-intensity exercise.  Try to do strengthening exercises at least twice each week. Do these in addition to the moderate-intensity exercise.  Know your numbers.Ask your health care provider to check your cholesterol and your blood glucose. Continue to have your blood tested as directed by your health care provider. WHAT SHOULD I KNOW ABOUT CANCER SCREENING? There are several types of cancer. Take the following steps to reduce your risk and to catch any cancer development as early as possible. Breast Cancer  Practice breast self-awareness.  This means understanding how your breasts normally appear and feel.  It also means doing regular breast self-exams.  Let your health care provider know about any changes, no matter how small.  If you are 40 or older, have a clinician do a breast exam (clinical breast exam or CBE) every year. Depending on your age, family history, and medical history, it may  be recommended that you also have a yearly breast X-ray (mammogram).  If you have a family history of breast cancer, talk with your health care provider about genetic screening.  If you are at high risk for breast cancer, talk with your health care provider about having an MRI and a mammogram every year.  Breast cancer (BRCA) gene test is recommended for women who have family members with BRCA-related cancers. Results of the assessment will determine the need for genetic counseling and BRCA1 and for BRCA2 testing. BRCA-related cancers include these types:  Breast. This occurs in males or females.  Ovarian.  Tubal. This may also be called fallopian tube cancer.  Cancer of the abdominal or pelvic lining (peritoneal cancer).  Prostate.  Pancreatic. Cervical, Uterine, and Ovarian Cancer Your health care provider may recommend that you be screened regularly for cancer of the pelvic organs. These include your ovaries, uterus, and vagina. This screening involves a pelvic exam, which includes checking for microscopic changes to the surface of your cervix (Pap test).  For women ages 21-65, health care providers may recommend a pelvic exam and a Pap test every three years. For women ages 30-65, they may recommend the Pap test and pelvic exam, combined with testing for human papilloma virus (HPV), every five years. Some types of HPV increase your risk of cervical cancer. Testing for HPV may also be done on women of any age who have unclear Pap test results.  Other health care providers may not recommend any screening for nonpregnant women who are considered low risk for pelvic cancer and have no symptoms. Ask your health care provider if a screening pelvic exam is right for you.  If you have had past treatment for cervical cancer or a condition that could lead to cancer, you need Pap tests and screening for cancer for at least 20 years after your treatment. If Pap tests have been discontinued for you,  your risk factors (such as having a new sexual partner) need to be reassessed to determine if you should start having screenings again. Some women have medical problems that increase the chance of getting cervical cancer. In these cases, your health care provider may recommend that you have screening and Pap tests more often.  If you have a family history of uterine cancer or ovarian cancer, talk with your health care provider about genetic screening.  If you have vaginal bleeding after reaching menopause, tell your health care provider.  There are currently no reliable tests available to screen for ovarian cancer. Lung Cancer Lung cancer screening is recommended for adults 55-80 years old who are at high risk for lung cancer because of a history of smoking. A yearly low-dose CT scan of the lungs is recommended if you:  Currently smoke.  Have a history of at least 30 pack-years of smoking and you currently smoke or have quit within the past 15 years. A pack-year is smoking an average of one pack of cigarettes per day for one year. Yearly screening should:  Continue until it has been 15 years since you quit.  Stop if you develop a health problem that would prevent you from having lung cancer treatment. Colorectal Cancer  This type of cancer can be   detected and can often be prevented.  Routine colorectal cancer screening usually begins at age 50 and continues through age 75.  If you have risk factors for colon cancer, your health care provider may recommend that you be screened at an earlier age.  If you have a family history of colorectal cancer, talk with your health care provider about genetic screening.  Your health care provider may also recommend using home test kits to check for hidden blood in your stool.  A small camera at the end of a tube can be used to examine your colon directly (sigmoidoscopy or colonoscopy). This is done to check for the earliest forms of colorectal  cancer.  Direct examination of the colon should be repeated every 5-10 years until age 75. However, if early forms of precancerous polyps or small growths are found or if you have a family history or genetic risk for colorectal cancer, you may need to be screened more often. Skin Cancer  Check your skin from head to toe regularly.  Monitor any moles. Be sure to tell your health care provider:  About any new moles or changes in moles, especially if there is a change in a mole's shape or color.  If you have a mole that is larger than the size of a pencil eraser.  If any of your family members has a history of skin cancer, especially at a young age, talk with your health care provider about genetic screening.  Always use sunscreen. Apply sunscreen liberally and repeatedly throughout the day.  Whenever you are outside, protect yourself by wearing long sleeves, pants, a wide-brimmed hat, and sunglasses. WHAT SHOULD I KNOW ABOUT OSTEOPOROSIS? Osteoporosis is a condition in which bone destruction happens more quickly than new bone creation. After menopause, you may be at an increased risk for osteoporosis. To help prevent osteoporosis or the bone fractures that can happen because of osteoporosis, the following is recommended:  If you are 19-50 years old, get at least 1,000 mg of calcium and at least 600 mg of vitamin D per day.  If you are older than age 50 but younger than age 70, get at least 1,200 mg of calcium and at least 600 mg of vitamin D per day.  If you are older than age 70, get at least 1,200 mg of calcium and at least 800 mg of vitamin D per day. Smoking and excessive alcohol intake increase the risk of osteoporosis. Eat foods that are rich in calcium and vitamin D, and do weight-bearing exercises several times each week as directed by your health care provider. WHAT SHOULD I KNOW ABOUT HOW MENOPAUSE AFFECTS MY MENTAL HEALTH? Depression may occur at any age, but it is more common as  you become older. Common symptoms of depression include:  Low or sad mood.  Changes in sleep patterns.  Changes in appetite or eating patterns.  Feeling an overall lack of motivation or enjoyment of activities that you previously enjoyed.  Frequent crying spells. Talk with your health care provider if you think that you are experiencing depression. WHAT SHOULD I KNOW ABOUT IMMUNIZATIONS? It is important that you get and maintain your immunizations. These include:  Tetanus, diphtheria, and pertussis (Tdap) booster vaccine.  Influenza every year before the flu season begins.  Pneumonia vaccine.  Shingles vaccine. Your health care provider may also recommend other immunizations.   This information is not intended to replace advice given to you by your health care provider. Make sure you discuss any   questions you have with your health care provider.   Document Released: 11/22/2005 Document Revised: 10/21/2014 Document Reviewed: 06/02/2014 Elsevier Interactive Patient Education [employer].

## 2016-05-04 ENCOUNTER — Encounter: Payer: Self-pay | Admitting: Internal Medicine

## 2016-05-04 DIAGNOSIS — Z0000 Encounter for general adult medical examination without abnormal findings: Secondary | ICD-10-CM | POA: Insufficient documentation

## 2016-05-04 DIAGNOSIS — K219 Gastro-esophageal reflux disease without esophagitis: Secondary | ICD-10-CM | POA: Insufficient documentation

## 2016-05-04 DIAGNOSIS — Z1211 Encounter for screening for malignant neoplasm of colon: Secondary | ICD-10-CM | POA: Insufficient documentation

## 2016-05-04 NOTE — Assessment & Plan Note (Signed)
Annual comprehensive preventive exam was done as well as an evaluation and management of chronic conditions .  During the course of the visit the patient was educated and counseled about appropriate screening and preventive services including :  diabetes screening, lipid analysis with projected  10 year  risk for CAD , nutrition counseling, breast, cervical and colorectal cancer screening, and recommended immunizations.  Printed recommendations for health maintenance screenings was give 

## 2016-05-04 NOTE — Assessment & Plan Note (Signed)
Annual Medicare wellness  exam was done as well as a comprehensive physical exam and management of acute and chronic conditions .  During the course of the visit the patient was educated and counseled about appropriate screening and preventive services including : fall prevention , diabetes screening, nutrition counseling, colorectal cancer screening, and recommended immunizations.  Printed recommendations for health maintenance screenings was given.   EKG was done today and is normal

## 2016-05-04 NOTE — Assessment & Plan Note (Signed)
Now clinically resolved.  Repeat chest x ray needed to ensure clearing of infiltrate.

## 2016-05-04 NOTE — Assessment & Plan Note (Signed)
During acute illness,  Resolved   Lab Results  Component Value Date   ALT 17 05/02/2016   AST 23 05/02/2016   ALKPHOS 75 05/02/2016   BILITOT 0.6 05/02/2016

## 2016-05-04 NOTE — Assessment & Plan Note (Signed)
Continue PPI,  Add Mylanta Gas or Gaviscon.

## 2016-05-04 NOTE — Assessment & Plan Note (Signed)
Diagnosed in 2011.  She did not tolerate recent trial of Boniva due to persistent nausea, and had pill esophagitis with prior trial of alendronate in 2013.  T scores are unchanged by 2014 DEXA .   Needs PROLIA AUTHORIZATION  

## 2016-05-05 ENCOUNTER — Encounter: Payer: Self-pay | Admitting: Internal Medicine

## 2016-05-14 ENCOUNTER — Ambulatory Visit (INDEPENDENT_AMBULATORY_CARE_PROVIDER_SITE_OTHER)
Admission: RE | Admit: 2016-05-14 | Discharge: 2016-05-14 | Disposition: A | Discharge status: Home / Self Care | Payer: 59 | Source: Ambulatory Visit | Attending: Internal Medicine | Admitting: Internal Medicine

## 2016-05-14 DIAGNOSIS — Z8249 Family history of ischemic heart disease and other diseases of the circulatory system: Secondary | ICD-10-CM

## 2016-05-17 ENCOUNTER — Encounter: Payer: Self-pay | Admitting: Internal Medicine

## 2016-05-17 DIAGNOSIS — I77810 Thoracic aortic ectasia: Secondary | ICD-10-CM | POA: Insufficient documentation

## 2016-05-20 ENCOUNTER — Encounter: Payer: Self-pay | Admitting: Internal Medicine

## 2016-05-20 DIAGNOSIS — I77810 Thoracic aortic ectasia: Secondary | ICD-10-CM

## 2016-05-21 ENCOUNTER — Other Ambulatory Visit: Payer: Self-pay | Admitting: Internal Medicine

## 2016-05-24 ENCOUNTER — Other Ambulatory Visit: Payer: Self-pay | Admitting: Internal Medicine

## 2016-05-27 ENCOUNTER — Other Ambulatory Visit: Payer: Self-pay | Admitting: Internal Medicine

## 2016-05-27 DIAGNOSIS — Z1231 Encounter for screening mammogram for malignant neoplasm of breast: Secondary | ICD-10-CM

## 2016-06-10 NOTE — Telephone Encounter (Signed)
Mailed unread message to patient.  

## 2016-06-26 ENCOUNTER — Other Ambulatory Visit: Payer: Self-pay | Admitting: Internal Medicine

## 2016-06-26 DIAGNOSIS — Z1159 Encounter for screening for other viral diseases: Secondary | ICD-10-CM

## 2016-06-27 ENCOUNTER — Ambulatory Visit
Admission: RE | Admit: 2016-06-27 | Discharge: 2016-06-27 | Disposition: A | Discharge status: Home / Self Care | Payer: 59 | Source: Ambulatory Visit | Attending: Internal Medicine | Admitting: Internal Medicine

## 2016-06-27 DIAGNOSIS — Z1231 Encounter for screening mammogram for malignant neoplasm of breast: Secondary | ICD-10-CM

## 2016-07-01 ENCOUNTER — Encounter: Payer: Self-pay | Admitting: Internal Medicine

## 2016-07-17 ENCOUNTER — Ambulatory Visit (INDEPENDENT_AMBULATORY_CARE_PROVIDER_SITE_OTHER): Payer: 59

## 2016-07-17 DIAGNOSIS — M810 Age-related osteoporosis without current pathological fracture: Secondary | ICD-10-CM | POA: Diagnosis not present

## 2016-07-17 MED ORDER — DENOSUMAB 60 MG/ML ~~LOC~~ SOLN
60.0000 mg | Freq: Once | SUBCUTANEOUS | Status: AC
  Administered 2016-07-17: 60 mg via SUBCUTANEOUS

## 2016-07-17 NOTE — Progress Notes (Addendum)
Patient came in for Prolia injection.  Received in  L  Arm subcutaneously.  Patient tolerated well.    I have reviewed the above information and agree with above.   Duncan Dulleresa Tullo, MD

## 2016-09-01 ENCOUNTER — Encounter: Payer: Self-pay | Admitting: Internal Medicine

## 2016-09-30 ENCOUNTER — Encounter: Payer: Self-pay | Admitting: Internal Medicine

## 2016-11-04 ENCOUNTER — Encounter: Payer: Self-pay | Admitting: Internal Medicine

## 2016-11-11 ENCOUNTER — Telehealth: Payer: Self-pay | Admitting: Internal Medicine

## 2016-11-11 DIAGNOSIS — E785 Hyperlipidemia, unspecified: Secondary | ICD-10-CM

## 2016-11-11 DIAGNOSIS — E559 Vitamin D deficiency, unspecified: Secondary | ICD-10-CM

## 2016-11-11 DIAGNOSIS — R945 Abnormal results of liver function studies: Principal | ICD-10-CM

## 2016-11-11 DIAGNOSIS — R7989 Other specified abnormal findings of blood chemistry: Secondary | ICD-10-CM

## 2016-11-11 MED ORDER — OSELTAMIVIR PHOSPHATE 75 MG PO CAPS
75.0000 mg | ORAL_CAPSULE | Freq: Every day | ORAL | 0 refills | Status: DC
Start: 2016-11-11 — End: 2017-05-05

## 2016-11-11 NOTE — Telephone Encounter (Signed)
Pt called and stated that she just realized that Dr. Derrel Nip wanted her to come back in to have labs done. It looks there was an email from 7/23 stating this. Please advise, thank you!

## 2016-11-11 NOTE — Telephone Encounter (Signed)
Labs ordered.

## 2016-11-11 NOTE — Telephone Encounter (Signed)
Patient advised of below.  Patient inquired on scheduled 6 month fasting lab appointment from 04/2016 she wanted to go ahead and schedule appointment scheduled. Can you placed order for labs? Thanks.

## 2016-11-11 NOTE — Telephone Encounter (Signed)
Pt called and stated that her [facility] is [facility] and the flu is going around and they suggested that she call and see if Dr. Darrick Huntsmanullo would call her in Tamiflu. Pt is not currently having symptoms. Please advise, thank you!  Call pt @ [phone]

## 2016-11-11 NOTE — Telephone Encounter (Signed)
SENT,  1 TABLET DAILY X 10 DAYS

## 2016-11-12 NOTE — Telephone Encounter (Signed)
Lab orders are in. Left detailed mess informing pt.

## 2016-11-14 ENCOUNTER — Other Ambulatory Visit (INDEPENDENT_AMBULATORY_CARE_PROVIDER_SITE_OTHER): Payer: 59

## 2016-11-14 DIAGNOSIS — E785 Hyperlipidemia, unspecified: Secondary | ICD-10-CM | POA: Diagnosis not present

## 2016-11-14 DIAGNOSIS — R7989 Other specified abnormal findings of blood chemistry: Secondary | ICD-10-CM | POA: Diagnosis not present

## 2016-11-14 DIAGNOSIS — E559 Vitamin D deficiency, unspecified: Secondary | ICD-10-CM

## 2016-11-14 DIAGNOSIS — R945 Abnormal results of liver function studies: Principal | ICD-10-CM

## 2016-11-14 LAB — COMPREHENSIVE METABOLIC PANEL
ALT: 18 U/L (ref 0–35)
AST: 35 U/L (ref 0–37)
Albumin: 4.4 g/dL (ref 3.5–5.2)
Alkaline Phosphatase: 63 U/L (ref 39–117)
BUN: 13 mg/dL (ref 6–23)
CO2: 30 mEq/L (ref 19–32)
Calcium: 9.4 mg/dL (ref 8.4–10.5)
Chloride: 105 mEq/L (ref 96–112)
Creatinine, Ser: 0.91 mg/dL (ref 0.40–1.20)
GFR: 65.82 mL/min (ref >60.00)
Glucose, Bld: 88 mg/dL (ref 70–99)
Potassium: 4.5 mEq/L (ref 3.5–5.1)
Sodium: 140 mEq/L (ref 135–145)
Total Bilirubin: 0.5 mg/dL (ref 0.2–1.2)
Total Protein: 7.5 g/dL (ref 6.0–8.3)

## 2016-11-14 LAB — LIPID PANEL
Cholesterol: 245 mg/dL — ABNORMAL HIGH (ref 0–200)
HDL: 85.1 mg/dL (ref >39.00)
LDL Cholesterol: 139 mg/dL — ABNORMAL HIGH (ref 0–99)
NonHDL: 159.86
Total CHOL/HDL Ratio: 3
Triglycerides: 103 mg/dL (ref 0.0–149.0)
VLDL: 20.6 mg/dL (ref 0.0–40.0)

## 2016-11-14 LAB — VITAMIN D 25 HYDROXY (VIT D DEFICIENCY, FRACTURES): VITD: 36.61 ng/mL (ref 30.00–100.00)

## 2016-11-14 NOTE — Telephone Encounter (Signed)
Patient was informed that her information would be sent to the correct person to assist her with her billing.

## 2016-11-17 ENCOUNTER — Encounter: Payer: Self-pay | Admitting: Internal Medicine

## 2016-11-22 ENCOUNTER — Other Ambulatory Visit: Payer: Self-pay | Admitting: Internal Medicine

## 2016-12-27 ENCOUNTER — Other Ambulatory Visit: Payer: Self-pay | Admitting: Internal Medicine

## 2017-01-03 ENCOUNTER — Encounter: Payer: Self-pay | Admitting: Internal Medicine

## 2017-01-03 NOTE — Telephone Encounter (Signed)
I spoke with the patient, discussed that [facility] and [facility] are actively working on this and that her account will be placed on a hold so that there are no further implications with the bill.  She was very understanding.  Discussed that the Summary of benefits from the insurance was incorrect and that her current injection is due, that we have a co pay card and her out of pocket is only $65. She states that she will get the next one once this is completed.    I apologized to her that the bill has not been taken care of but reassured her that we are going to take care of it. She voiced understanding and thanked me for the call.

## 2017-01-13 ENCOUNTER — Encounter: Payer: Self-pay | Admitting: Internal Medicine

## 2017-02-12 ENCOUNTER — Encounter: Payer: Self-pay | Admitting: Internal Medicine

## 2017-03-05 ENCOUNTER — Other Ambulatory Visit: Payer: Self-pay | Admitting: Internal Medicine

## 2017-03-11 ENCOUNTER — Encounter: Payer: Self-pay | Admitting: Internal Medicine

## 2017-03-13 ENCOUNTER — Encounter: Payer: Self-pay | Admitting: Internal Medicine

## 2017-03-13 NOTE — Telephone Encounter (Signed)
Pt called wanting to speak with you. Please advise, thank you!  Call pt @ [phone]

## 2017-03-20 ENCOUNTER — Other Ambulatory Visit: Payer: Self-pay | Admitting: Cardiology

## 2017-03-20 DIAGNOSIS — I7121 Aneurysm of the ascending aorta, without rupture: Secondary | ICD-10-CM

## 2017-03-20 DIAGNOSIS — I719 Aortic aneurysm of unspecified site, without rupture: Secondary | ICD-10-CM

## 2017-04-25 ENCOUNTER — Telehealth: Payer: Self-pay | Admitting: *Deleted

## 2017-04-25 NOTE — Telephone Encounter (Signed)
Noted, thanks!

## 2017-04-25 NOTE — Telephone Encounter (Signed)
Noted.  Any change or if she changes her mind - do recommend evaluatoin.

## 2017-04-25 NOTE — Telephone Encounter (Signed)
Patient requested an appt with Dr. Darrick Huntsmanullo only, for next week to  discuss flank pain. Please give a time and date to place pt.  Pt contact [phone]

## 2017-04-25 NOTE — Telephone Encounter (Signed)
Last few weeks has had lower right sided pain stated  its more like pressure and bloating , decreased appetite, denies fever chills or other symptoms. HX irritable bowel. Advised patient she should be evaluated but patient refused stating she is out of town until late next week , patient will not see anyone but PCP so scheduled next available on 05/05/17.FYI

## 2017-04-28 ENCOUNTER — Encounter: Payer: Self-pay | Admitting: Internal Medicine

## 2017-05-05 ENCOUNTER — Ambulatory Visit (INDEPENDENT_AMBULATORY_CARE_PROVIDER_SITE_OTHER): Payer: 59 | Admitting: Internal Medicine

## 2017-05-05 ENCOUNTER — Encounter: Payer: Self-pay | Admitting: Internal Medicine

## 2017-05-05 ENCOUNTER — Telehealth: Payer: Self-pay | Admitting: *Deleted

## 2017-05-05 VITALS — BP 128/88 | HR 93 | Temp 98.5°F | Resp 15 | Ht 64.25 in | Wt 123.0 lb

## 2017-05-05 DIAGNOSIS — R140 Abdominal distension (gaseous): Secondary | ICD-10-CM

## 2017-05-05 DIAGNOSIS — R109 Unspecified abdominal pain: Secondary | ICD-10-CM

## 2017-05-05 DIAGNOSIS — R61 Generalized hyperhidrosis: Secondary | ICD-10-CM | POA: Diagnosis not present

## 2017-05-05 LAB — POCT URINALYSIS DIPSTICK
Bilirubin, UA: NEGATIVE
Blood, UA: NEGATIVE
Glucose, UA: NEGATIVE
Nitrite, UA: NEGATIVE
Protein, UA: NEGATIVE
Spec Grav, UA: 1.025 (ref 1.010–1.025)
Urobilinogen, UA: 2 E.U./dL — AB
pH, UA: 6 (ref 5.0–8.0)

## 2017-05-05 NOTE — Telephone Encounter (Signed)
Pt dropped off a bill, stating that Taylor [name] was working on resolving charges with Prolia injection. [name] will be given [name]

## 2017-05-05 NOTE — Patient Instructions (Addendum)
I have ordered a CT of your abdomen and pelvis along with labs today to rule out ovarian mass .    Diet for Irritable Bowel Syndrome When you have irritable bowel syndrome (IBS), the foods you eat and your eating habits are very important. IBS may cause various symptoms, such as abdominal pain, constipation, or diarrhea. Choosing the right foods can help ease discomfort caused by these symptoms. Work with your health care provider and dietitian to find the best eating plan to help control your symptoms. What general guidelines do I need to follow?  Keep a food diary. This will help you identify foods that cause symptoms. Write down: ? What you eat and when. ? What symptoms you have. ? When symptoms occur in relation to your meals.  Avoid foods that cause symptoms. Talk with your dietitian about other ways to get the same nutrients that are in these foods.  Eat more foods that contain fiber. Take a fiber supplement if directed by your dietitian.  Eat your meals slowly, in a relaxed setting.  Aim to eat 5-6 small meals per day. Do not skip meals.  Drink enough fluids to keep your urine clear or pale yellow.  Ask your health care provider if you should take an over-the-counter probiotic during flare-ups to help restore healthy gut bacteria.  If you have cramping or diarrhea, try making your meals low in fat and high in carbohydrates. Examples of carbohydrates are pasta, rice, whole grain breads and cereals, fruits, and vegetables.  If dairy products cause your symptoms to flare up, try eating less of them. You might be able to handle yogurt better than other dairy products because it contains bacteria that help with digestion. What foods are not recommended? The following are some foods and drinks that may worsen your symptoms:  Fatty foods, such as Jamaica[place] fries.  Milk products, such as cheese or ice cream.  Chocolate.  Alcohol.  Products with caffeine, such as  coffee.  Carbonated drinks, such as soda.  The items listed above may not be a complete list of foods and beverages to avoid. Contact your dietitian for more information. What foods are good sources of fiber? Your health care provider or dietitian may recommend that you eat more foods that contain fiber. Fiber can help reduce constipation and other IBS symptoms. Add foods with fiber to your diet a little at a time so that your body can get used to them. Too much fiber at once might cause gas and swelling of your abdomen. The following are some foods that are good sources of fiber:  Apples.  Peaches.  Pears.  Berries.  Figs.  Broccoli (raw).  Cabbage.  Carrots.  Raw peas.  Kidney beans.  Lima beans.  Whole grain bread.  Whole grain cereal.  Where to find more information: [employer] for Functional Gastrointestinal Disorders: www.iffgd.[employer] National Institute of Diabetes and Digestive and Kidney Diseases: http://norris-lawson.com/www.niddk.nih.gov/health-information/health-topics/digestive-diseases/ibs/Pages/facts.aspx This information is not intended to replace advice given to you by your health care provider. Make sure you discuss any questions you have with your health care provider. Document Released: 12/21/2003 Document Revised: 03/07/2016 Document Reviewed: 12/31/2013 Elsevier Interactive Patient Education  2018 [employer]

## 2017-05-05 NOTE — Progress Notes (Addendum)
Subjective:  Patient ID: Taylor [name], female    DOB: 11-24-1950  Age: 66 y.o. MRN: [id number]  CC: The primary encounter diagnosis was Flank pain. Diagnoses of Abdominal bloating with cramps, Night sweats, and Abdominal bloating were also pertinent to this visit.  HPI  Taylor [name] presents for EVALUATION OF right inguinal pain  AND BLOATING, back pain and night sweats.  Back pain has been intermitently aggravated by emptying bladder. symptoms present for the last 2 months .some bowle irregularity, which is chronic due to IBS>  Has gained some weight. Less diarrhea since taking questran .bloating occurs before the end of a meal.  Very uncomfortable,  Poor appetite,  Rare nausea without vomiting.  Some cold symptoms  stated last Thursday with bitemporal headache nonproductive cough sinus congestoin  Right ear pressure but improving    Last colo 2014 no tics  EGD : gastritis no sprue  In town for the next 2 weeks    No blood in urine   Last seen July 2017 for wellness visit.  Chest x ray was repeated to follow up on May 2017  LLL infiltrate, and had improved but  not resolved.  She did not have the repeat chest x ray that was ordered, but had a Cardiac CT in August 2017  that noted a 3 mm RML nodule . No follow up needed per radiology since she is a  NEVER SMOKER.     History of IBS  Outpatient Medications Prior to Visit  Medication Sig Dispense Refill  . Calcium-Vitamin D-Vitamin K (VIACTIV PO) Take as directed    . cholestyramine (QUESTRAN) 4 GM/DOSE powder USE AS PER PACKAGE INSTRUCTIONS TAKE ONESCOOPFUL THREE TIMES A DAY WITH MEALS 378 g 6  . fluticasone (FLONASE) 50 MCG/ACT nasal spray USE 2 SPRAYS IN EACH NOSTRIL EVERY DAY 16 g 2  . omeprazole (PRILOSEC) 40 MG capsule TAKE 1 CAPSULE BY MOUTH DAILY BEFORE BREAKFAST 30 capsule 3  . rosuvastatin (CRESTOR) 10 MG tablet TAKE ONE TABLET EACH NIGHT 30 tablet 5  . VITAMIN D, CHOLECALCIFEROL, PO Take one by mouth daily    . meclizine  (ANTIVERT) 25 MG tablet Take 1 tablet (25 mg total) by mouth 3 (three) times daily as needed for dizziness. (Patient not taking: Reported on 05/02/2016) 30 tablet 0  . oseltamivir (TAMIFLU) 75 MG capsule Take 1 capsule (75 mg total) by mouth daily. (Patient not taking: Reported on 05/05/2017) 10 capsule 0   No facility-administered medications prior to visit.     Review of Systems;  Patient denies headache, fevers, malaise, unintentional weight loss, skin rash, eye pain, sinus congestion and sinus pain, sore throat, dysphagia,  hemoptysis , cough, dyspnea, wheezing, chest pain, palpitations, orthopnea, edema, abdominal pain, nausea, melena, diarrhea, constipation, flank pain, dysuria, hematuria, urinary  Frequency, nocturia, numbness, tingling, seizures,  Focal weakness, Loss of consciousness,  Tremor, insomnia, depression, anxiety, and suicidal ideation.      Objective:  BP 128/88 (BP Location: Left Arm, Patient Position: Sitting, Cuff Size: Normal)   Pulse 93   Temp 98.5 F (36.9 C) (Oral)   Resp 15   Ht 5' 4.25" (1.632 m)   Wt 123 lb (55.8 kg)   SpO2 98%   BMI 20.95 kg/m   BP Readings from Last 3 Encounters:  05/28/17 124/86  05/05/17 128/88  05/02/16 120/84    Wt Readings from Last 3 Encounters:  05/28/17 122 lb 12.8 oz (55.7 kg)  05/05/17 123 lb (55.8 kg)  05/02/16  120 lb 4 oz (54.5 kg)    General appearance: alert, cooperative and appears stated age Ears: normal TM's and external ear canals both ears Throat: lips, mucosa, and tongue normal; teeth and gums normal Neck: no adenopathy, no carotid bruit, supple, symmetrical, trachea midline and thyroid not enlarged, symmetric, no tenderness/mass/nodules Back: symmetric, no curvature. ROM normal. No CVA tenderness. Lungs: clear to auscultation bilaterally Heart: regular rate and rhythm, S1, S2 normal, no murmur, click, rub or gallop Abdomen: soft, non-tender; bowel sounds normal; no masses,  no organomegaly Pulses: 2+ and  symmetric Skin: Skin color, texture, turgor normal. No rashes or lesions Lymph nodes: Cervical, supraclavicular, and axillary nodes normal.  No results found for: HGBA1C  Lab Results  Component Value Date   CREATININE 0.88 05/05/2017   CREATININE 0.91 11/14/2016   CREATININE 0.88 05/02/2016    Lab Results  Component Value Date   WBC 8.0 05/05/2017   HGB 12.9 05/05/2017   HCT 38.9 05/05/2017   PLT 281.0 05/05/2017   GLUCOSE 96 05/05/2017   CHOL 245 (H) 11/14/2016   TRIG 103.0 11/14/2016   HDL 85.10 11/14/2016   LDLDIRECT 122.6 04/23/2013   LDLCALC 139 (H) 11/14/2016   ALT 19 05/05/2017   AST 26 05/05/2017   NA 138 05/05/2017   K 3.9 05/05/2017   CL 104 05/05/2017   CREATININE 0.88 05/05/2017   BUN 16 05/05/2017   CO2 26 05/05/2017   TSH 2.71 05/05/2017    Mm Screening Breast Tomo Bilateral  Result Date: 06/27/2016 CLINICAL DATA:  Screening. EXAM: 2D DIGITAL SCREENING BILATERAL MAMMOGRAM WITH CAD AND ADJUNCT TOMO COMPARISON:  Previous exam(s). ACR Breast Density Category c: The breast tissue is heterogeneously dense, which may obscure small masses. FINDINGS: There are no findings suspicious for malignancy. Images were processed with CAD. IMPRESSION: No mammographic evidence of malignancy. A result letter of this screening mammogram will be mailed directly to the patient. RECOMMENDATION: Screening mammogram in one year. (Code:SM-B-01Y) BI-RADS CATEGORY  1: Negative. Electronically Signed   By: Beckie SaltsSteven  Reid M.D.   On: 07/02/2016 07:52    Assessment & Plan:   Problem List Items Addressed This Visit    Abdominal bloating    CA 127 and abd CT ordered to rule out ovarian mass  Diet for IBS given        Other Visit Diagnoses    Flank pain    -  Primary   Relevant Orders   Urine Culture (Completed)   Urine Microscopic Only (Completed)   POCT Urinalysis Dipstick (Completed)   CT ABDOMEN PELVIS W WO CONTRAST   Abdominal bloating with cramps       Relevant Orders   CA 125  (Completed)   Comprehensive metabolic panel (Completed)   CT ABDOMEN PELVIS W WO CONTRAST   Night sweats       Relevant Orders   TSH (Completed)   CBC with Differential/Platelet (Completed)   CT ABDOMEN PELVIS W WO CONTRAST      I have discontinued Ms. Raptis's meclizine and oseltamivir. I am also having her maintain her (VITAMIN D, CHOLECALCIFEROL, PO), Calcium-Vitamin D-Vitamin K (VIACTIV PO), cholestyramine, rosuvastatin, fluticasone, and omeprazole.  No orders of the defined types were placed in this encounter.   Medications Discontinued During This Encounter  Medication Reason  . meclizine (ANTIVERT) 25 MG tablet Patient has not taken in last 30 days  . oseltamivir (TAMIFLU) 75 MG capsule Patient has not taken in last 30 days    Follow-up: No Follow-up on  file.   Crecencio Mc, MD

## 2017-05-05 NOTE — Telephone Encounter (Signed)
Discussed with JB, I have exhausted options to pay the reminder of her bill. I am not sure how to help her, thanks

## 2017-05-06 DIAGNOSIS — R140 Abdominal distension (gaseous): Secondary | ICD-10-CM | POA: Insufficient documentation

## 2017-05-06 LAB — CBC WITH DIFFERENTIAL/PLATELET
Basophils Absolute: 0 10*3/uL (ref 0.0–0.1)
Basophils Relative: 0.4 % (ref 0.0–3.0)
Eosinophils Absolute: 0.1 10*3/uL (ref 0.0–0.7)
Eosinophils Relative: 1 % (ref 0.0–5.0)
HCT: 38.9 % (ref 36.0–46.0)
Hemoglobin: 12.9 g/dL (ref 12.0–15.0)
Lymphocytes Relative: 34.2 % (ref 12.0–46.0)
Lymphs Abs: 2.7 10*3/uL (ref 0.7–4.0)
MCHC: 33.1 g/dL (ref 30.0–36.0)
MCV: 94.8 fl (ref 78.0–100.0)
Monocytes Absolute: 0.8 10*3/uL (ref 0.1–1.0)
Monocytes Relative: 9.5 % (ref 3.0–12.0)
Neutro Abs: 4.4 10*3/uL (ref 1.4–7.7)
Neutrophils Relative %: 54.9 % (ref 43.0–77.0)
Platelets: 281 10*3/uL (ref 150.0–400.0)
RBC: 4.11 Mil/uL (ref 3.87–5.11)
RDW: 13.9 % (ref 11.5–15.5)
WBC: 8 10*3/uL (ref 4.0–10.5)

## 2017-05-06 LAB — COMPREHENSIVE METABOLIC PANEL
ALT: 19 U/L (ref 0–35)
AST: 26 U/L (ref 0–37)
Albumin: 4.2 g/dL (ref 3.5–5.2)
Alkaline Phosphatase: 82 U/L (ref 39–117)
BUN: 16 mg/dL (ref 6–23)
CO2: 26 mEq/L (ref 19–32)
Calcium: 9.9 mg/dL (ref 8.4–10.5)
Chloride: 104 mEq/L (ref 96–112)
Creatinine, Ser: 0.88 mg/dL (ref 0.40–1.20)
GFR: 68.32 mL/min (ref >60.00)
Glucose, Bld: 96 mg/dL (ref 70–99)
Potassium: 3.9 mEq/L (ref 3.5–5.1)
Sodium: 138 mEq/L (ref 135–145)
Total Bilirubin: 0.3 mg/dL (ref 0.2–1.2)
Total Protein: 7.4 g/dL (ref 6.0–8.3)

## 2017-05-06 LAB — TSH: TSH: 2.71 u[IU]/mL (ref 0.35–4.50)

## 2017-05-06 LAB — URINALYSIS, MICROSCOPIC ONLY: WBC, UA: NONE SEEN (ref 0–?)

## 2017-05-06 NOTE — Assessment & Plan Note (Signed)
CA 127 and abd CT ordered to rule out ovarian mass  Diet for IBS given

## 2017-05-07 ENCOUNTER — Encounter: Payer: Self-pay | Admitting: Internal Medicine

## 2017-05-07 LAB — CA 125: CA 125: 12 U/mL (ref <35)

## 2017-05-07 LAB — URINE CULTURE: Organism ID, Bacteria: NO GROWTH

## 2017-05-08 ENCOUNTER — Telehealth: Payer: Self-pay

## 2017-05-08 DIAGNOSIS — R140 Abdominal distension (gaseous): Secondary | ICD-10-CM

## 2017-05-08 DIAGNOSIS — R61 Generalized hyperhidrosis: Secondary | ICD-10-CM

## 2017-05-08 DIAGNOSIS — R109 Unspecified abdominal pain: Secondary | ICD-10-CM

## 2017-05-08 NOTE — Telephone Encounter (Signed)
Order has been changed and Melissa has been notified.

## 2017-05-14 ENCOUNTER — Ambulatory Visit: Payer: 59

## 2017-05-14 ENCOUNTER — Ambulatory Visit
Admission: RE | Admit: 2017-05-14 | Discharge: 2017-05-14 | Disposition: A | Discharge status: Home / Self Care | Payer: Commercial Managed Care - HMO | Source: Ambulatory Visit | Attending: Internal Medicine | Admitting: Internal Medicine

## 2017-05-14 DIAGNOSIS — R61 Generalized hyperhidrosis: Secondary | ICD-10-CM | POA: Diagnosis not present

## 2017-05-14 DIAGNOSIS — R109 Unspecified abdominal pain: Secondary | ICD-10-CM | POA: Insufficient documentation

## 2017-05-14 DIAGNOSIS — R140 Abdominal distension (gaseous): Secondary | ICD-10-CM

## 2017-05-14 MED ORDER — IOPAMIDOL (ISOVUE-300) INJECTION 61%: 125.0000 mL | Freq: Once | INTRAVENOUS | Status: DC | PRN

## 2017-05-14 MED ORDER — IOPAMIDOL (ISOVUE-300) INJECTION 61%
100.0000 mL | Freq: Once | INTRAVENOUS | Status: AC | PRN
  Administered 2017-05-14: 100 mL via INTRAVENOUS

## 2017-05-15 ENCOUNTER — Encounter: Payer: Self-pay | Admitting: Internal Medicine

## 2017-05-26 ENCOUNTER — Ambulatory Visit: Payer: Medicare Other

## 2017-05-28 ENCOUNTER — Ambulatory Visit
Admission: RE | Admit: 2017-05-28 | Discharge: 2017-05-28 | Disposition: A | Discharge status: Home / Self Care | Payer: 59 | Source: Ambulatory Visit | Attending: Cardiology | Admitting: Cardiology

## 2017-05-28 ENCOUNTER — Encounter: Payer: Self-pay | Admitting: Internal Medicine

## 2017-05-28 ENCOUNTER — Ambulatory Visit (INDEPENDENT_AMBULATORY_CARE_PROVIDER_SITE_OTHER): Payer: 59 | Admitting: Internal Medicine

## 2017-05-28 ENCOUNTER — Ambulatory Visit (INDEPENDENT_AMBULATORY_CARE_PROVIDER_SITE_OTHER): Payer: 59

## 2017-05-28 VITALS — BP 124/86 | HR 81 | Temp 98.1°F | Resp 14 | Ht 64.0 in | Wt 122.8 lb

## 2017-05-28 DIAGNOSIS — R918 Other nonspecific abnormal finding of lung field: Secondary | ICD-10-CM | POA: Insufficient documentation

## 2017-05-28 DIAGNOSIS — I77810 Thoracic aortic ectasia: Secondary | ICD-10-CM | POA: Diagnosis not present

## 2017-05-28 DIAGNOSIS — I719 Aortic aneurysm of unspecified site, without rupture: Secondary | ICD-10-CM | POA: Insufficient documentation

## 2017-05-28 DIAGNOSIS — G8929 Other chronic pain: Secondary | ICD-10-CM | POA: Diagnosis not present

## 2017-05-28 DIAGNOSIS — Z1231 Encounter for screening mammogram for malignant neoplasm of breast: Secondary | ICD-10-CM

## 2017-05-28 DIAGNOSIS — R109 Unspecified abdominal pain: Secondary | ICD-10-CM | POA: Diagnosis not present

## 2017-05-28 DIAGNOSIS — Z9049 Acquired absence of other specified parts of digestive tract: Secondary | ICD-10-CM | POA: Diagnosis not present

## 2017-05-28 DIAGNOSIS — I7121 Aneurysm of the ascending aorta, without rupture: Secondary | ICD-10-CM

## 2017-05-28 DIAGNOSIS — Z0000 Encounter for general adult medical examination without abnormal findings: Secondary | ICD-10-CM

## 2017-05-28 DIAGNOSIS — R10A1 Flank pain, right side: Secondary | ICD-10-CM

## 2017-05-28 DIAGNOSIS — R140 Abdominal distension (gaseous): Secondary | ICD-10-CM

## 2017-05-28 DIAGNOSIS — Z1239 Encounter for other screening for malignant neoplasm of breast: Secondary | ICD-10-CM

## 2017-05-28 MED ORDER — LACTULOSE 20 GM/30ML PO SOLN: ORAL | 3 refills | Status: DC

## 2017-05-28 MED ORDER — RALOXIFENE HCL 60 MG PO TABS: 60.0000 mg | ORAL_TABLET | Freq: Every day | ORAL | 5 refills | Status: DC

## 2017-05-28 MED ORDER — IOPAMIDOL (ISOVUE-370) INJECTION 76%
75.0000 mL | Freq: Once | INTRAVENOUS | Status: AC | PRN
  Administered 2017-05-28: 75 mL via INTRAVENOUS

## 2017-05-28 NOTE — Progress Notes (Signed)
Patient ID: Taylor [name], female    DOB: [birth year]  Age: 66 y.o. MRN: [id number]  The patient is here for annual preventive  examination and management of other chronic and acute problems.   The risk factors are reflected in the social history.  The roster of all physicians providing medical care to patient - is listed in the Snapshot section of the chart.  Activities of daily living:  The patient is 100% independent in all ADLs: dressing, toileting, feeding as well as independent mobility  Home safety : The patient has smoke detectors in the home. They wear seatbelts.  There are no firearms at home. There is no violence in the home.   There is no risks for hepatitis, STDs or HIV. There is no   history of blood transfusion. They have no travel history to infectious disease endemic areas of the world.  The patient has seen their dentist in the last six month. They have seen their eye doctor in the last year. They admit to slight hearing difficulty with regard to whispered voices and some television programs.  They have deferred audiologic testing in the last year.  They do not  have excessive sun exposure. Discussed the need for sun protection: hats, long sleeves and use of sunscreen if there is significant sun exposure.   Diet: the importance of a healthy diet is discussed. They do have a healthy diet.  The benefits of regular aerobic exercise were discussed. She walks 4 times per week ,  20 minutes.   Depression screen: there are no signs or vegative symptoms of depression- irritability, change in appetite, anhedonia, sadness/tearfullness.  Cognitive assessment: the patient manages all their financial and personal affairs and is actively engaged. They could relate day,date,year and events; recalled 2/3 objects at 3 minutes; performed clock-face test normally.  The following portions of the patient's history were reviewed and updated as appropriate: allergies, current medications, past family  history, past medical history,  past surgical history, past social history  and problem list.  Visual acuity was not assessed per patient preference since she has regular follow up with her ophthalmologist. Hearing and body mass index were assessed and reviewed.   During the course of the visit the patient was educated and counseled about appropriate screening and preventive services including : fall prevention , diabetes screening, nutrition counseling, colorectal cancer screening, and recommended immunizations.    CC: The primary encounter diagnosis was Breast cancer screening. Diagnoses of Chronic right flank pain, Visit for preventive health examination, Aortic root dilatation (HCC), and Abdominal bloating were also pertinent to this visit.  Evaluated in late July for flank pain and bloating.  CA 125 and CT done to rule out ovarian mass.  Constipation suggested by CT scan.  Pain has persisted, but is intermittent , not aggravated by eating or stooling .Marland Kitchen    Osteoporosis :  She was billed for her  prolia injection in October  With an out of pocket cost of $400, which was initally $2300She was told it would cost $40 out of pocket. Marland Kitchen Has not had one since     Had CT angiogram ordered by Fath , done today  To evaluate her aorta and pulmonary  nodules.    [name] has a past medical history of Chest pain; Fever blister; Hyperlipidemia; Irritable bowel syndrome; Mitral valve prolapse; Normal cardiac stress test (2007); and Osteoporosis.   She has a past surgical history that includes Cholecystectomy (Approx - 2002).   Her  family history includes Aortic aneurysm in her paternal grandfather; Heart disease (age of onset: 65) in her father; Heart disease (age of onset: 76) in her brother; Multiple sclerosis in her mother.She reports that she has never smoked. She has never used smokeless tobacco. She reports that she drinks about 1.8 oz of alcohol per week . She reports that she does not use  drugs.  Outpatient Medications Prior to Visit  Medication Sig Dispense Refill  . Calcium-Vitamin D-Vitamin K (VIACTIV PO) Take as directed    . cholestyramine (QUESTRAN) 4 GM/DOSE powder USE AS PER PACKAGE INSTRUCTIONS TAKE ONESCOOPFUL THREE TIMES A DAY WITH MEALS 378 g 6  . fluticasone (FLONASE) 50 MCG/ACT nasal spray USE 2 SPRAYS IN EACH NOSTRIL EVERY DAY 16 g 2  . omeprazole (PRILOSEC) 40 MG capsule TAKE 1 CAPSULE BY MOUTH DAILY BEFORE BREAKFAST 30 capsule 3  . rosuvastatin (CRESTOR) 10 MG tablet TAKE ONE TABLET EACH NIGHT 30 tablet 5  . VITAMIN D, CHOLECALCIFEROL, PO Take one by mouth daily     No facility-administered medications prior to visit.     Review of Systems   Patient denies headache, fevers, malaise, unintentional weight loss, skin rash, eye pain, sinus congestion and sinus pain, sore throat, dysphagia,  hemoptysis , cough, dyspnea, wheezing, chest pain, palpitations, orthopnea, edema, abdominal pain, nausea, melena, diarrhea, constipation, flank pain, dysuria, hematuria, urinary  Frequency, nocturia, numbness, tingling, seizures,  Focal weakness, Loss of consciousness,  Tremor, insomnia, depression, anxiety, and suicidal ideation.      Objective:  BP 124/86 (BP Location: Left Arm, Patient Position: Sitting, Cuff Size: Normal)   Pulse 81   Temp 98.1 F (36.7 C) (Oral)   Resp 14   Ht 5\' 4"  (1.626 m)   Wt 122 lb 12.8 oz (55.7 kg)   SpO2 97%   BMI 21.08 kg/m   Physical Exam   General appearance: alert, cooperative and appears stated age Head: Normocephalic, without obvious abnormality, atraumatic Eyes: conjunctivae/corneas clear. PERRL, EOM's intact. Fundi benign. Ears: normal TM's and external ear canals both ears Nose: Nares normal. Septum midline. Mucosa normal. No drainage or sinus tenderness. Throat: lips, mucosa, and tongue normal; teeth and gums normal Neck: no adenopathy, no carotid bruit, no JVD, supple, symmetrical, trachea midline and thyroid not  enlarged, symmetric, no tenderness/mass/nodules Lungs: clear to auscultation bilaterally Breasts: normal appearance, no masses or tenderness Heart: regular rate and rhythm, S1, S2 normal, no murmur, click, rub or gallop Abdomen: soft, non-tender; bowel sounds normal; no masses,  no organomegaly Extremities: extremities normal, atraumatic, no cyanosis or edema Pulses: 2+ and symmetric Skin: Skin color, texture, turgor normal. No rashes or lesions Neurologic: Alert and oriented X 3, normal strength and tone. Normal symmetric reflexes. Normal coordination and gait.      Assessment & Plan:   Problem List Items Addressed This Visit    Visit for preventive health examination    Annual comprehensive preventive exam was done as well as an evaluation and management of chronic conditions .  During the course of the visit the patient was educated and counseled about appropriate screening and preventive services including :  diabetes screening, lipid analysis with projected  10 year  risk for CAD , nutrition counseling, breast, cervical and colorectal cancer screening, and recommended immunizations.  Printed recommendations for health maintenance screenings was givem      Chronic right flank pain    Her pain appears to be MSK in origin.  Lumbar spine films done today not degenerative changes,  disk space narrowing and endplate osteophyte changes. PT advised       Relevant Orders   DG Lumbar Spine Complete (Completed)   Aortic root dilatation (HCC)    Follow up CT done today shows no change       Abdominal bloating    Ovarian  CA ruled out with CA 125 and CT .  bloating is likely due to her IBS       Other Visit Diagnoses    Breast cancer screening    -  Primary   Relevant Orders   MM SCREENING BREAST TOMO BILATERAL      I am having Ms. Pellow start on raloxifene and Lactulose. I am also having her maintain her (VITAMIN D, CHOLECALCIFEROL, PO), Calcium-Vitamin D-Vitamin K (VIACTIV PO),  cholestyramine, rosuvastatin, fluticasone, and omeprazole.  Meds ordered this encounter  Medications  . raloxifene (EVISTA) 60 MG tablet    Sig: Take 1 tablet (60 mg total) by mouth daily.    Dispense:  30 tablet    Refill:  5  . Lactulose 20 GM/30ML SOLN    Sig: 30 ml every 4 hours until constipation is relieved    Dispense:  236 mL    Refill:  3    There are no discontinued medications.  Follow-up: No Follow-up on file.   Sherlene ShamsULLO, TERESA L, MD

## 2017-05-28 NOTE — Patient Instructions (Addendum)
Tyr Gas X or Phasyme for post meal bloating  Also try organic apple cider 1 tablespoon in 6 ounces water    Plain films lumbar spine films today   Trial of lactulose once a week for constipation seen on your CT scan   Trial of Evista instead of Prolia for osteoporosis     Review the Low FODMAP Food list ; consider a trial to see if it improves your IBS symptoms

## 2017-05-29 ENCOUNTER — Encounter: Payer: Self-pay | Admitting: Internal Medicine

## 2017-05-29 DIAGNOSIS — G8929 Other chronic pain: Secondary | ICD-10-CM | POA: Insufficient documentation

## 2017-05-29 DIAGNOSIS — R109 Unspecified abdominal pain: Secondary | ICD-10-CM

## 2017-05-29 NOTE — Assessment & Plan Note (Addendum)
Annual comprehensive preventive exam was done as well as an evaluation and management of chronic conditions .  During the course of the visit the patient was educated and counseled about appropriate screening and preventive services including :  diabetes screening, lipid analysis with projected  10 year  risk for CAD , nutrition counseling, breast, cervical and colorectal cancer screening, and recommended immunizations.  Printed recommendations for health maintenance screenings was givem 

## 2017-05-29 NOTE — Assessment & Plan Note (Signed)
Follow up CT done today shows no change

## 2017-05-29 NOTE — Assessment & Plan Note (Signed)
Ovarian  CA ruled out with CA 125 and CT .  bloating is likely due to her IBS

## 2017-05-29 NOTE — Assessment & Plan Note (Signed)
Her pain appears to be MSK in origin.  Lumbar spine films done today not degenerative changes, disk space narrowing and endplate osteophyte changes. PT advised

## 2017-06-02 ENCOUNTER — Encounter: Payer: Self-pay | Admitting: Internal Medicine

## 2017-06-03 ENCOUNTER — Encounter: Payer: Self-pay | Admitting: Internal Medicine

## 2017-06-30 ENCOUNTER — Ambulatory Visit
Admission: RE | Admit: 2017-06-30 | Discharge: 2017-06-30 | Disposition: A | Discharge status: Home / Self Care | Payer: 59 | Source: Ambulatory Visit | Attending: Internal Medicine | Admitting: Internal Medicine

## 2017-06-30 DIAGNOSIS — Z1231 Encounter for screening mammogram for malignant neoplasm of breast: Secondary | ICD-10-CM | POA: Diagnosis not present

## 2017-06-30 DIAGNOSIS — Z1239 Encounter for other screening for malignant neoplasm of breast: Secondary | ICD-10-CM

## 2017-07-02 ENCOUNTER — Other Ambulatory Visit: Payer: Self-pay | Admitting: Internal Medicine

## 2017-07-02 DIAGNOSIS — R928 Other abnormal and inconclusive findings on diagnostic imaging of breast: Secondary | ICD-10-CM

## 2017-07-02 DIAGNOSIS — N6489 Other specified disorders of breast: Secondary | ICD-10-CM

## 2017-07-10 ENCOUNTER — Ambulatory Visit
Admission: RE | Admit: 2017-07-10 | Discharge: 2017-07-10 | Disposition: A | Discharge status: Home / Self Care | Payer: 59 | Source: Ambulatory Visit | Attending: Internal Medicine | Admitting: Internal Medicine

## 2017-07-10 DIAGNOSIS — N6489 Other specified disorders of breast: Secondary | ICD-10-CM

## 2017-07-10 DIAGNOSIS — R928 Other abnormal and inconclusive findings on diagnostic imaging of breast: Secondary | ICD-10-CM | POA: Diagnosis present

## 2017-07-23 ENCOUNTER — Other Ambulatory Visit: Payer: Self-pay | Admitting: Internal Medicine

## 2017-10-06 ENCOUNTER — Other Ambulatory Visit: Payer: Self-pay | Admitting: Internal Medicine

## 2017-10-06 DIAGNOSIS — Z1159 Encounter for screening for other viral diseases: Secondary | ICD-10-CM

## 2017-12-12 ENCOUNTER — Encounter: Payer: Self-pay | Admitting: Internal Medicine

## 2017-12-31 ENCOUNTER — Other Ambulatory Visit: Payer: Self-pay | Admitting: Internal Medicine

## 2017-12-31 MED ORDER — OMEPRAZOLE 40 MG PO CPDR: DELAYED_RELEASE_CAPSULE | ORAL | 3 refills | Status: DC

## 2017-12-31 NOTE — Telephone Encounter (Signed)
Copied from CRM [phone]#[id number]. Topic: General - Other >> Dec 31, 2017  1:46 PM Cecelia ByarsGreen, Analiyah Lechuga L, RMA wrote: Reason for CRM: Medication refill request for omeprazole (PRILOSEC) 40 MG to be sent to [facility] [address]

## 2017-12-31 NOTE — Telephone Encounter (Signed)
Request for omeprazole  LOV  05/28/17 NOV  06/03/18  Provider  T.Darrick Huntsmanullo, MD  Last refill 07/23/17 with 3 refills  Med pended

## 2017-12-31 NOTE — Telephone Encounter (Signed)
Prilosec refilled.

## 2018-01-18 ENCOUNTER — Encounter: Payer: Self-pay | Admitting: Internal Medicine

## 2018-01-19 ENCOUNTER — Telehealth: Payer: Self-pay

## 2018-01-19 NOTE — Telephone Encounter (Signed)
Patient called and stated that Dr. Darrick Huntsmanullo informed her that [facility] her [facility] up this week and was wanting to know when should she be expecting [facility] to call her about moving that appointment.

## 2018-01-19 NOTE — Telephone Encounter (Signed)
Pt has been scheduled for 01/21/2018 @ 11:30am. Pt is aware of appt date and time.

## 2018-01-21 ENCOUNTER — Ambulatory Visit: Payer: BLUE CROSS/BLUE SHIELD | Admitting: Internal Medicine

## 2018-01-21 ENCOUNTER — Other Ambulatory Visit: Payer: Self-pay

## 2018-01-21 ENCOUNTER — Encounter: Payer: Self-pay | Admitting: Internal Medicine

## 2018-01-21 VITALS — BP 128/86 | HR 92 | Wt 122.6 lb

## 2018-01-21 DIAGNOSIS — N644 Mastodynia: Secondary | ICD-10-CM

## 2018-01-21 DIAGNOSIS — R52 Pain, unspecified: Secondary | ICD-10-CM | POA: Diagnosis not present

## 2018-01-21 MED ORDER — FUROSEMIDE 20 MG PO TABS: 20.0000 mg | ORAL_TABLET | Freq: Every day | ORAL | 0 refills | Status: DC

## 2018-01-21 NOTE — Patient Instructions (Signed)
I think the Evista may be responsible for your breast pain , but I have ordered the mammograms    I will recommend a few days of furosemide to see if the pain resolves with diuresis   Do not resume the Evista until the furosemide trial  And the mammograms have  been done

## 2018-01-21 NOTE — Progress Notes (Signed)
Subjective:  Patient ID: Taylor [name], female    DOB: 05/26/1951  Age: 67 y.o. MRN: [id number]  CC: The primary encounter diagnosis was Acute breast pain. A diagnosis of Breast pain was also pertinent to this visit.  HPI Taylor [name] presents for diffuse breast pain for the last 3 weeks.  Started in left breast,  Now involving the right,  Thought she noticed  some redness at the medial border of her aureola  on left side .  Hurts to roll over on them in bed.    History of abnormal mammogram in Sept 2018,  Radiologist noted an asymmetry  Which resolved with unilateral films,  Was told to follow up in one year with screening   Had a prolonged febrile illness 6-7 weeks ago that was treated with doxycycline   Started taking Evista in early March ,  Stopped it after 2 weeks  (about the time the breast pain started )  Has been reading on the internet about inflammatory breast cancer and worried that she has it  Outpatient Medications Prior to Visit  Medication Sig Dispense Refill  . Calcium-Vitamin D-Vitamin K (VIACTIV PO) Take as directed    . cholestyramine (QUESTRAN) 4 GM/DOSE powder USE AS PER PACKAGE INSTRUCTIONS TAKE ONESCOOPFUL THREE TIMES A DAY WITH MEALS 378 g 3  . fluticasone (FLONASE) 50 MCG/ACT nasal spray USE 2 SPRAYS IN EACH NOSTRIL EVERY DAY 16 g 2  . omeprazole (PRILOSEC) 40 MG capsule TAKE 1 CAPSULE BY MOUTH DAILY BEFORE BREAKFAST 30 capsule 3  . raloxifene (EVISTA) 60 MG tablet Take 1 tablet (60 mg total) by mouth daily. 30 tablet 5  . rosuvastatin (CRESTOR) 10 MG tablet TAKE 1 TABLET BY MOUTH EVERY NIGHT 30 tablet 5  . VITAMIN D, CHOLECALCIFEROL, PO Take one by mouth daily    . Lactulose 20 GM/30ML SOLN 30 ml every 4 hours until constipation is relieved (Patient not taking: Reported on 01/21/2018) 236 mL 3   No facility-administered medications prior to visit.     Review of Systems;  Patient denies headache, fevers, malaise, unintentional weight loss, skin rash, eye pain,  sinus congestion and sinus pain, sore throat, dysphagia,  hemoptysis , cough, dyspnea, wheezing, chest pain, palpitations, orthopnea, edema, abdominal pain, nausea, melena, diarrhea, constipation, flank pain, dysuria, hematuria, urinary  Frequency, nocturia, numbness, tingling, seizures,  Focal weakness, Loss of consciousness,  Tremor, insomnia, depression, anxiety, and suicidal ideation.      Objective:  BP 128/86 (BP Location: Left Arm, Patient Position: Sitting, Cuff Size: Normal)   Pulse 92   Wt 122 lb 9.6 oz (55.6 kg)   SpO2 96%   BMI 21.04 kg/m   BP Readings from Last 3 Encounters:  01/21/18 128/86  05/28/17 124/86  05/05/17 128/88    Wt Readings from Last 3 Encounters:  01/21/18 122 lb 9.6 oz (55.6 kg)  05/28/17 122 lb 12.8 oz (55.7 kg)  05/05/17 123 lb (55.8 kg)    General appearance: alert, cooperative and appears stated age Neck: no adenopathy, no carotid bruit, supple, symmetrical, trachea midline and thyroid not enlarged, symmetric, no tenderness/mass/nodules Breasts:  Pendulous,  No masses,  Cellulitis or  Erythema  Lungs: clear to auscultation bilaterally Heart: regular rate and rhythm, S1, S2 normal, no murmur, click, rub or gallop Abdomen: soft, non-tender; bowel sounds normal; no masses,  no organomegaly Pulses: 2+ and symmetric Skin: Skin color, texture, turgor normal. No rashes or lesions Lymph nodes: Cervical, supraclavicular, and axillary nodes normal.  No  results found for: HGBA1C  Lab Results  Component Value Date   CREATININE 0.88 05/05/2017   CREATININE 0.91 11/14/2016   CREATININE 0.88 05/02/2016    Lab Results  Component Value Date   WBC 8.0 05/05/2017   HGB 12.9 05/05/2017   HCT 38.9 05/05/2017   PLT 281.0 05/05/2017   GLUCOSE 96 05/05/2017   CHOL 245 (H) 11/14/2016   TRIG 103.0 11/14/2016   HDL 85.10 11/14/2016   LDLDIRECT 122.6 04/23/2013   LDLCALC 139 (H) 11/14/2016   ALT 19 05/05/2017   AST 26 05/05/2017   NA 138 05/05/2017   K  3.9 05/05/2017   CL 104 05/05/2017   CREATININE 0.88 05/05/2017   BUN 16 05/05/2017   CO2 26 05/05/2017   TSH 2.71 05/05/2017    Mm Diag Breast Tomo Uni Left  Result Date: 07/10/2017 CLINICAL DATA:  Screening recall for possible left breast asymmetry. EXAM: 2D DIGITAL DIAGNOSTIC UNILATERAL LEFT MAMMOGRAM WITH CAD AND ADJUNCT TOMO COMPARISON:  Previous exam(s). ACR Breast Density Category c: The breast tissue is heterogeneously dense, which may obscure small masses. FINDINGS: Spot compression CC and MLO tomograms were performed of the left breast. The initially questioned left breast asymmetry resolves with the fibroglandular pattern unchanged in appearance dating back to prior mammograms from 2003. There is no mammographic evidence of malignancy in the left breast. Mammographic images were processed with CAD. IMPRESSION: No mammographic evidence of malignancy in the left breast. RECOMMENDATION: Screening mammogram in one year.(Code:[id number]) I have discussed the findings and recommendations with the patient. Results were also provided in writing at the conclusion of the visit. If applicable, a reminder letter will be sent to the patient regarding the next appointment. BI-RADS CATEGORY  1: Negative. Electronically Signed   By: Edwin Cap M.D.   On: 07/10/2017 13:38    Assessment & Plan:   Problem List Items Addressed This Visit    Breast pain    Suspect that her pain is a side effect from starting/stopping Evista, but given her concern for breast cancer,  Need to rule out mass.  diagnostic mammogram ordered        Other Visit Diagnoses    Acute breast pain    -  Primary   Relevant Orders   MM Digital Diagnostic Bilat   US BREAST LTD UNI RIGHT INC AXILLA   US BREAST COMPLETE UNI LEFT INC AXILLA    A total of 25 minutes of face to face time was spent with patient more than half of which was spent in counselling about the above mentioned conditions  and coordination of care   I am  having Taylor Bass start on furosemide. I am also having her maintain her (VITAMIN D, CHOLECALCIFEROL, PO), Calcium-Vitamin D-Vitamin K (VIACTIV PO), fluticasone, raloxifene, Lactulose, cholestyramine, rosuvastatin, and omeprazole.  Meds ordered this encounter  Medications  . furosemide (LASIX) 20 MG tablet    Sig: Take 1 tablet (20 mg total) by mouth daily. As needed for fluid retention    Dispense:  30 tablet    Refill:  0    There are no discontinued medications.  Follow-up: No follow-ups on file.   [name], MD

## 2018-01-22 DIAGNOSIS — N644 Mastodynia: Secondary | ICD-10-CM | POA: Insufficient documentation

## 2018-01-22 NOTE — Assessment & Plan Note (Signed)
Suspect that her pain is a side effect from starting/stopping Evista, but given her concern for breast cancer,  Need to rule out mass.  diagnostic mammogram ordered

## 2018-01-23 ENCOUNTER — Ambulatory Visit: Payer: Commercial Managed Care - HMO | Admitting: Internal Medicine

## 2018-01-23 ENCOUNTER — Encounter (INDEPENDENT_AMBULATORY_CARE_PROVIDER_SITE_OTHER): Payer: Self-pay

## 2018-01-23 ENCOUNTER — Other Ambulatory Visit: Payer: Self-pay | Admitting: Internal Medicine

## 2018-01-23 DIAGNOSIS — N644 Mastodynia: Secondary | ICD-10-CM

## 2018-01-23 DIAGNOSIS — R52 Pain, unspecified: Principal | ICD-10-CM

## 2018-01-23 NOTE — Progress Notes (Signed)
Orders placed.

## 2018-02-02 ENCOUNTER — Ambulatory Visit
Admission: RE | Admit: 2018-02-02 | Discharge: 2018-02-02 | Disposition: A | Discharge status: Home / Self Care | Payer: Medicare Other | Source: Ambulatory Visit | Attending: Internal Medicine | Admitting: Internal Medicine

## 2018-02-02 DIAGNOSIS — R52 Pain, unspecified: Principal | ICD-10-CM

## 2018-02-02 DIAGNOSIS — N644 Mastodynia: Secondary | ICD-10-CM

## 2018-02-02 DIAGNOSIS — R922 Inconclusive mammogram: Secondary | ICD-10-CM | POA: Diagnosis not present

## 2018-02-02 IMAGING — CT CT HEART SCORING
2 series · 16 of 20 positions shown, 18 images · non-contrast
Comparison: Chest CT 04/24/2012.

CLINICAL DATA: Risk stratification

EXAM:
Coronary Calcium Score
TECHNIQUE: The patient was scanned on a Siemens Sensation 16 slice scanner.
Axial non-contrast 3mm slices were carried out through the heart.
The data set was analyzed on a dedicated work station and scored
using the Agatson method.

[Series 2: casc 3.0 i36f 2 bestdiast 66 % · axial · 0.25mm/px · z∈[-252,-141]mm · 8 of 49 slices shown, 10 images]
[im 6/49  vessel]
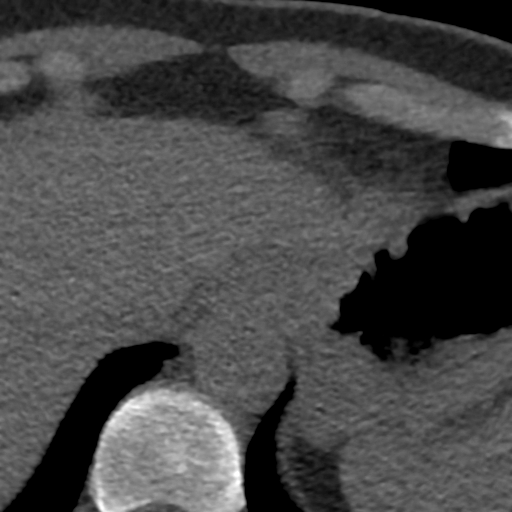
[im 6/49  lung]
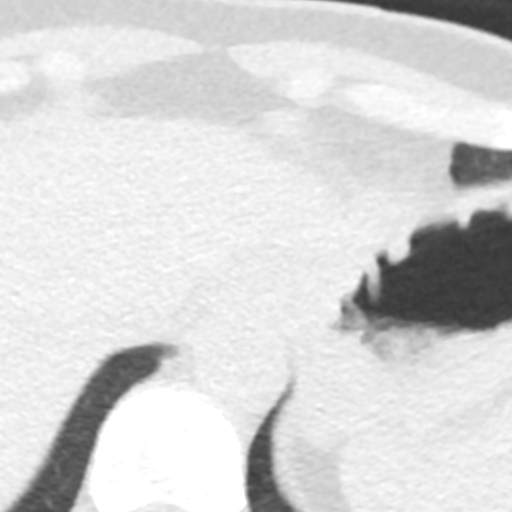
[im 11/49  vessel]
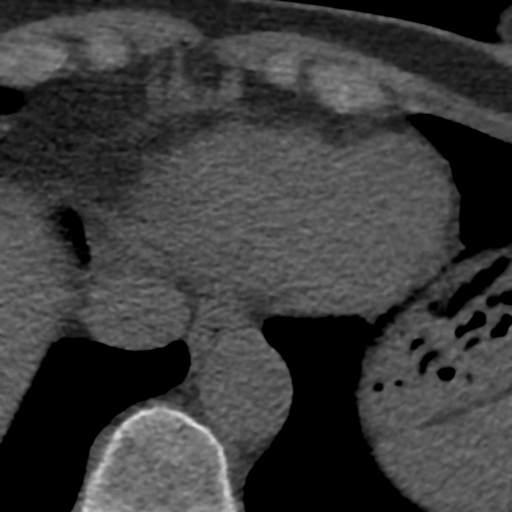
[im 17/49  vessel]
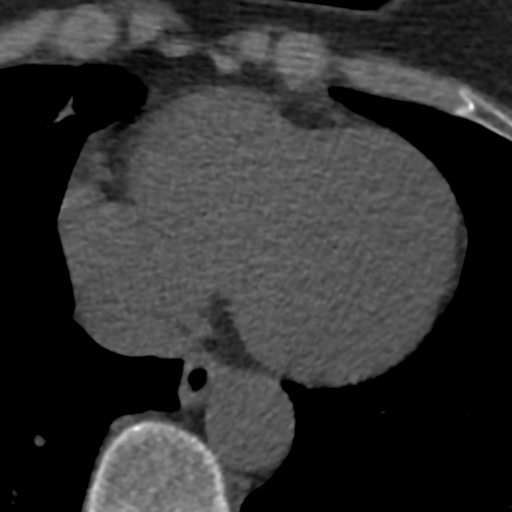
[im 22/49  vessel]
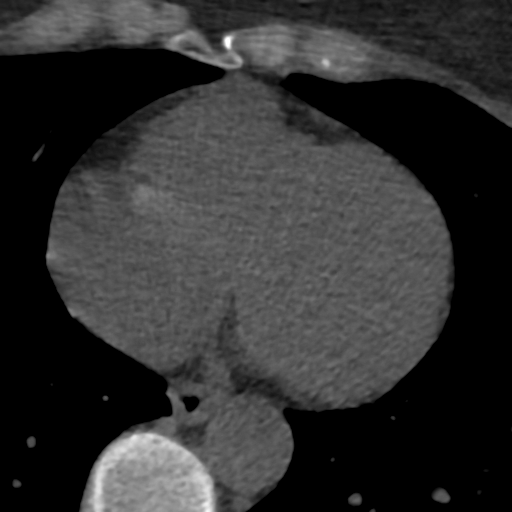
[im 27/49  vessel]
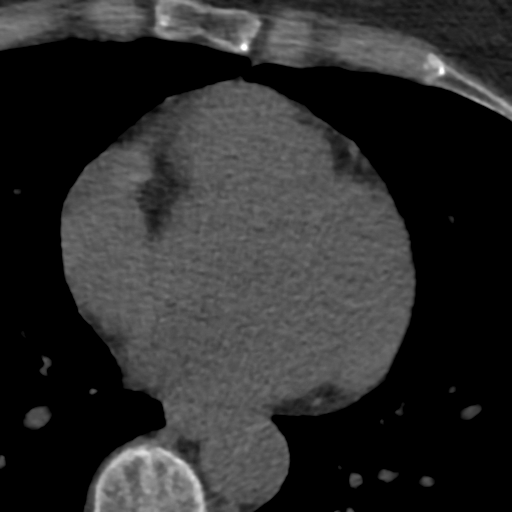
[im 27/49  lung]
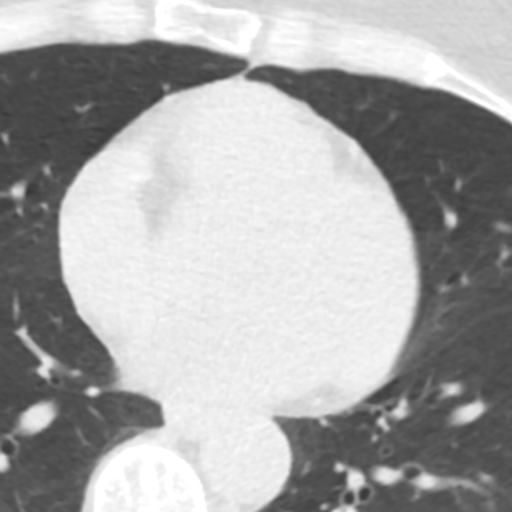
[im 33/49  vessel]
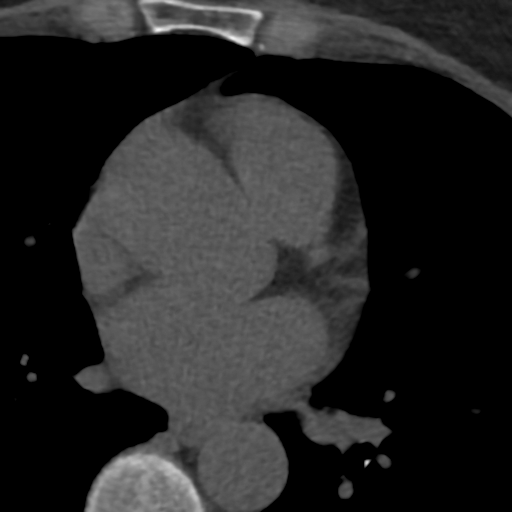
[im 38/49  vessel]
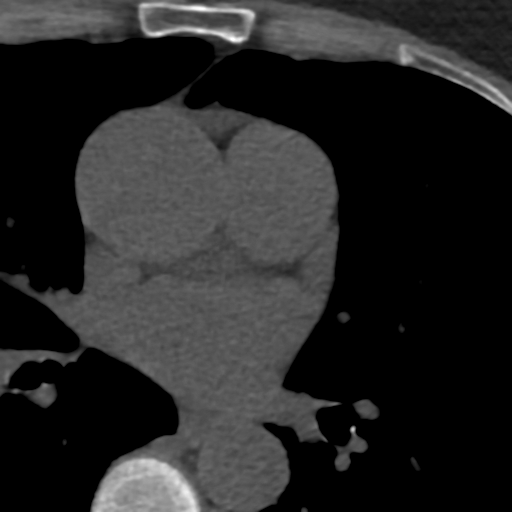
[im 43/49  vessel]
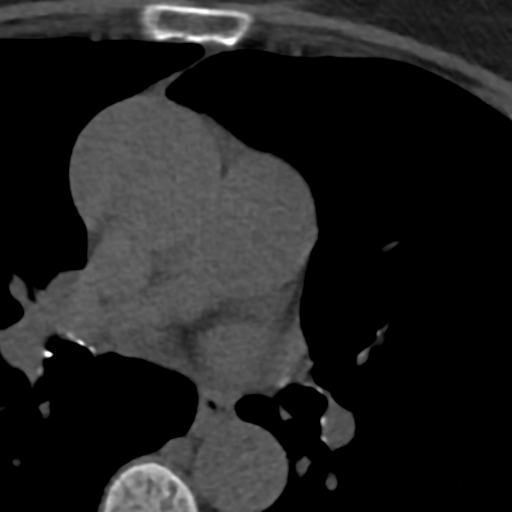

[Series 4: lung st 66 % · axial · 0.60mm/px · z∈[-252,-141]mm · 8 of 49 slices shown]
[im 6/49  lung]
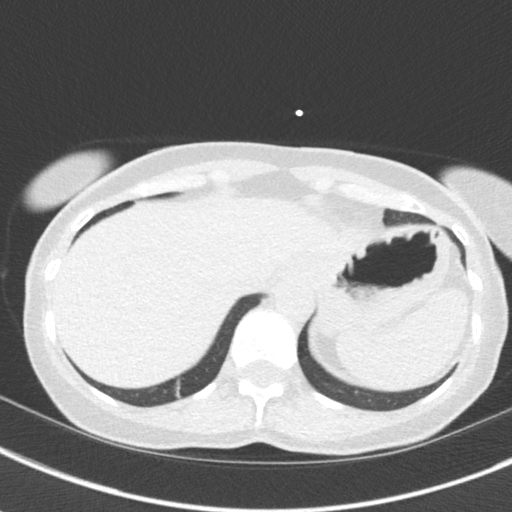
[im 11/49  lung]
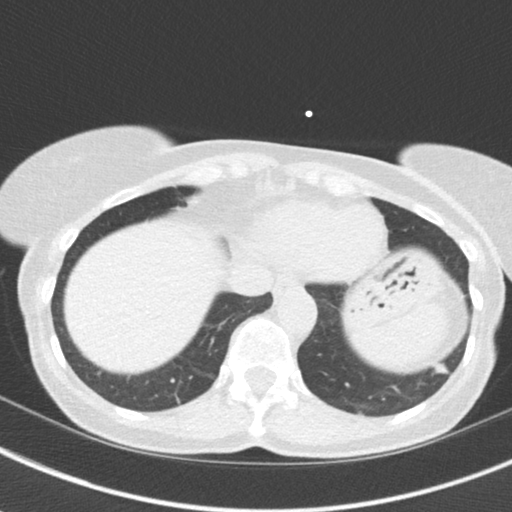
[im 17/49  lung]
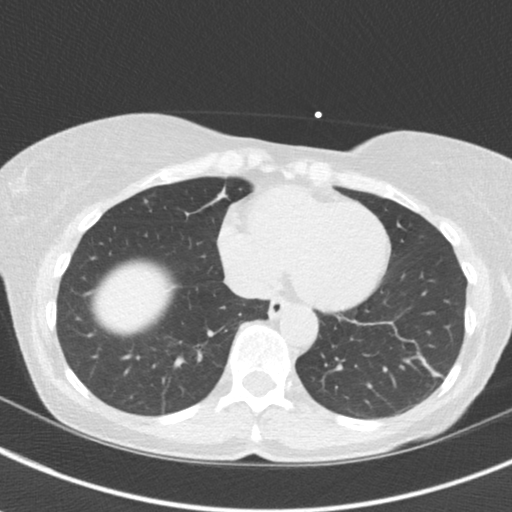
[im 22/49  lung]
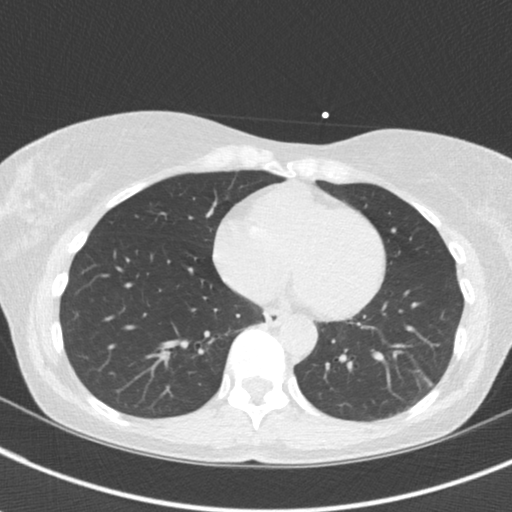
[im 27/49  lung]
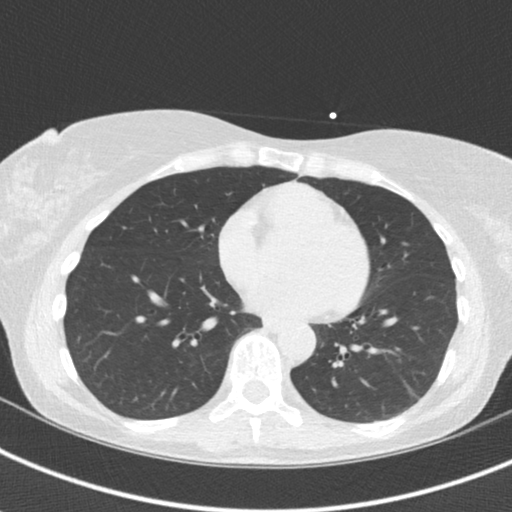
[im 33/49  lung]
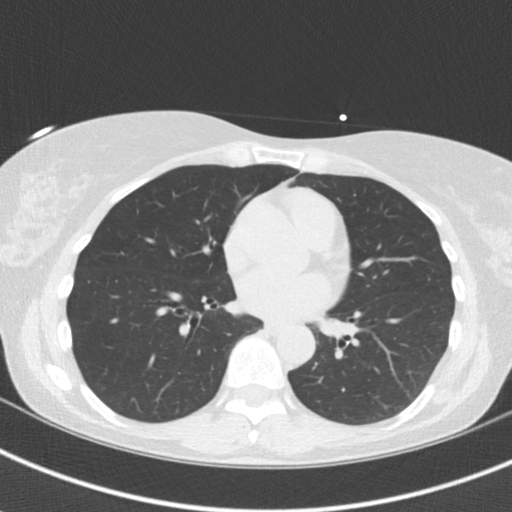
[im 38/49  lung]
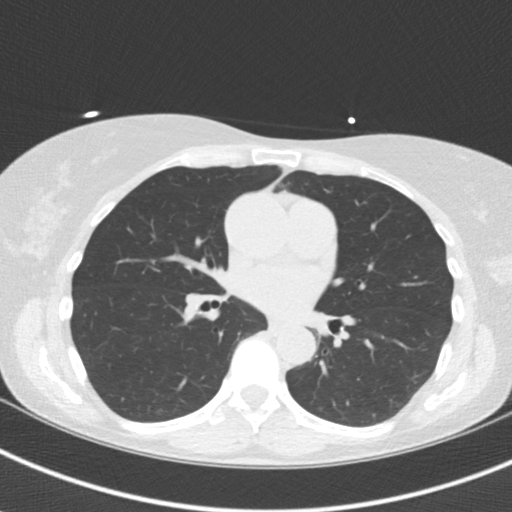
[im 43/49  lung]
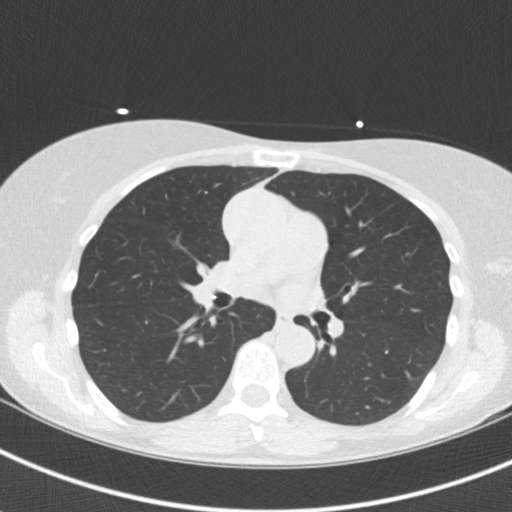

[16 of 20 positions shown; findings below may reference images not displayed]

FINDINGS: Non-cardiac: Lung nodule See separate report from [REDACTED].

Ascending Aorta:  3.9 cm

Pericardium: Normal

Coronary arteries:  No calcium detected
IMPRESSION: Coronary calcium score of 0.

Mild aortic root dilatation f/u MRI/MRA in a year

Lingura Tod

EXAM:
OVER-READ INTERPRETATION  CT CHEST

The following report is an over-read performed by radiologist Dr.
over-read does not include interpretation of cardiac or coronary
anatomy or pathology. The coronary calcium score interpretation by
the cardiologist is attached.
FINDINGS: Mild ectasia of ascending thoracic aorta (4.0 cm in diameter). 3 mm
nodule in the right middle lobe (image 33 of series 3). Tiny
calcified granuloma in the left lower lobe incidentally noted. No
larger more suspicious appearing pulmonary nodules or masses are
otherwise identified in the visualized portions of the thorax.
Linear areas of scarring are noted throughout the lung bases
bilaterally. No acute consolidative airspace disease, no pleural
effusions, no pneumothorax and no lymphadenopathy noted in the
visualized thorax. Visualized portions of the upper abdomen are
unremarkable. There are no aggressive appearing lytic or blastic
lesions noted in the visualized portions of the skeleton.
IMPRESSION: 1. 3 mm nodule in the right middle lobe. This is highly nonspecific
and statistically likely benign. No follow-up needed if patient is
low-risk. Non-contrast chest CT can be considered in 12 months if
patient is high-risk. This recommendation follows the consensus
statement: Guidelines for Management of Incidental Pulmonary Nodules
Detected on CT Images:From the [HOSPITAL] 1359; published
online before print (10.1148/radiol.7141414101).
2. Mild ectasia of the ascending thoracic aorta (4.0 cm in
diameter), increased from 3.6 cm on 04/24/2012. Recommend annual
imaging followup by CTA or MRA. This recommendation follows 1909
ACCF/AHA/AATS/ACR/ASA/SCA/ROMONA/GREFG/AITANA/GERENA Guidelines for the
Diagnosis and Management of Patients with Thoracic Aortic Disease.
Circulation. 1909; 121: e266-e369.

## 2018-02-17 ENCOUNTER — Other Ambulatory Visit: Payer: Self-pay | Admitting: Internal Medicine

## 2018-03-31 ENCOUNTER — Other Ambulatory Visit: Payer: Self-pay | Admitting: Internal Medicine

## 2018-04-08 ENCOUNTER — Other Ambulatory Visit: Payer: Self-pay | Admitting: Internal Medicine

## 2018-06-03 ENCOUNTER — Ambulatory Visit (INDEPENDENT_AMBULATORY_CARE_PROVIDER_SITE_OTHER): Payer: BLUE CROSS/BLUE SHIELD | Admitting: Internal Medicine

## 2018-06-03 ENCOUNTER — Encounter: Payer: Self-pay | Admitting: Internal Medicine

## 2018-06-03 VITALS — BP 122/68 | HR 85 | Temp 98.3°F | Resp 14 | Ht 64.0 in | Wt 121.8 lb

## 2018-06-03 DIAGNOSIS — E785 Hyperlipidemia, unspecified: Secondary | ICD-10-CM | POA: Diagnosis not present

## 2018-06-03 DIAGNOSIS — Z1231 Encounter for screening mammogram for malignant neoplasm of breast: Secondary | ICD-10-CM | POA: Diagnosis not present

## 2018-06-03 DIAGNOSIS — I77810 Thoracic aortic ectasia: Secondary | ICD-10-CM | POA: Diagnosis not present

## 2018-06-03 DIAGNOSIS — Z0000 Encounter for general adult medical examination without abnormal findings: Secondary | ICD-10-CM

## 2018-06-03 DIAGNOSIS — Z79899 Other long term (current) drug therapy: Secondary | ICD-10-CM

## 2018-06-03 DIAGNOSIS — N644 Mastodynia: Secondary | ICD-10-CM | POA: Diagnosis not present

## 2018-06-03 DIAGNOSIS — Z1239 Encounter for other screening for malignant neoplasm of breast: Secondary | ICD-10-CM

## 2018-06-03 DIAGNOSIS — Z23 Encounter for immunization: Secondary | ICD-10-CM

## 2018-06-03 DIAGNOSIS — E559 Vitamin D deficiency, unspecified: Secondary | ICD-10-CM

## 2018-06-03 DIAGNOSIS — M816 Localized osteoporosis [Lequesne]: Secondary | ICD-10-CM | POA: Diagnosis not present

## 2018-06-03 LAB — COMPREHENSIVE METABOLIC PANEL
ALT: 17 U/L (ref 0–35)
AST: 24 U/L (ref 0–37)
Albumin: 4.4 g/dL (ref 3.5–5.2)
Alkaline Phosphatase: 75 U/L (ref 39–117)
BUN: 15 mg/dL (ref 6–23)
CO2: 30 mEq/L (ref 19–32)
Calcium: 10.4 mg/dL (ref 8.4–10.5)
Chloride: 103 mEq/L (ref 96–112)
Creatinine, Ser: 0.94 mg/dL (ref 0.40–1.20)
GFR: 63.1 mL/min (ref >60.00)
Glucose, Bld: 92 mg/dL (ref 70–99)
Potassium: 4.6 mEq/L (ref 3.5–5.1)
Sodium: 140 mEq/L (ref 135–145)
Total Bilirubin: 0.7 mg/dL (ref 0.2–1.2)
Total Protein: 7.5 g/dL (ref 6.0–8.3)

## 2018-06-03 LAB — LIPID PANEL
Cholesterol: 254 mg/dL — ABNORMAL HIGH (ref 0–200)
HDL: 82.1 mg/dL (ref >39.00)
LDL Cholesterol: 145 mg/dL — ABNORMAL HIGH (ref 0–99)
NonHDL: 171.67
Total CHOL/HDL Ratio: 3
Triglycerides: 131 mg/dL (ref 0.0–149.0)
VLDL: 26.2 mg/dL (ref 0.0–40.0)

## 2018-06-03 LAB — TSH: TSH: 2.8 u[IU]/mL (ref 0.35–4.50)

## 2018-06-03 LAB — VITAMIN D 25 HYDROXY (VIT D DEFICIENCY, FRACTURES): VITD: 30.1 ng/mL (ref 30.00–100.00)

## 2018-06-03 MED ORDER — RALOXIFENE HCL 60 MG PO TABS: 60.0000 mg | ORAL_TABLET | Freq: Every day | ORAL | 2 refills | Status: DC

## 2018-06-03 MED ORDER — ZOSTER VAC RECOMB ADJUVANTED 50 MCG/0.5ML IM SUSR: 0.5000 mL | Freq: Once | INTRAMUSCULAR | 1 refills | Status: AC

## 2018-06-03 NOTE — Patient Instructions (Addendum)
You are due for an annual imaging of your aortic root.  I will order it.   The ShingRx vaccine is now available in local pharmacies and is much more protective thant Zostavaxs,  It is therefore ADVISED for all interested adults over 67 to prevent shingles    Health Maintenance for Postmenopausal Women Menopause is a normal process in which your reproductive ability comes to an end. This process happens gradually over a span of months to years, usually between the ages of 33 and 61. Menopause is complete when you have missed 12 consecutive menstrual periods. It is important to talk with your health care provider about some of the most common conditions that affect postmenopausal women, such as heart disease, cancer, and bone loss (osteoporosis). Adopting a healthy lifestyle and getting preventive care can help to promote your health and wellness. Those actions can also lower your chances of developing some of these common conditions. What should I know about menopause? During menopause, you may experience a number of symptoms, such as:  Moderate-to-severe hot flashes.  Night sweats.  Decrease in sex drive.  Mood swings.  Headaches.  Tiredness.  Irritability.  Memory problems.  Insomnia.  Choosing to treat or not to treat menopausal changes is an individual decision that you make with your health care provider. What should I know about hormone replacement therapy and supplements? Hormone therapy products are effective for treating symptoms that are associated with menopause, such as hot flashes and night sweats. Hormone replacement carries certain risks, especially as you become older. If you are thinking about using estrogen or estrogen with progestin treatments, discuss the benefits and risks with your health care provider. What should I know about heart disease and stroke? Heart disease, heart attack, and stroke become more likely as you age. This may be due, in part, to the  hormonal changes that your body experiences during menopause. These can affect how your body processes dietary fats, triglycerides, and cholesterol. Heart attack and stroke are both medical emergencies. There are many things that you can do to help prevent heart disease and stroke:  Have your blood pressure checked at least every 1-2 years. High blood pressure causes heart disease and increases the risk of stroke.  If you are 67-75 years old, ask your health care provider if you should take aspirin to prevent a heart attack or a stroke.  Do not use any tobacco products, including cigarettes, chewing tobacco, or electronic cigarettes. If you need help quitting, ask your health care provider.  It is important to eat a healthy diet and maintain a healthy weight. ? Be sure to include plenty of vegetables, fruits, low-fat dairy products, and lean protein. ? Avoid eating foods that are high in solid fats, added sugars, or salt (sodium).  Get regular exercise. This is one of the most important things that you can do for your health. ? Try to exercise for at least 150 minutes each week. The type of exercise that you do should increase your heart rate and make you sweat. This is known as moderate-intensity exercise. ? Try to do strengthening exercises at least twice each week. Do these in addition to the moderate-intensity exercise.  Know your numbers.Ask your health care provider to check your cholesterol and your blood glucose. Continue to have your blood tested as directed by your health care provider.  What should I know about cancer screening? There are several types of cancer. Take the following steps to reduce your risk and to  catch any cancer development as early as possible. Breast Cancer  Practice breast self-awareness. ? This means understanding how your breasts normally appear and feel. ? It also means doing regular breast self-exams. Let your health care provider know about any changes,  no matter how small.  If you are 67 or older, have a clinician do a breast exam (clinical breast exam or CBE) every year. Depending on your age, family history, and medical history, it may be recommended that you also have a yearly breast X-ray (mammogram).  If you have a family history of breast cancer, talk with your health care provider about genetic screening.  If you are at high risk for breast cancer, talk with your health care provider about having an MRI and a mammogram every year.  Breast cancer (BRCA) gene test is recommended for women who have family members with BRCA-related cancers. Results of the assessment will determine the need for genetic counseling and BRCA1 and for BRCA2 testing. BRCA-related cancers include these types: ? Breast. This occurs in males or females. ? Ovarian. ? Tubal. This may also be called fallopian tube cancer. ? Cancer of the abdominal or pelvic lining (peritoneal cancer). ? Prostate. ? Pancreatic.  Cervical, Uterine, and Ovarian Cancer Your health care provider may recommend that you be screened regularly for cancer of the pelvic organs. These include your ovaries, uterus, and vagina. This screening involves a pelvic exam, which includes checking for microscopic changes to the surface of your cervix (Pap test).  For women ages 21-65, health care providers may recommend a pelvic exam and a Pap test every three years. For women ages 67-65, they may recommend the Pap test and pelvic exam, combined with testing for human papilloma virus (HPV), every five years. Some types of HPV increase your risk of cervical cancer. Testing for HPV may also be done on women of any age who have unclear Pap test results.  Other health care providers may not recommend any screening for nonpregnant women who are considered low risk for pelvic cancer and have no symptoms. Ask your health care provider if a screening pelvic exam is right for you.  If you have had past treatment  for cervical cancer or a condition that could lead to cancer, you need Pap tests and screening for cancer for at least 20 years after your treatment. If Pap tests have been discontinued for you, your risk factors (such as having a new sexual partner) need to be reassessed to determine if you should start having screenings again. Some women have medical problems that increase the chance of getting cervical cancer. In these cases, your health care provider may recommend that you have screening and Pap tests more often.  If you have a family history of uterine cancer or ovarian cancer, talk with your health care provider about genetic screening.  If you have vaginal bleeding after reaching menopause, tell your health care provider.  There are currently no reliable tests available to screen for ovarian cancer.  Lung Cancer Lung cancer screening is recommended for adults 21-62 years old who are at high risk for lung cancer because of a history of smoking. A yearly low-dose CT scan of the lungs is recommended if you:  Currently smoke.  Have a history of at least 30 pack-years of smoking and you currently smoke or have quit within the past 15 years. A pack-year is smoking an average of one pack of cigarettes per day for one year.  Yearly screening should:  Continue until it has been 15 years since you quit.  Stop if you develop a health problem that would prevent you from having lung cancer treatment.  Colorectal Cancer  This type of cancer can be detected and can often be prevented.  Routine colorectal cancer screening usually begins at age 26 and continues through age 90.  If you have risk factors for colon cancer, your health care provider may recommend that you be screened at an earlier age.  If you have a family history of colorectal cancer, talk with your health care provider about genetic screening.  Your health care provider may also recommend using home test kits to check for hidden  blood in your stool.  A small camera at the end of a tube can be used to examine your colon directly (sigmoidoscopy or colonoscopy). This is done to check for the earliest forms of colorectal cancer.  Direct examination of the colon should be repeated every 5-10 years until age 1. However, if early forms of precancerous polyps or small growths are found or if you have a family history or genetic risk for colorectal cancer, you may need to be screened more often.  Skin Cancer  Check your skin from head to toe regularly.  Monitor any moles. Be sure to tell your health care provider: ? About any new moles or changes in moles, especially if there is a change in a mole's shape or color. ? If you have a mole that is larger than the size of a pencil eraser.  If any of your family members has a history of skin cancer, especially at a young age, talk with your health care provider about genetic screening.  Always use sunscreen. Apply sunscreen liberally and repeatedly throughout the day.  Whenever you are outside, protect yourself by wearing long sleeves, pants, a wide-brimmed hat, and sunglasses.  What should I know about osteoporosis? Osteoporosis is a condition in which bone destruction happens more quickly than new bone creation. After menopause, you may be at an increased risk for osteoporosis. To help prevent osteoporosis or the bone fractures that can happen because of osteoporosis, the following is recommended:  If you are 56-28 years old, get at least 1,000 mg of calcium and at least 600 mg of vitamin D per day.  If you are older than age 91 but younger than age 53, get at least 1,200 mg of calcium and at least 600 mg of vitamin D per day.  If you are older than age 54, get at least 1,200 mg of calcium and at least 800 mg of vitamin D per day.  Smoking and excessive alcohol intake increase the risk of osteoporosis. Eat foods that are rich in calcium and vitamin D, and do weight-bearing  exercises several times each week as directed by your health care provider. What should I know about how menopause affects my mental health? Depression may occur at any age, but it is more common as you become older. Common symptoms of depression include:  Low or sad mood.  Changes in sleep patterns.  Changes in appetite or eating patterns.  Feeling an overall lack of motivation or enjoyment of activities that you previously enjoyed.  Frequent crying spells.  Talk with your health care provider if you think that you are experiencing depression. What should I know about immunizations? It is important that you get and maintain your immunizations. These include:  Tetanus, diphtheria, and pertussis (Tdap) booster vaccine.  Influenza every year before the  flu season begins.  Pneumonia vaccine.  Shingles vaccine.  Your health care provider may also recommend other immunizations. This information is not intended to replace advice given to you by your health care provider. Make sure you discuss any questions you have with your health care provider. Document Released: 11/22/2005 Document Revised: 04/19/2016 Document Reviewed: 07/04/2015 Elsevier Interactive Patient Education  2018 [employer].

## 2018-06-03 NOTE — Progress Notes (Signed)
comPatient ID: Taylor [name] [name], female    DOB: 1951-06-05  Age: 67 y.o. MRN: [id number]  The patient is here for annual preventive  examination and management of other chronic and acute problems.  Diagnostic mammogram in April ; normal  Not due for a year   The risk factors are reflected in the social history.  The roster of all physicians providing medical care to patient - is listed in the Snapshot section of the chart.  Activities of daily living:  The patient is 100% independent in all ADLs: dressing, toileting, feeding as well as independent mobility  Home safety : The patient has smoke detectors in the home. They wear seatbelts.  There are no firearms at home. There is no violence in the home.   There is no risks for hepatitis, STDs or HIV. There is no   history of blood transfusion. They have no travel history to infectious disease endemic areas of the world.  The patient has seen their dentist in the last six month. They have seen their eye doctor in the last year. They admit to slight hearing difficulty with regard to whispered voices and some television programs.  They have deferred audiologic testing in the last year.  They do not  have excessive sun exposure. Discussed the need for sun protection: hats, long sleeves and use of sunscreen if there is significant sun exposure.   Diet: the importance of a healthy diet is discussed. They do have a healthy diet.  The benefits of regular aerobic exercise were discussed. She walks 4 times per week ,  20 minutes.   Depression screen: there are no signs or vegative symptoms of depression- irritability, change in appetite, anhedonia, sadness/tearfullness.  Cognitive assessment: the patient manages all their financial and personal affairs and is actively engaged. They could relate day,date,year and events; recalled 2/3 objects at 3 minutes; performed clock-face test normally.  The following portions of the patient's history were reviewed and  updated as appropriate: allergies, current medications, past family history, past medical history,  past surgical history, past social history  and problem list.  Visual acuity was not assessed per patient preference since she has regular follow up with her ophthalmologist. Hearing and body mass index were assessed and reviewed.   During the course of the visit the patient was educated and counseled about appropriate screening and preventive services including : fall prevention , diabetes screening, nutrition counseling, colorectal cancer screening, and recommended immunizations.    CC: The primary encounter diagnosis was Breast cancer screening. Diagnoses of Localized osteoporosis without current pathological fracture, Hyperlipidemia LDL goal <160, Vitamin D deficiency, Aortic root dilatation ([place]), Need for 23-polyvalent pneumococcal polysaccharide vaccine, Long-term current use of high risk medication other than anticoagulant, Breast pain, and Visit for preventive health examination were also pertinent to this visit.   Aortic root dilatation noted last year  CT angio  Done .  Annual repeat discussed   Hyperlipidemia:  Taking crestor.  Coronary calcium score was zero.   Mother passed 3 weeks ago  At the age of 62  grieving but not depressed  Weight stable  Osteoporosis > Last DEXA prior to 2017.  Prior trials of alendronate,  Now on Evista     [name] [name] has a past medical history of Chest pain, Fever blister, Hyperlipidemia, Irritable bowel syndrome, Mitral valve prolapse, Normal cardiac stress test (2007), and Osteoporosis.   She has a past surgical history that includes Cholecystectomy (Approx - 2002).   Her family  history includes Aortic aneurysm in her paternal grandfather; Heart disease (age of onset: 70) in her father; Heart disease (age of onset: 6) in her brother; Multiple sclerosis in her mother.She reports that she has never smoked. She has never used smokeless tobacco. She  reports that she drinks about 3.0 standard drinks of alcohol per week. She reports that she does not use drugs.  Outpatient Medications Prior to Visit  Medication Sig Dispense Refill  . Calcium-Vitamin D-Vitamin K (VIACTIV PO) Take as directed    . cholestyramine (QUESTRAN) 4 GM/DOSE powder USE AS DIRECTED PER PACKAGE INSTRUCTIONS. 1 SCOOPFUL 3 TIMES DAILY WITH MEALS 378 g 3  . fluticasone (FLONASE) 50 MCG/ACT nasal spray USE 2 SPRAYS IN EACH NOSTRIL EVERY DAY 16 g 2  . omeprazole (PRILOSEC) 40 MG capsule TAKE 1 CAPSULE BY MOUTH DAILY BEFORE BREAKFAST 30 capsule 3  . rosuvastatin (CRESTOR) 10 MG tablet TAKE 1 TABLET BY MOUTH EVERY NIGHT (Patient taking differently: Take 10 mg by mouth daily. ) 30 tablet 5  . VITAMIN D, CHOLECALCIFEROL, PO Take one by mouth daily    . furosemide (LASIX) 20 MG tablet TAKE 1 TABLET BY MOUTH DAILY AS NEEDED FOR FLUID RETENTION (Patient not taking: Reported on 06/03/2018) 90 tablet 1  . raloxifene (EVISTA) 60 MG tablet Take 1 tablet (60 mg total) by mouth daily. (Patient not taking: Reported on 06/03/2018) 30 tablet 5  . Lactulose 20 GM/30ML SOLN 30 ml every 4 hours until constipation is relieved (Patient not taking: Reported on 01/21/2018) 236 mL 3   No facility-administered medications prior to visit.     Review of Systems   Patient denies headache, fevers, malaise, unintentional weight loss, skin rash, eye pain, sinus congestion and sinus pain, sore throat, dysphagia,  hemoptysis , cough, dyspnea, wheezing, chest pain, palpitations, orthopnea, edema, abdominal pain, nausea, melena, diarrhea, constipation, flank pain, dysuria, hematuria, urinary  Frequency, nocturia, numbness, tingling, seizures,  Focal weakness, Loss of consciousness,  Tremor, insomnia, depression, anxiety, and suicidal ideation.      Objective:  BP 122/68 (BP Location: Left Arm, Patient Position: Sitting, Cuff Size: Normal)   Pulse 85   Temp 98.3 F (36.8 C) (Oral)   Resp 14   Ht 5\' 4"   (1.626 m)   Wt 121 lb 12.8 oz (55.2 kg)   SpO2 98%   BMI 20.91 kg/m   Physical Exam   General appearance: alert, cooperative and appears stated age Head: Normocephalic, without obvious abnormality, atraumatic Eyes: conjunctivae/corneas clear. PERRL, EOM's intact. Fundi benign. Ears: normal TM's and external ear canals both ears Nose: Nares normal. Septum midline. Mucosa normal. No drainage or sinus tenderness. Throat: lips, mucosa, and tongue normal; teeth and gums normal Neck: no adenopathy, no carotid bruit, no JVD, supple, symmetrical, trachea midline and thyroid not enlarged, symmetric, no tenderness/mass/nodules Lungs: clear to auscultation bilaterally Breasts: normal appearance, no masses or tenderness Heart: regular rate and rhythm, S1, S2 normal, no murmur, click, rub or gallop Abdomen: soft, non-tender; bowel sounds normal; no masses,  no organomegaly Extremities: extremities normal, atraumatic, no cyanosis or edema Pulses: 2+ and symmetric Skin: Skin color, texture, turgor normal. No rashes or lesions Neurologic: Alert and oriented X 3, normal strength and tone. Normal symmetric reflexes. Normal coordination and gait.     Assessment & Plan:   Problem List Items Addressed This Visit    Osteoporosis    Managed with Evista.  Last DEXA 2014 pre review of [facility] records.  Repeat due this year  Relevant Medications   raloxifene (EVISTA) 60 MG tablet   Other Relevant Orders   DG Bone Density   TSH (Completed)   Visit for preventive health examination    Annual comprehensive preventive exam was done as well as an evaluation and management of chronic conditions .  During the course of the visit the patient was educated and counseled about appropriate screening and preventive services including :  diabetes screening, lipid analysis with projected  10 year  risk for CAD , nutrition counseling, breast, cervical and colorectal cancer screening, and recommended  immunizations.  Printed recommendations for health maintenance screenings was given      Hyperlipidemia LDL goal <160    LDL and triglycerides are at goal on current medications. shehas no side effects and liver enzymes are normal. No changes today   Lab Results  Component Value Date   CHOL 254 (H) 06/03/2018   HDL 82.10 06/03/2018   LDLCALC 145 (H) 06/03/2018   LDLDIRECT 122.6 04/23/2013   TRIG 131.0 06/03/2018   CHOLHDL 3 06/03/2018   Lab Results  Component Value Date   ALT 17 06/03/2018   AST 24 06/03/2018   ALKPHOS 75 06/03/2018   BILITOT 0.7 06/03/2018         Relevant Orders   Comprehensive metabolic panel (Completed)   Lipid panel (Completed)   TSH (Completed)   Aortic root dilatation ([facility])    Follow up CT done in 2018 noted no  change .  Annual follow up CT angio has been ordered.        Relevant Orders   CT Angio Chest W/Cm &/Or Wo Cm   Breast pain    Diagnostic mammogram was normal in April..  One year follow up advised  she has resumed Evista       Other Visit Diagnoses    Breast cancer screening    -  Primary   Relevant Orders   MM 3D SCREEN BREAST BILATERAL   Vitamin D deficiency       Relevant Orders   VITAMIN D 25 Hydroxy (Vit-D Deficiency, Fractures) (Completed)   Need for 23-polyvalent pneumococcal polysaccharide vaccine       Relevant Orders   Pneumococcal polysaccharide vaccine 23-valent greater than or equal to 2yo subcutaneous/IM (Completed)   Long-term current use of high risk medication other than anticoagulant       Relevant Orders   Comprehensive metabolic panel      I have discontinued Taylor [name] [name]'s Lactulose. I am also having her start on raloxifene and Zoster Vaccine Adjuvanted. Additionally, I am having her maintain her (VITAMIN D, CHOLECALCIFEROL, PO), Calcium-Vitamin D-Vitamin K (VIACTIV PO), fluticasone, raloxifene, rosuvastatin, furosemide, omeprazole, and cholestyramine.  Meds ordered this encounter  Medications  .  raloxifene (EVISTA) 60 MG tablet    Sig: Take 1 tablet (60 mg total) by mouth daily.    Dispense:  30 tablet    Refill:  2  . Zoster Vaccine Adjuvanted [facility]) injection    Sig: Inject 0.5 mLs into the muscle once for 1 dose.    Dispense:  1 each    Refill:  1    Medications Discontinued During This Encounter  Medication Reason  . Lactulose 20 GM/30ML SOLN Patient Preference    Follow-up: Return in about 6 months (around 12/04/2018).   Crecencio Mc, MD

## 2018-06-06 NOTE — Assessment & Plan Note (Signed)
Diagnostic mammogram was normal in April..  One year follow up advised  she has resumed Evista

## 2018-06-06 NOTE — Assessment & Plan Note (Addendum)
Annual comprehensive preventive exam was done as well as an evaluation and management of chronic conditions .  During the course of the visit the patient was educated and counseled about appropriate screening and preventive services including :  diabetes screening, lipid analysis with projected  10 year  risk for CAD , nutrition counseling, breast, cervical and colorectal cancer screening, and recommended immunizations.  Printed recommendations for health maintenance screenings was given 

## 2018-06-06 NOTE — Assessment & Plan Note (Signed)
Follow up CT done in 2018 noted no  change .  Annual follow up CT angio has been ordered.

## 2018-06-06 NOTE — Assessment & Plan Note (Signed)
LDL and triglycerides are at goal on current medications. shehas no side effects and liver enzymes are normal. No changes today   Lab Results  Component Value Date   CHOL 254 (H) 06/03/2018   HDL 82.10 06/03/2018   LDLCALC 145 (H) 06/03/2018   LDLDIRECT 122.6 04/23/2013   TRIG 131.0 06/03/2018   CHOLHDL 3 06/03/2018   Lab Results  Component Value Date   ALT 17 06/03/2018   AST 24 06/03/2018   ALKPHOS 75 06/03/2018   BILITOT 0.7 06/03/2018

## 2018-06-06 NOTE — Assessment & Plan Note (Signed)
Managed with Evista.  Last DEXA 2014 pre review of [facility] records.  Repeat due this year

## 2018-06-11 ENCOUNTER — Ambulatory Visit
Admission: RE | Admit: 2018-06-11 | Discharge: 2018-06-11 | Disposition: A | Discharge status: Home / Self Care | Payer: BLUE CROSS/BLUE SHIELD | Source: Ambulatory Visit | Attending: Internal Medicine | Admitting: Internal Medicine

## 2018-06-11 ENCOUNTER — Other Ambulatory Visit: Payer: Self-pay | Admitting: Internal Medicine

## 2018-06-11 DIAGNOSIS — I77810 Thoracic aortic ectasia: Secondary | ICD-10-CM | POA: Diagnosis not present

## 2018-06-11 DIAGNOSIS — I712 Thoracic aortic aneurysm, without rupture: Secondary | ICD-10-CM | POA: Diagnosis not present

## 2018-06-11 DIAGNOSIS — Q2549 Other congenital malformations of aorta: Secondary | ICD-10-CM | POA: Diagnosis not present

## 2018-06-11 MED ORDER — IOPAMIDOL (ISOVUE-370) INJECTION 76%
75.0000 mL | Freq: Once | INTRAVENOUS | Status: AC | PRN
  Administered 2018-06-11: 75 mL via INTRAVENOUS

## 2018-07-10 ENCOUNTER — Encounter: Payer: Self-pay | Admitting: Internal Medicine

## 2018-08-14 ENCOUNTER — Other Ambulatory Visit: Payer: Self-pay | Admitting: Internal Medicine

## 2018-10-08 ENCOUNTER — Other Ambulatory Visit: Payer: Self-pay | Admitting: Internal Medicine

## 2018-10-08 DIAGNOSIS — Z1159 Encounter for screening for other viral diseases: Secondary | ICD-10-CM

## 2018-11-16 DIAGNOSIS — L821 Other seborrheic keratosis: Secondary | ICD-10-CM | POA: Diagnosis not present

## 2018-11-16 DIAGNOSIS — L570 Actinic keratosis: Secondary | ICD-10-CM | POA: Diagnosis not present

## 2018-11-16 DIAGNOSIS — L814 Other melanin hyperpigmentation: Secondary | ICD-10-CM | POA: Diagnosis not present

## 2018-11-19 DIAGNOSIS — H35363 Drusen (degenerative) of macula, bilateral: Secondary | ICD-10-CM | POA: Diagnosis not present

## 2018-11-26 ENCOUNTER — Other Ambulatory Visit: Payer: Self-pay | Admitting: Internal Medicine

## 2018-12-09 ENCOUNTER — Ambulatory Visit: Payer: BLUE CROSS/BLUE SHIELD | Admitting: Internal Medicine

## 2018-12-09 ENCOUNTER — Ambulatory Visit (INDEPENDENT_AMBULATORY_CARE_PROVIDER_SITE_OTHER): Payer: BLUE CROSS/BLUE SHIELD

## 2018-12-09 ENCOUNTER — Encounter: Payer: Self-pay | Admitting: Internal Medicine

## 2018-12-09 VITALS — BP 122/76 | HR 81 | Temp 97.7°F | Resp 14 | Ht 64.0 in | Wt 125.0 lb

## 2018-12-09 DIAGNOSIS — G8929 Other chronic pain: Secondary | ICD-10-CM

## 2018-12-09 DIAGNOSIS — M4802 Spinal stenosis, cervical region: Secondary | ICD-10-CM | POA: Diagnosis not present

## 2018-12-09 DIAGNOSIS — R200 Anesthesia of skin: Secondary | ICD-10-CM

## 2018-12-09 DIAGNOSIS — R202 Paresthesia of skin: Secondary | ICD-10-CM

## 2018-12-09 DIAGNOSIS — E785 Hyperlipidemia, unspecified: Secondary | ICD-10-CM

## 2018-12-09 DIAGNOSIS — M542 Cervicalgia: Secondary | ICD-10-CM

## 2018-12-09 DIAGNOSIS — M47812 Spondylosis without myelopathy or radiculopathy, cervical region: Secondary | ICD-10-CM

## 2018-12-09 DIAGNOSIS — E538 Deficiency of other specified B group vitamins: Secondary | ICD-10-CM

## 2018-12-09 LAB — COMPREHENSIVE METABOLIC PANEL
ALT: 13 U/L (ref 0–35)
AST: 19 U/L (ref 0–37)
Albumin: 4.2 g/dL (ref 3.5–5.2)
Alkaline Phosphatase: 76 U/L (ref 39–117)
BUN: 18 mg/dL (ref 6–23)
CO2: 27 mEq/L (ref 19–32)
Calcium: 9.6 mg/dL (ref 8.4–10.5)
Chloride: 105 mEq/L (ref 96–112)
Creatinine, Ser: 0.93 mg/dL (ref 0.40–1.20)
GFR: 60.02 mL/min (ref >60.00)
Glucose, Bld: 87 mg/dL (ref 70–99)
Potassium: 4.2 mEq/L (ref 3.5–5.1)
Sodium: 140 mEq/L (ref 135–145)
Total Bilirubin: 0.5 mg/dL (ref 0.2–1.2)
Total Protein: 7 g/dL (ref 6.0–8.3)

## 2018-12-09 LAB — LIPID PANEL
Cholesterol: 190 mg/dL (ref 0–200)
HDL: 86.1 mg/dL (ref >39.00)
LDL Cholesterol: 79 mg/dL (ref 0–99)
NonHDL: 103.82
Total CHOL/HDL Ratio: 2
Triglycerides: 124 mg/dL (ref 0.0–149.0)
VLDL: 24.8 mg/dL (ref 0.0–40.0)

## 2018-12-09 NOTE — Progress Notes (Signed)
Subjective:  Patient ID: Taylor [name], female    DOB: 1951/05/09  Age: 68 y.o. MRN: [id number]  CC: The primary encounter diagnosis was Numbness and tingling of left arm and leg. Diagnoses of Chronic midline posterior neck pain, Hyperlipidemia LDL goal <160, B12 deficiency, Numbness of arm, and Cervical spine arthritis were also pertinent to this visit.  HPI Taylor [name] presents for 6 month follow up on thoracic aortic root dilation and hyperlipidemia, IBS and GERD   Osteoporosis  Started Evista in August,  But developed night sweats and dizziness and stopped Evista due to side effects.  Did not have DEXA.   Aortic root dilation noted in 2018  No change 2018 ., august 2019. Continued  Annual follow up angio   Hyperlipidemia:  Tolerating every other day crestor   Taking questran once daily for post chole syndrome/IBS  Persistent breast pain on the right despite diagnostic  Mammogram normal august 2019  DEXA ordered august 2019 but not done due to   Insurance change  Infrequent episodes of numbness of medial upper arm on the right    Outpatient Medications Prior to Visit  Medication Sig Dispense Refill  . Calcium-Vitamin D-Vitamin K (VIACTIV PO) Take as directed    . cholestyramine (QUESTRAN) 4 GM/DOSE powder USE AS DIRECTED PER PACKAGE INSTRUCTIONS1 SCOOPFUL THREE TIMES DAILY WITH MEALS (Patient taking differently: No sig reported) 378 packet 1  . fluticasone (FLONASE) 50 MCG/ACT nasal spray USE 2 SPRAYS IN EACH NOSTRIL EVERY DAY 16 g 2  . omeprazole (PRILOSEC) 40 MG capsule TAKE 1 CAPSULE BY MOUTH EVERY DAY BEFOREBREAKFAST 30 capsule 3  . rosuvastatin (CRESTOR) 10 MG tablet TAKE 1 TABLET BY MOUTH AT BEDTIME (Patient taking differently: Take 10 mg by mouth every other day. ) 30 tablet 5  . VITAMIN D, CHOLECALCIFEROL, PO Take one by mouth daily    . furosemide (LASIX) 20 MG tablet TAKE 1 TABLET BY MOUTH DAILY AS NEEDED FOR FLUID RETENTION (Patient not taking: Reported on 06/03/2018) 90  tablet 1  . raloxifene (EVISTA) 60 MG tablet Take 1 tablet (60 mg total) by mouth daily. (Patient not taking: Reported on 06/03/2018) 30 tablet 5  . raloxifene (EVISTA) 60 MG tablet TAKE 1 TABLET BY MOUTH DAILY (Patient not taking: Reported on 12/09/2018) 30 tablet 2   No facility-administered medications prior to visit.     Review of Systems;  Patient denies headache, fevers, malaise, unintentional weight loss, skin rash, eye pain, sinus congestion and sinus pain, sore throat, dysphagia,  hemoptysis , cough, dyspnea, wheezing, chest pain, palpitations, orthopnea, edema, abdominal pain, nausea, melena, diarrhea, constipation, flank pain, dysuria, hematuria, urinary  Frequency, nocturia, numbness, tingling, seizures,  Focal weakness, Loss of consciousness,  Tremor, insomnia, depression, anxiety, and suicidal ideation.      Objective:  BP 122/76 (BP Location: Left Arm, Patient Position: Sitting, Cuff Size: Normal)   Pulse 81   Temp 97.7 F (36.5 C) (Oral)   Resp 14   Ht 5\' 4"  (1.626 m)   Wt 125 lb (56.7 kg)   SpO2 98%   BMI 21.46 kg/m   BP Readings from Last 3 Encounters:  12/09/18 122/76  06/03/18 122/68  01/21/18 128/86    Wt Readings from Last 3 Encounters:  12/09/18 125 lb (56.7 kg)  06/03/18 121 lb 12.8 oz (55.2 kg)  01/21/18 122 lb 9.6 oz (55.6 kg)    General appearance: alert, cooperative and appears stated age Ears: normal TM's and external ear canals both ears  Throat: lips, mucosa, and tongue normal; teeth and gums normal Neck: no adenopathy, no carotid bruit, supple, symmetrical, trachea midline and thyroid not enlarged, symmetric, no tenderness/mass/nodules Back: symmetric, no curvature. ROM normal. No CVA tenderness. Lungs: clear to auscultation bilaterally Heart: regular rate and rhythm, S1, S2 normal, no murmur, click, rub or gallop Abdomen: soft, non-tender; bowel sounds normal; no masses,  no organomegaly Pulses: 2+ and symmetric Skin: Skin color, texture,  turgor normal. No rashes or lesions Lymph nodes: Cervical, supraclavicular, and axillary nodes normal.  No results found for: HGBA1C  Lab Results  Component Value Date   CREATININE 0.93 12/09/2018   CREATININE 0.94 06/03/2018   CREATININE 0.88 05/05/2017    Lab Results  Component Value Date   WBC 8.0 05/05/2017   HGB 12.9 05/05/2017   HCT 38.9 05/05/2017   PLT 281.0 05/05/2017   GLUCOSE 87 12/09/2018   CHOL 190 12/09/2018   TRIG 124.0 12/09/2018   HDL 86.10 12/09/2018   LDLDIRECT 122.6 04/23/2013   LDLCALC 79 12/09/2018   ALT 13 12/09/2018   AST 19 12/09/2018   NA 140 12/09/2018   K 4.2 12/09/2018   CL 105 12/09/2018   CREATININE 0.93 12/09/2018   BUN 18 12/09/2018   CO2 27 12/09/2018   TSH 2.80 06/03/2018    Ct Angio Chest Aorta W/cm &/or Wo/cm  Result Date: 06/11/2018 CLINICAL DATA:  Aortic root dilatation, aneurysm, follow-up, currently asymptomatic EXAM: CT ANGIOGRAPHY CHEST WITH CONTRAST TECHNIQUE: Multidetector CT imaging of the chest was performed using the standard protocol during bolus administration of intravenous contrast. Multiplanar CT image reconstructions and MIPs were obtained to evaluate the vascular anatomy. CONTRAST:  62mL ISOVUE-370 IOPAMIDOL (ISOVUE-370) INJECTION 76% COMPARISON:  05/28/2017 FINDINGS: Cardiovascular: Heart size normal. No pericardial effusion. Satisfactory opacification of pulmonary arteries noted, and there is no evidence of pulmonary emboli. Thoracic aortic transverse dimensions as below: 3.5 cm sinuses of Valsalva 3.7 cm proximal ascending 3.1 cm distal ascending/proximal arch (stable by my measurement) 2.4 cm distal arch 2.6 cm proximal descending 2.4 cm distal descending No dissection or stenosis. Classic 3 vessel brachiocephalic arterial origin anatomy without proximal stenosis. No significant atheromatous irregularity. Visualized portions of proximal abdominal aorta unremarkable. Mediastinum/Nodes: No hilar or mediastinal adenopathy.  Lungs/Pleura: No pleural effusion. No pneumothorax. Linear scarring or subsegmental atelectasis in the left lower lobe stable. Stable calcified granuloma, superior segment left lower lobe. No airspace disease. Upper Abdomen: No acute findings Musculoskeletal: No chest wall abnormality. No acute or significant osseous findings. Review of the MIP images confirms the above findings. IMPRESSION: 1. Stable 3.1 cm dilatation of the proximal aortic arch. Recommend annual imaging followup by CTA or MRA. This recommendation follows 2010 ACCF/AHA/AATS/ACR/ASA/SCA/SCAI/SIR/STS/SVM Guidelines for the Diagnosis and Management of Patients with Thoracic Aortic Disease. Circulation.2010; [id number] Electronically Signed   By: Lucrezia Europe M.D.   On: 06/11/2018 10:02    Assessment & Plan:   Problem List Items Addressed This Visit    Numbness of arm    May be due to b12 deficiency vs cervical disk disease.  intermittent      Hyperlipidemia LDL goal <160   Relevant Orders   Comprehensive metabolic panel (Completed)   Lipid panel (Completed)   Cervical spine arthritis    Suggested by plain films done today       B12 deficiency    New diagnosis,  Needs injections,  IF ab and RBC folate      Relevant Orders   RBC Folate   Intrinsic Factor  Antibodies    Other Visit Diagnoses    Numbness and tingling of left arm and leg    -  Primary   Relevant Orders   DG Chest 2 View (Completed)   DG Cervical Spine Complete (Completed)   Vitamin B12 (Completed)   Chronic midline posterior neck pain       Relevant Orders   DG Cervical Spine Complete (Completed)      I have discontinued Taylor [name]'s raloxifene, furosemide, and raloxifene. I am also having her maintain her (VITAMIN D, CHOLECALCIFEROL, PO), Calcium-Vitamin D-Vitamin K (VIACTIV PO), fluticasone, omeprazole, rosuvastatin, and cholestyramine.  No orders of the defined types were placed in this encounter. A total of 25 minutes of face to face time  was spent with patient more than half of which was spent in counselling about the above mentioned conditions  and coordination of care   Medications Discontinued During This Encounter  Medication Reason  . furosemide (LASIX) 20 MG tablet Patient has not taken in last 30 days  . raloxifene (EVISTA) 60 MG tablet Patient Preference  . raloxifene (EVISTA) 60 MG tablet Patient Preference    Follow-up: No follow-ups on file.   Crecencio Mc, MD

## 2018-12-09 NOTE — Patient Instructions (Signed)
Pinched Nerve  A pinched nerve is an injury that occurs when too much pressure is placed on a nerve. This pressure can cause pain, burning, and muscle weakness in places that the nerve supplies feeling to, such as an arm, hand, or leg, or the back or neck. If a nerve is severely pinched or has been pinched for a long time, permanent nerve damage can occur.  What are the causes?  This condition may be caused by:   A nerve passing through a narrow area between bones or other body structures.   Arthritis that causes bones to press on a nerve.   Loss of blood supply to a nerve.   A nerve being stretched from an injury.   A sudden injury with swelling.   Long-term wear on the nerve.   Age-related changes in the spine.  What are the signs or symptoms?  The most common symptoms of a pinched nerve are feeling a tingling sensation and numbness. Other symptoms include:   Pain that spreads from one area of the body part to another.   A burning feeling.   Muscle weakness.  How is this diagnosed?  This condition may be diagnosed based on:   A physical exam. During the exam, your health care provider will:  ? Check for numbness and muscle weakness.  ? Move affected body parts to test for pain.   X-rays to check for bone damage.   A MRI or CT scan to check for conditions that may be causing nerve damage.   A muscle test (electromyogram, EMG) to evaluate how your muscles and nerves communicate.  How is this treated?         A pinched nerve is usually treated first by:   Resting the affected body area.   Using devices to help you move without pain (supportive or protective devices), such as a splint, brace, or neck collar.  Other treatments depend on your symptoms and the amount of nerve damage you have. Other treatments may include:   Medicines, such as:  ? Injections of numbing medicine.  ? NSAIDs.  ? Pain medicines.  ? Steroid medicines. These may be given as a pill or as an injection.   Physical therapy to  relieve pain, maintain movement, and improve muscle strength.   Surgery. This may be done if other treatments do not work.  Follow these instructions at home:          Take over-the-counter and prescription medicines only as told by your health care provider.   Wear supportive or protective devices as told by your health care provider.   Do physical therapy exercises as directed.   Ask your health care provider what activities are safe for you.   Rest as needed.   If directed, put ice on the affected area:  ? Put ice in a plastic bag.  ? Place a towel between your skin and the bag.  ? Leave the ice on for 20 minutes, 2-3 times a day.   If directed, apply heat to the affected area. Use the heat source that your health care provider recommends, such as a moist heat pack or a heating pad.  ? Place a towel between your skin and the heat source.  ? Leave the heat on for 20-30 minutes.  ? Remove the heat if your skin turns bright red. This is especially important if you are unable to feel pain, heat, or cold. You may have a greater risk of   getting burned.   Do not drive or use heavy machinery while taking prescription pain medicine.   Keep all follow-up visits as told by your health care provider. This is important.  Contact a health care provider if:   Your condition does not improve with treatment.   Your pain, numbness, or weakness suddenly gets worse.  Get help right away if you:   Have loss of bladder control (urinary incontinence) or you cannot urinate.   Cannot control bowel movements (fecal incontinence).   Have new weakness in your arms or legs.  Summary   A pinched nerve is an injury that occurs when too much pressure is placed on a nerve.   This pressure can cause pain, burning, and muscle weakness in places that the nerve supplies feeling to, such as an arm, hand, or leg, or the back or neck.   Take over-the-counter and prescription medicines only as told by your health care provider.   Ask  your health care provider what activities are safe for you while you are having symptoms.  This information is not intended to replace advice given to you by your health care provider. Make sure you discuss any questions you have with your health care provider.  Document Released: 09/20/2002 Document Revised: 10/14/2017 Document Reviewed: 10/14/2017  Elsevier Interactive Patient Education  2019 [employer]vier Inc[employer]

## 2018-12-10 DIAGNOSIS — E538 Deficiency of other specified B group vitamins: Secondary | ICD-10-CM | POA: Insufficient documentation

## 2018-12-10 DIAGNOSIS — M47812 Spondylosis without myelopathy or radiculopathy, cervical region: Secondary | ICD-10-CM | POA: Insufficient documentation

## 2018-12-10 DIAGNOSIS — R200 Anesthesia of skin: Secondary | ICD-10-CM | POA: Insufficient documentation

## 2018-12-10 LAB — VITAMIN B12: Vitamin B-12: 177 pg/mL — ABNORMAL LOW (ref 211–911)

## 2018-12-10 NOTE — Assessment & Plan Note (Signed)
New diagnosis,  Needs injections,  IF ab and RBC folate

## 2018-12-10 NOTE — Assessment & Plan Note (Signed)
Suggested by plain films done today

## 2018-12-10 NOTE — Assessment & Plan Note (Signed)
May be due to b12 deficiency vs cervical disk disease.  intermittent

## 2018-12-11 ENCOUNTER — Other Ambulatory Visit: Payer: Self-pay | Admitting: Internal Medicine

## 2018-12-11 DIAGNOSIS — M810 Age-related osteoporosis without current pathological fracture: Secondary | ICD-10-CM

## 2018-12-11 DIAGNOSIS — M47812 Spondylosis without myelopathy or radiculopathy, cervical region: Secondary | ICD-10-CM

## 2018-12-11 DIAGNOSIS — R200 Anesthesia of skin: Secondary | ICD-10-CM

## 2018-12-11 NOTE — Progress Notes (Signed)
a 

## 2018-12-15 ENCOUNTER — Ambulatory Visit (INDEPENDENT_AMBULATORY_CARE_PROVIDER_SITE_OTHER): Payer: BLUE CROSS/BLUE SHIELD

## 2018-12-15 DIAGNOSIS — E538 Deficiency of other specified B group vitamins: Secondary | ICD-10-CM | POA: Diagnosis not present

## 2018-12-15 MED ORDER — CYANOCOBALAMIN 1000 MCG/ML IJ SOLN
1000.0000 ug | Freq: Once | INTRAMUSCULAR | Status: AC
  Administered 2018-12-15: 1000 ug via INTRAMUSCULAR

## 2018-12-15 NOTE — Progress Notes (Addendum)
Patient presents today for b12 injection. Left deltoid. Patient voiced no concerns nor showed any signs of distress during injection.   Reviewed.  Dr Scott 

## 2018-12-22 ENCOUNTER — Ambulatory Visit (INDEPENDENT_AMBULATORY_CARE_PROVIDER_SITE_OTHER): Payer: BLUE CROSS/BLUE SHIELD

## 2018-12-22 ENCOUNTER — Telehealth: Payer: Self-pay

## 2018-12-22 DIAGNOSIS — E538 Deficiency of other specified B group vitamins: Secondary | ICD-10-CM

## 2018-12-22 MED ORDER — CYANOCOBALAMIN 1000 MCG/ML IJ SOLN
1000.0000 ug | Freq: Once | INTRAMUSCULAR | Status: AC
  Administered 2018-12-22: 1000 ug via INTRAMUSCULAR

## 2018-12-22 NOTE — Telephone Encounter (Signed)
FYI

## 2018-12-22 NOTE — Telephone Encounter (Signed)
Copied from [place] [phone]. Topic: General - Other >> Dec 22, 2018  8:33 AM Virl Axe D wrote: Reason for CRM: Pt stated she had to reschedule her MRI. It is now scheduled for 01/19/19 at 10:30am. Please advise.

## 2018-12-22 NOTE — Progress Notes (Signed)
Patient came in today for B-12 injection in right deltoid. Patient tolerated well.  

## 2018-12-23 ENCOUNTER — Ambulatory Visit: Payer: BLUE CROSS/BLUE SHIELD

## 2018-12-28 ENCOUNTER — Encounter: Payer: Self-pay | Admitting: Internal Medicine

## 2018-12-30 ENCOUNTER — Ambulatory Visit (INDEPENDENT_AMBULATORY_CARE_PROVIDER_SITE_OTHER): Payer: BLUE CROSS/BLUE SHIELD

## 2018-12-30 ENCOUNTER — Other Ambulatory Visit: Payer: Self-pay

## 2018-12-30 DIAGNOSIS — E538 Deficiency of other specified B group vitamins: Secondary | ICD-10-CM

## 2018-12-30 MED ORDER — CYANOCOBALAMIN 1000 MCG/ML IJ SOLN
1000.0000 ug | Freq: Once | INTRAMUSCULAR | Status: AC
  Administered 2018-12-30: 1000 ug via INTRAMUSCULAR

## 2018-12-30 NOTE — Progress Notes (Signed)
Taylor [name] presents today for injection per MD orders. B12 injection administered IM in left Upper Arm. Administration without incident. Patient tolerated well. Nina,cma

## 2019-01-06 ENCOUNTER — Ambulatory Visit (INDEPENDENT_AMBULATORY_CARE_PROVIDER_SITE_OTHER): Payer: BLUE CROSS/BLUE SHIELD

## 2019-01-06 ENCOUNTER — Other Ambulatory Visit: Payer: Self-pay

## 2019-01-06 DIAGNOSIS — E538 Deficiency of other specified B group vitamins: Secondary | ICD-10-CM | POA: Diagnosis not present

## 2019-01-06 MED ORDER — CYANOCOBALAMIN 1000 MCG/ML IJ SOLN
1000.0000 ug | Freq: Once | INTRAMUSCULAR | Status: AC
  Administered 2019-01-06: 1000 ug via INTRAMUSCULAR

## 2019-01-06 NOTE — Progress Notes (Signed)
Taylor [name] presents today for injection per MD orders. B12 injection administered IM in right Upper Arm. Administration without incident. Patient tolerated well.  Nina,cma

## 2019-01-19 ENCOUNTER — Ambulatory Visit: Payer: BLUE CROSS/BLUE SHIELD

## 2019-01-21 ENCOUNTER — Other Ambulatory Visit: Payer: Self-pay

## 2019-01-21 ENCOUNTER — Other Ambulatory Visit (INDEPENDENT_AMBULATORY_CARE_PROVIDER_SITE_OTHER): Payer: BLUE CROSS/BLUE SHIELD

## 2019-01-21 DIAGNOSIS — E538 Deficiency of other specified B group vitamins: Secondary | ICD-10-CM | POA: Diagnosis not present

## 2019-01-26 LAB — FOLATE RBC: RBC Folate: 530 ng/mL RBC (ref >280)

## 2019-01-26 LAB — INTRINSIC FACTOR ANTIBODIES: Intrinsic Factor: NEGATIVE

## 2019-02-18 ENCOUNTER — Ambulatory Visit: Payer: BLUE CROSS/BLUE SHIELD

## 2019-03-02 ENCOUNTER — Other Ambulatory Visit: Payer: Self-pay | Admitting: Internal Medicine

## 2019-03-02 DIAGNOSIS — N644 Mastodynia: Secondary | ICD-10-CM

## 2019-03-03 ENCOUNTER — Ambulatory Visit: Payer: BLUE CROSS/BLUE SHIELD

## 2019-03-03 ENCOUNTER — Telehealth: Payer: Self-pay

## 2019-03-03 DIAGNOSIS — N644 Mastodynia: Secondary | ICD-10-CM

## 2019-03-03 NOTE — Telephone Encounter (Signed)
-----   Message from Mordecai Maes sent at 03/03/2019  9:23 AM EDT ----- Regarding: FW: mammogram I gave you the wrong orders!!! I need the [id number] right ultrasound [id number] left ultrasound.  SORRY!! Melissa ----- Message ----- From: Mordecai Maes Sent: 03/03/2019   8:50 AM EDT To: Sandy Salaam, CMA Subject: mammogram                                      Andreas Blower, Dr. Darrick Huntsman has ordered a diagnostic mammogram for this patient, but the orders are wrong. Can you change them for me?  They need to be: [id number] diagnostic mammogram [id number] right ultrasound [id number] left ultrasound.  Diagnosis: N64.4 (ICD-10-CM) - Diffuse non-cyclical breast pain  Reason for Exam (SYMPTOM OR DIAGNOSIS REQUIRED) diffuse breast pain , postmenopausal  Preferred imaging location? [facility]   Thank you! Melissa

## 2019-03-03 NOTE — Telephone Encounter (Signed)
Corrected orders

## 2019-03-11 ENCOUNTER — Other Ambulatory Visit: Payer: Self-pay

## 2019-03-11 ENCOUNTER — Ambulatory Visit
Admission: RE | Admit: 2019-03-11 | Discharge: 2019-03-11 | Disposition: A | Discharge status: Home / Self Care | Payer: Medicare Other | Source: Ambulatory Visit | Attending: Internal Medicine | Admitting: Internal Medicine

## 2019-03-11 DIAGNOSIS — N644 Mastodynia: Secondary | ICD-10-CM

## 2019-03-11 DIAGNOSIS — R922 Inconclusive mammogram: Secondary | ICD-10-CM | POA: Diagnosis not present

## 2019-03-13 DIAGNOSIS — H16002 Unspecified corneal ulcer, left eye: Secondary | ICD-10-CM | POA: Diagnosis not present

## 2019-03-14 DIAGNOSIS — H40053 Ocular hypertension, bilateral: Secondary | ICD-10-CM | POA: Diagnosis not present

## 2019-03-14 DIAGNOSIS — H16002 Unspecified corneal ulcer, left eye: Secondary | ICD-10-CM | POA: Diagnosis not present

## 2019-03-14 DIAGNOSIS — H2000 Unspecified acute and subacute iridocyclitis: Secondary | ICD-10-CM | POA: Diagnosis not present

## 2019-03-15 DIAGNOSIS — H2000 Unspecified acute and subacute iridocyclitis: Secondary | ICD-10-CM | POA: Diagnosis not present

## 2019-03-15 DIAGNOSIS — H16002 Unspecified corneal ulcer, left eye: Secondary | ICD-10-CM | POA: Diagnosis not present

## 2019-03-15 DIAGNOSIS — H40053 Ocular hypertension, bilateral: Secondary | ICD-10-CM | POA: Diagnosis not present

## 2019-03-18 ENCOUNTER — Ambulatory Visit: Payer: Medicare Other

## 2019-03-19 DIAGNOSIS — H16002 Unspecified corneal ulcer, left eye: Secondary | ICD-10-CM | POA: Diagnosis not present

## 2019-03-19 DIAGNOSIS — H40052 Ocular hypertension, left eye: Secondary | ICD-10-CM | POA: Diagnosis not present

## 2019-03-19 DIAGNOSIS — H2000 Unspecified acute and subacute iridocyclitis: Secondary | ICD-10-CM | POA: Diagnosis not present

## 2019-03-23 ENCOUNTER — Other Ambulatory Visit: Payer: BLUE CROSS/BLUE SHIELD

## 2019-03-26 DIAGNOSIS — H16002 Unspecified corneal ulcer, left eye: Secondary | ICD-10-CM | POA: Diagnosis not present

## 2019-03-26 DIAGNOSIS — H40052 Ocular hypertension, left eye: Secondary | ICD-10-CM | POA: Diagnosis not present

## 2019-03-26 DIAGNOSIS — H2000 Unspecified acute and subacute iridocyclitis: Secondary | ICD-10-CM | POA: Diagnosis not present

## 2019-03-29 DIAGNOSIS — L820 Inflamed seborrheic keratosis: Secondary | ICD-10-CM | POA: Diagnosis not present

## 2019-03-29 DIAGNOSIS — L570 Actinic keratosis: Secondary | ICD-10-CM | POA: Diagnosis not present

## 2019-03-30 ENCOUNTER — Ambulatory Visit
Admission: RE | Admit: 2019-03-30 | Discharge: 2019-03-30 | Disposition: A | Discharge status: Home / Self Care | Payer: BC Managed Care – PPO | Source: Ambulatory Visit | Attending: Internal Medicine | Admitting: Internal Medicine

## 2019-03-30 ENCOUNTER — Other Ambulatory Visit: Payer: Self-pay

## 2019-03-30 DIAGNOSIS — R200 Anesthesia of skin: Secondary | ICD-10-CM

## 2019-03-30 DIAGNOSIS — M50223 Other cervical disc displacement at C6-C7 level: Secondary | ICD-10-CM | POA: Diagnosis not present

## 2019-03-30 DIAGNOSIS — M47812 Spondylosis without myelopathy or radiculopathy, cervical region: Secondary | ICD-10-CM | POA: Diagnosis not present

## 2019-03-30 DIAGNOSIS — M4802 Spinal stenosis, cervical region: Secondary | ICD-10-CM | POA: Diagnosis not present

## 2019-04-02 DIAGNOSIS — H401121 Primary open-angle glaucoma, left eye, mild stage: Secondary | ICD-10-CM | POA: Diagnosis not present

## 2019-04-13 ENCOUNTER — Other Ambulatory Visit: Payer: BLUE CROSS/BLUE SHIELD

## 2019-04-30 ENCOUNTER — Other Ambulatory Visit: Payer: Self-pay | Admitting: Internal Medicine

## 2019-05-19 ENCOUNTER — Other Ambulatory Visit: Payer: Self-pay

## 2019-05-19 ENCOUNTER — Ambulatory Visit
Admission: RE | Admit: 2019-05-19 | Discharge: 2019-05-19 | Disposition: A | Discharge status: Home / Self Care | Payer: Medicare Other | Source: Ambulatory Visit | Attending: Internal Medicine | Admitting: Internal Medicine

## 2019-05-19 DIAGNOSIS — M810 Age-related osteoporosis without current pathological fracture: Secondary | ICD-10-CM

## 2019-06-10 ENCOUNTER — Other Ambulatory Visit: Payer: Self-pay

## 2019-06-14 ENCOUNTER — Encounter: Payer: Self-pay | Admitting: Internal Medicine

## 2019-06-14 ENCOUNTER — Ambulatory Visit (INDEPENDENT_AMBULATORY_CARE_PROVIDER_SITE_OTHER): Payer: Medicare Other | Admitting: Internal Medicine

## 2019-06-14 ENCOUNTER — Other Ambulatory Visit: Payer: Self-pay

## 2019-06-14 VITALS — BP 102/78 | HR 81 | Temp 97.2°F | Resp 15 | Ht 64.0 in | Wt 123.6 lb

## 2019-06-14 DIAGNOSIS — K2100 Gastro-esophageal reflux disease with esophagitis, without bleeding: Secondary | ICD-10-CM

## 2019-06-14 DIAGNOSIS — Z79899 Other long term (current) drug therapy: Secondary | ICD-10-CM | POA: Diagnosis not present

## 2019-06-14 DIAGNOSIS — E538 Deficiency of other specified B group vitamins: Secondary | ICD-10-CM

## 2019-06-14 DIAGNOSIS — M47812 Spondylosis without myelopathy or radiculopathy, cervical region: Secondary | ICD-10-CM | POA: Diagnosis not present

## 2019-06-14 DIAGNOSIS — I77810 Thoracic aortic ectasia: Secondary | ICD-10-CM | POA: Diagnosis not present

## 2019-06-14 DIAGNOSIS — Z23 Encounter for immunization: Secondary | ICD-10-CM

## 2019-06-14 LAB — COMPREHENSIVE METABOLIC PANEL
ALT: 14 U/L (ref 0–35)
AST: 23 U/L (ref 0–37)
Albumin: 4.1 g/dL (ref 3.5–5.2)
Alkaline Phosphatase: 67 U/L (ref 39–117)
BUN: 13 mg/dL (ref 6–23)
CO2: 28 mEq/L (ref 19–32)
Calcium: 9.5 mg/dL (ref 8.4–10.5)
Chloride: 104 mEq/L (ref 96–112)
Creatinine, Ser: 0.97 mg/dL (ref 0.40–1.20)
GFR: 57.08 mL/min — ABNORMAL LOW (ref >60.00)
Glucose, Bld: 96 mg/dL (ref 70–99)
Potassium: 4.2 mEq/L (ref 3.5–5.1)
Sodium: 138 mEq/L (ref 135–145)
Total Bilirubin: 0.5 mg/dL (ref 0.2–1.2)
Total Protein: 7.1 g/dL (ref 6.0–8.3)

## 2019-06-14 NOTE — Patient Instructions (Signed)
You are due for repeat imaging of the aortic root;  I will order that for you and coy Dr Ubaldo Glassing on it  You have osteopenia on yur recent DEXA  Start walking and working upper body with light weights ; work on getting 1200 to 1800 mg calcium daily and 1000 to 2000 ius of D3 dAILY  We will repeat in one year    Health Maintenance After Age 68 After age 2, you are at a higher risk for certain long-term diseases and infections as well as injuries from falls. Falls are a major cause of broken bones and head injuries in people who are older than age 55. Getting regular preventive care can help to keep you healthy and well. Preventive care includes getting regular testing and making lifestyle changes as recommended by your health care provider. Talk with your health care provider about:  Which screenings and tests you should have. A screening is a test that checks for a disease when you have no symptoms.  A diet and exercise plan that is right for you. What should I know about screenings and tests to prevent falls? Screening and testing are the best ways to find a health problem early. Early diagnosis and treatment give you the best chance of managing medical conditions that are common after age 85. Certain conditions and lifestyle choices may make you more likely to have a fall. Your health care provider may recommend:  Regular vision checks. Poor vision and conditions such as cataracts can make you more likely to have a fall. If you wear glasses, make sure to get your prescription updated if your vision changes.  Medicine review. Work with your health care provider to regularly review all of the medicines you are taking, including over-the-counter medicines. Ask your health care provider about any side effects that may make you more likely to have a fall. Tell your health care provider if any medicines that you take make you feel dizzy or sleepy.  Osteoporosis screening. Osteoporosis is a condition  that causes the bones to get weaker. This can make the bones weak and cause them to break more easily.  Blood pressure screening. Blood pressure changes and medicines to control blood pressure can make you feel dizzy.  Strength and balance checks. Your health care provider may recommend certain tests to check your strength and balance while standing, walking, or changing positions.  Foot health exam. Foot pain and numbness, as well as not wearing proper footwear, can make you more likely to have a fall.  Depression screening. You may be more likely to have a fall if you have a fear of falling, feel emotionally low, or feel unable to do activities that you used to do.  Alcohol use screening. Using too much alcohol can affect your balance and may make you more likely to have a fall. What actions can I take to lower my risk of falls? General instructions  Talk with your health care provider about your risks for falling. Tell your health care provider if: ? You fall. Be sure to tell your health care provider about all falls, even ones that seem minor. ? You feel dizzy, sleepy, or off-balance.  Take over-the-counter and prescription medicines only as told by your health care provider. These include any supplements.  Eat a healthy diet and maintain a healthy weight. A healthy diet includes low-fat dairy products, low-fat (lean) meats, and fiber from whole grains, beans, and lots of fruits and vegetables. Home safety  Remove any tripping hazards, such as rugs, cords, and clutter.  Install safety equipment such as grab bars in bathrooms and safety rails on stairs.  Keep rooms and walkways well-lit. Activity   Follow a regular exercise program to stay fit. This will help you maintain your balance. Ask your health care provider what types of exercise are appropriate for you.  If you need a cane or walker, use it as recommended by your health care provider.  Wear supportive shoes that have  nonskid soles. Lifestyle  Do not drink alcohol if your health care provider tells you not to drink.  If you drink alcohol, limit how much you have: ? 0-1 drink a day for women. ? 0-2 drinks a day for men.  Be aware of how much alcohol is in your drink. In the [place] one drink equals one typical bottle of beer (12 oz), one-half glass of wine (5 oz), or one shot of hard liquor (1 oz).  Do not use any products that contain nicotine or tobacco, such as cigarettes and e-cigarettes. If you need help quitting, ask your health care provider. Summary  Having a healthy lifestyle and getting preventive care can help to protect your health and wellness after age 68.  Screening and testing are the best way to find a health problem early and help you avoid having a fall. Early diagnosis and treatment give you the best chance for managing medical conditions that are more common for people who are older than age 865.  Falls are a major cause of broken bones and head injuries in people who are older than age 68. Take precautions to prevent a fall at home.  Work with your health care provider to learn what changes you can make to improve your health and wellness and to prevent falls. This information is not intended to replace advice given to you by your health care provider. Make sure you discuss any questions you have with your health care provider. Document Released: 08/13/2017 Document Revised: 01/21/2019 Document Reviewed: 08/13/2017 Elsevier Patient Education  2020 [employer]

## 2019-06-14 NOTE — Assessment & Plan Note (Signed)
Continue famotidine 20 mg unless symptoms escalate and need PPI

## 2019-06-14 NOTE — Progress Notes (Signed)
Patient ID: Taylor [name], female    DOB: 1950/11/25  Age: 68 y.o. MRN: [id number]  The patient is here for annual follow up and management of other chronic and acute problems.   The risk factors are reflected in the social history.  The roster of all physicians providing medical care to patient - is listed in the Snapshot section of the chart.   Activities of daily living:  The patient is 100% independent in all ADLs: dressing, toileting, feeding as well as independent mobility  Home safety : The patient has smoke detectors in the home. They wear seatbelts.  There are no firearms at home. There is no violence in the home.   There is no risks for hepatitis, STDs or HIV. There is no   history of blood transfusion. They have no travel history to infectious disease endemic areas of the world.  The patient has seen their dentist in the last six month. They have seen their eye doctor in the last year. They admit to slight hearing difficulty with regard to whispered voices and some television programs.  They have deferred audiologic testing in the last year.  They do not  have excessive sun exposure. Discussed the need for sun protection: hats, long sleeves and use of sunscreen if there is significant sun exposure.   Diet: the importance of a healthy diet is discussed. They do have a healthy diet.  The benefits of regular aerobic exercise were discussed. She walks 4 times per week ,  20 minutes.   Depression screen: there are no signs or vegative symptoms of depression- irritability, change in appetite, anhedonia, sadness/tearfullness.  Cognitive assessment: the patient manages all their financial and personal affairs and is actively engaged. They could relate day,date,year and events; recalled 2/3 objects at 3 minutes; performed clock-face test normally.  The following portions of the patient's history were reviewed and updated as appropriate: allergies, current medications, past family history, past  medical history,  past surgical history, past social history  and problem list.  Visual acuity was not assessed per patient preference since she has regular follow up with her ophthalmologist. Hearing and body mass index were assessed and reviewed.   During the course of the visit the patient was educated and counseled about appropriate screening and preventive services including : fall prevention , diabetes screening, nutrition counseling, colorectal cancer screening, and recommended immunizations.    CC: The primary encounter diagnosis was Long-term use of high-risk medication. Diagnoses of Need for immunization against influenza, Aortic root dilatation (HCC), Gastroesophageal reflux disease with esophagitis, B12 deficiency, and Cervical spine arthritis were also pertinent to this visit.   1) cervical spine stenosis suggested by MRI.  Symptoms of pain and tingling or arms is intermittent and infrequent.  No weakness detected.   2) cataract surgery in October , left eye,  Traumatic after prolonged corneal infection   3) osteopenia  -2.3  by DXA 2019  prior trials of fosomax ,    And evista not tolerated . Discussed need for exercise   4)  Taking oral b12   5) dilated aortic root:  Needs CT angio w and w/o cm done at [facility] at [place] normal May 2020  Colonoscopy 2014 ten year follow up EGD 2014: no barrett's  No follow up     [name] has a past medical history of Chest pain, Fever blister, Hyperlipidemia, Irritable bowel syndrome, Mitral valve prolapse, Normal cardiac stress test (2007), Osteoporosis, and Pill esophagitis (  10/23/2012).   She has a past surgical history that includes Cholecystectomy (Approx - 2002).   Her family history includes Aortic aneurysm in her paternal grandfather; Heart disease (age of onset: 39) in her father; Heart disease (age of onset: 10) in her brother; Multiple sclerosis in her mother.She reports that she has never smoked. She has never  used smokeless tobacco. She reports current alcohol use of about 3.0 standard drinks of alcohol per week. She reports that she does not use drugs.  Outpatient Medications Prior to Visit  Medication Sig Dispense Refill  . Calcium-Vitamin D-Vitamin K (VIACTIV PO) Take as directed    . cholestyramine (QUESTRAN) 4 GM/DOSE powder USE 1 SCOOPFUL THREE TIMES DAILY WITH MEALS 378 g 0  . fluticasone (FLONASE) 50 MCG/ACT nasal spray USE 2 SPRAYS IN EACH NOSTRIL EVERY DAY 16 g 2  . rosuvastatin (CRESTOR) 10 MG tablet TAKE 1 TABLET BY MOUTH AT BEDTIME (Patient taking differently: Take 10 mg by mouth every other day. ) 30 tablet 5  . timolol (TIMOPTIC) 0.5 % ophthalmic solution     . VITAMIN D, CHOLECALCIFEROL, PO Take one by mouth daily    . omeprazole (PRILOSEC) 40 MG capsule TAKE 1 CAPSULE BY MOUTH EVERY DAY BEFOREBREAKFAST 30 capsule 3   No facility-administered medications prior to visit.     Review of Systems  Objective:  BP 102/78 (BP Location: Left Arm, Patient Position: Sitting, Cuff Size: Normal)   Pulse 81   Temp (!) 97.2 F (36.2 C) (Temporal)   Resp 15   Ht 5\' 4"  (1.626 m)   Wt 123 lb 9.6 oz (56.1 kg)   SpO2 96%   BMI 21.22 kg/m   Physical Exam  General appearance: alert, cooperative and appears stated age Head: Normocephalic, without obvious abnormality, atraumatic Eyes: conjunctivae/corneas clear. PERRL, EOM's intact. Fundi benign. Ears: normal TM's and external ear canals both ears Nose: Nares normal. Septum midline. Mucosa normal. No drainage or sinus tenderness. Throat: lips, mucosa, and tongue normal; teeth and gums normal Neck: no adenopathy, no carotid bruit, no JVD, supple, symmetrical, trachea midline and thyroid not enlarged, symmetric, no tenderness/mass/nodules Lungs: clear to auscultation bilaterally Breasts: normal appearance, no masses or tenderness Heart: regular rate and rhythm, S1, S2 normal, no murmur, click, rub or gallop Abdomen: soft, non-tender; bowel  sounds normal; no masses,  no organomegaly Extremities: extremities normal, atraumatic, no cyanosis or edema Pulses: 2+ and symmetric Skin: Skin color, texture, turgor normal. No rashes or lesions Neurologic: Alert and oriented X 3, normal strength and tone. Normal symmetric reflexes. Normal coordination and gait.     Assessment & Plan:   Problem List Items Addressed This Visit      Unprioritized   GERD (gastroesophageal reflux disease)    Continue famotidine 20 mg unless symptoms escalate and need PPI      Aortic root dilatation (HCC)    Last year's CT noted slight decrease I n diameter of root,  Repeat annually  Ordered.       Relevant Orders   CT Angio Chest W/Cm &/Or Wo Cm   B12 deficiency    Recurrent,  With normal intrinsic factor and rbc folate . Continue oral supplements       Cervical spine arthritis    Suggested by plain films and MRI.  She is asymptomatic most days,  Referral deferred        Other Visit Diagnoses    Long-term use of high-risk medication    -  Primary  Relevant Orders   Comprehensive metabolic panel (Completed)   Need for immunization against influenza       Relevant Orders   Flu Vaccine QUAD High Dose(Fluad) (Completed)     A total of 40 minutes was spent with patient more than half of which was spent in counseling patient on the above mentioned issues , reviewing and explaining recent labs and imaging studies done, and coordination of care.   I have discontinued Winda B. Benningfield's omeprazole. I am also having her maintain her (VITAMIN D, CHOLECALCIFEROL, PO), Calcium-Vitamin D-Vitamin K (VIACTIV PO), fluticasone, rosuvastatin, cholestyramine, and timolol.  No orders of the defined types were placed in this encounter.   Medications Discontinued During This Encounter  Medication Reason  . omeprazole (PRILOSEC) 40 MG capsule Patient has not taken in last 30 days    Follow-up: No follow-ups on file.   Crecencio Mc, MD

## 2019-06-14 NOTE — Assessment & Plan Note (Signed)
Suggested by plain films and MRI.  She is asymptomatic most days,  Referral deferred

## 2019-06-14 NOTE — Assessment & Plan Note (Signed)
Last year's CT noted slight decrease I n diameter of root,  Repeat annually  Ordered.

## 2019-06-14 NOTE — Assessment & Plan Note (Signed)
Recurrent,  With normal intrinsic factor and rbc folate . Continue oral supplements

## 2019-06-24 ENCOUNTER — Ambulatory Visit: Payer: Medicare Other

## 2019-06-29 ENCOUNTER — Other Ambulatory Visit: Payer: Self-pay | Admitting: Internal Medicine

## 2019-06-30 ENCOUNTER — Ambulatory Visit
Admission: RE | Admit: 2019-06-30 | Discharge: 2019-06-30 | Disposition: A | Discharge status: Home / Self Care | Payer: Medicare Other | Source: Ambulatory Visit | Attending: Internal Medicine | Admitting: Internal Medicine

## 2019-06-30 ENCOUNTER — Other Ambulatory Visit: Payer: Self-pay

## 2019-06-30 DIAGNOSIS — I77810 Thoracic aortic ectasia: Secondary | ICD-10-CM

## 2019-06-30 MED ORDER — IOHEXOL 350 MG/ML SOLN
75.0000 mL | Freq: Once | INTRAVENOUS | Status: AC | PRN
  Administered 2019-06-30: 75 mL via INTRAVENOUS

## 2019-07-15 HISTORY — PX: CATARACT EXTRACTION: SUR2

## 2019-08-11 ENCOUNTER — Other Ambulatory Visit: Payer: Self-pay | Admitting: Internal Medicine

## 2019-08-11 DIAGNOSIS — Z1159 Encounter for screening for other viral diseases: Secondary | ICD-10-CM

## 2019-10-26 ENCOUNTER — Other Ambulatory Visit: Payer: Self-pay | Admitting: Internal Medicine

## 2019-11-29 ENCOUNTER — Other Ambulatory Visit: Payer: Self-pay | Admitting: Internal Medicine

## 2019-12-21 ENCOUNTER — Ambulatory Visit (INDEPENDENT_AMBULATORY_CARE_PROVIDER_SITE_OTHER): Payer: Medicare Other

## 2019-12-21 ENCOUNTER — Other Ambulatory Visit: Payer: Self-pay

## 2019-12-21 VITALS — Ht 64.0 in | Wt 123.0 lb

## 2019-12-21 DIAGNOSIS — Z0000 Encounter for general adult medical examination without abnormal findings: Secondary | ICD-10-CM | POA: Diagnosis not present

## 2019-12-21 NOTE — Patient Instructions (Addendum)
  Taylor [name] , Thank you for taking time to come for your Medicare Wellness Visit. I appreciate your ongoing commitment to your health goals. Please review the following plan we discussed and let me know if I can assist you in the future.   These are the goals we discussed: Goals      Patient Stated   . Follow up with Primary Care Provider (pt-stated)     As needed    . I want to walk and bike more for exercise (pt-stated)       This is a list of the screening recommended for you and due dates:  Health Maintenance  Topic Date Due  . Mammogram  03/10/2020  . Colon Cancer Screening  03/03/2023  . Tetanus Vaccine  04/28/2023  . Flu Shot  Completed  . DEXA scan (bone density measurement)  Completed  .  Hepatitis C: One time screening is recommended by Center for Disease Control  (CDC) for  adults born from [birth year] through 1965.   Completed  . Pneumonia vaccines  Completed

## 2019-12-21 NOTE — Progress Notes (Addendum)
Subjective:   Taylor [name] is a 69 y.o. female who presents for an Initial Medicare Annual Wellness Visit.  Review of Systems    No ROS.  Medicare Wellness Virtual Visit.  Visual/audio telehealth visit, UTA vital signs.   Ht/Wt provided. See social history for additional risk factors.  Cardiac Risk Factors include: advanced age (>30men, >75 women)     Objective:    Today's Vitals   12/21/19 1104  Weight: 123 lb (55.8 kg)  Height: 5\' 4"  (1.626 m)   Body mass index is 21.11 kg/m.  Advanced Directives 12/21/2019  Does Patient Have a Medical Advance Directive? Yes  Type of 02/20/2020 of [place];Living will  Does patient want to make changes to medical advance directive? No - Patient declined  Copy of Healthcare Power of Attorney in Chart? No - copy requested    Current Medications (verified) Outpatient Encounter Medications as of 12/21/2019  Medication Sig  . Calcium-Vitamin D-Vitamin K (VIACTIV PO) Take as directed  . cholestyramine (QUESTRAN) 4 GM/DOSE powder USE 1 SCOOPFUL THREE TIMES DAILY WITH MEALS  . fluticasone (FLONASE) 50 MCG/ACT nasal spray USE 2 SPRAYS IN EACH NOSTRIL EVERY DAY  . rosuvastatin (CRESTOR) 10 MG tablet TAKE ONE TABLET AT BEDTIME  . timolol (TIMOPTIC) 0.5 % ophthalmic solution   . VITAMIN D, CHOLECALCIFEROL, PO Take one by mouth daily   No facility-administered encounter medications on file as of 12/21/2019.    Allergies (verified) Vancomycin and Amoxicillin-pot clavulanate   History: Past Medical History:  Diagnosis Date  . Chest pain   . Fever blister   . Hyperlipidemia   . Irritable bowel syndrome   . Mitral valve prolapse   . Normal cardiac stress test 2007  . Osteoporosis   . Pill esophagitis 10/23/2012   Secondary to alendronate. Workup included chest CT for PE given recent travel to beach,  Symptoms  resolved.  Prior EGD from 12/21/2012 was already positive for esophgatis.     Past Surgical History:  Procedure  Laterality Date  . CHOLECYSTECTOMY  Approx - 2002   Family History  Problem Relation Age of Onset  . Heart disease Father 72       massive AMI, now post CABG  . Heart disease Brother 57       CABG  . Multiple sclerosis Mother   . Aortic aneurysm Paternal Grandfather        died of rupture  . Breast cancer Neg Hx    Social History   Socioeconomic History  . Marital status: Married    Spouse name: Not on file  . Number of children: Not on file  . Years of education: Not on file  . Highest education level: Not on file  Occupational History  . Not on file  Tobacco Use  . Smoking status: Never Smoker  . Smokeless tobacco: Never Used  Substance and Sexual Activity  . Alcohol use: Yes    Alcohol/week: 3.0 standard drinks    Types: 3 Glasses of wine per week  . Drug use: No  . Sexual activity: Not on file  Other Topics Concern  . Not on file  Social History Narrative  . Not on file   Social Determinants of Health   Financial Resource Strain:   . Difficulty of Paying Living Expenses: Not on file  Food Insecurity:   . Worried About 57 in the Last Year: Not on file  . Ran Out of Food in the Last  Year: Not on file  Transportation Needs:   . Lack of Transportation (Medical): Not on file  . Lack of Transportation (Non-Medical): Not on file  Physical Activity:   . Days of Exercise per Week: Not on file  . Minutes of Exercise per Session: Not on file  Stress:   . Feeling of Stress : Not on file  Social Connections:   . Frequency of Communication with Friends and Family: Not on file  . Frequency of Social Gatherings with Friends and Family: Not on file  . Attends Religious Services: Not on file  . Active Member of Clubs or Organizations: Not on file  . Attends Banker Meetings: Not on file  . Marital Status: Not on file    Tobacco Counseling Counseling given: Not Answered   Clinical Intake:  Pre-visit preparation completed: Yes         Diabetes: No  How often do you need to have someone help you when you read instructions, pamphlets, or other written materials from your doctor or pharmacy?: 1 - Never  Interpreter Needed?: No      Activities of Daily Living In your present state of health, do you have any difficulty performing the following activities: 12/21/2019  Hearing? N  Vision? N  Difficulty concentrating or making decisions? N  Walking or climbing stairs? N  Dressing or bathing? N  Doing errands, shopping? N  Preparing Food and eating ? N  Using the Toilet? N  In the past six months, have you accidently leaked urine? N  Do you have problems with loss of bowel control? N  Managing your Medications? N  Managing your Finances? N  Housekeeping or managing your Housekeeping? N  Some recent data might be hidden     Immunizations and Health Maintenance Immunization History  Administered Date(s) Administered  . Fluad Quad(high Dose 65+) 06/14/2019  . Influenza Split 10/24/2011  . Influenza, High Dose Seasonal PF 08/05/2018  . Influenza-Unspecified 07/13/2016  . PFIZER SARS-COV-2 Vaccination 10/18/2019, 11/08/2019  . Pneumococcal Conjugate-13 05/02/2016  . Pneumococcal Polysaccharide-23 04/27/2012, 06/03/2018  . Tdap 04/27/2013  . Zoster 04/27/2013   There are no preventive care reminders to display for this patient.  Patient Care Team: [name], MD as PCP - General (Internal Medicine)  Indicate any recent Medical Services you may have received from other than [facility] providers in the past year (date may be approximate).     Assessment:   This is a routine wellness examination for [employer].  Nurse connected with patient 12/21/19 at 11:00 AM EST by a telephone enabled telemedicine application and verified that I am speaking with the correct person using two identifiers. Patient stated full name and DOB. Patient gave permission to continue with virtual visit. Patient's location was at home and  Nurse's location was at [place] office.   Patient is alert and oriented x3. Patient denies difficulty focusing or concentrating. Patient likes to read for brain stimulation.   Health Maintenance Due: -Mammogram- Plans to schedule  See completed HM at the end of note.   Eye: Visual acuity not assessed. Virtual visit. Followed by their ophthalmologist.  Dental: Visits every 6 months.    Hearing: Demonstrates normal hearing during visit.  Safety:  Patient feels safe at home- yes Patient does have smoke detectors at home- yes Patient does wear sunscreen or protective clothing when in direct sunlight - yes Patient does wear seat belt when in a moving vehicle - yes Patient drives- yes Adequate lighting in  walkways free from debris- yes Grab bars and handrails used as appropriate- yes Ambulates with an assistive device- no   Social: Alcohol intake - yes      Smoking history- never Smokers in home? none Illicit drug use? none  Medication: Taking as directed and without issues.  Self managed - yes   Covid-19: Precautions and sickness symptoms discussed. Wears mask, social distancing, hand hygiene as appropriate.   Activities of Daily Living Patient denies needing assistance with: household chores, feeding themselves, getting from bed to chair, getting to the toilet, bathing/showering, dressing, managing money, or preparing meals.   Discussed the importance of a healthy diet, water intake and the benefits of aerobic exercise.   Physical activity- walking  Diet:  Regular Water: 32 ounces Caffeine: none  Other Providers Patient Care Team: Crecencio Mc, MD as PCP - General (Internal Medicine) Hearing/Vision screen  Hearing Screening   125Hz  250Hz  500Hz  1000Hz  2000Hz  3000Hz  4000Hz  6000Hz  8000Hz   Right ear:           Left ear:           Comments: Patient is able to hear conversational tones without difficulty.  No issues reported.  Vision Screening Comments: Wears  corrective lenses Cataract extraction, bilateral Visual acuity not assessed, virtual visit.  They have seen their ophthalmologist in the last 12 months.   Dietary issues and exercise activities discussed: Current Exercise Habits: Home exercise routine, Type of exercise: walking, Intensity: Mild  Goals      Patient Stated   . Follow up with Primary Care Provider (pt-stated)     As needed    . I want to walk and bike more for exercise (pt-stated)      Depression Screen PHQ 2/9 Scores 12/21/2019 06/03/2018 05/28/2017 10/23/2012  PHQ - 2 Score 0 2 0 0  PHQ- 9 Score - 5 3 -    Fall Risk Fall Risk  12/21/2019 06/14/2019 06/03/2018 05/28/2017  Falls in the past year? 0 0 No No  Follow up Falls evaluation completed Falls evaluation completed - -   Timed Get Up and Go Performed no, virtual visit  Cognitive Function:     6CIT Screen 12/21/2019  What Year? 0 points  What month? 0 points  What time? 0 points  Count back from 20 0 points  Months in reverse 0 points  Repeat phrase 0 points  Total Score 0    Screening Tests Health Maintenance  Topic Date Due  . MAMMOGRAM  03/10/2020  . COLONOSCOPY  03/03/2023  . TETANUS/TDAP  04/28/2023  . INFLUENZA VACCINE  Completed  . DEXA SCAN  Completed  . Hepatitis C Screening  Completed  . PNA vac Low Risk Adult  Completed       Plan:   Keep all routine maintenance appointments.   [facility] 06/15/20 @ 9:30  Patient plans to schedule annual Mammogram.  Medicare Attestation I have personally reviewed: The patient's medical and social history Their use of alcohol, tobacco or illicit drugs Their current medications and supplements The patient's functional ability including ADLs,fall risks, home safety risks, cognitive, and hearing and visual impairment Diet and physical activities Evidence for depression   I have reviewed and discussed with patient certain preventive protocols, quality metrics, and best practice recommendations.       OBrien-Blaney, Mckenzie Toruno L, LPN   0/10/930     I have reviewed the above information and agree with above.   Deborra Medina, MD

## 2020-01-04 ENCOUNTER — Other Ambulatory Visit: Payer: Self-pay

## 2020-01-04 DIAGNOSIS — Z1159 Encounter for screening for other viral diseases: Secondary | ICD-10-CM

## 2020-01-04 DIAGNOSIS — N644 Mastodynia: Secondary | ICD-10-CM

## 2020-01-04 MED ORDER — ROSUVASTATIN CALCIUM 10 MG PO TABS: 10.0000 mg | ORAL_TABLET | Freq: Every day | ORAL | 1 refills | Status: DC

## 2020-01-26 ENCOUNTER — Other Ambulatory Visit: Payer: Self-pay | Admitting: Internal Medicine

## 2020-01-26 DIAGNOSIS — N63 Unspecified lump in unspecified breast: Secondary | ICD-10-CM

## 2020-01-26 DIAGNOSIS — N644 Mastodynia: Secondary | ICD-10-CM

## 2020-02-03 ENCOUNTER — Telehealth: Payer: Self-pay | Admitting: Internal Medicine

## 2020-02-03 MED ORDER — CHOLESTYRAMINE 4 GM/DOSE PO POWD: ORAL | 0 refills | Status: DC

## 2020-02-03 NOTE — Addendum Note (Signed)
Addended byElise Benne T on: 02/03/2020 01:25 PM   Modules accepted: Orders

## 2020-02-03 NOTE — Telephone Encounter (Signed)
Pt needs a refill on cholestyramine (QUESTRAN) 4 GM/DOSE powder sent to [facility]

## 2020-02-04 ENCOUNTER — Ambulatory Visit
Admission: RE | Admit: 2020-02-04 | Discharge: 2020-02-04 | Disposition: A | Discharge status: Home / Self Care | Payer: Medicare Other | Source: Ambulatory Visit | Attending: Internal Medicine | Admitting: Internal Medicine

## 2020-02-04 DIAGNOSIS — N63 Unspecified lump in unspecified breast: Secondary | ICD-10-CM

## 2020-02-04 DIAGNOSIS — N644 Mastodynia: Secondary | ICD-10-CM

## 2020-02-29 ENCOUNTER — Other Ambulatory Visit: Payer: Self-pay

## 2020-02-29 MED ORDER — CHOLESTYRAMINE 4 GM/DOSE PO POWD: ORAL | 0 refills | Status: DC

## 2020-06-14 ENCOUNTER — Other Ambulatory Visit: Payer: Self-pay | Admitting: Internal Medicine

## 2020-06-15 ENCOUNTER — Encounter: Payer: Self-pay | Admitting: Internal Medicine

## 2020-06-15 ENCOUNTER — Ambulatory Visit (INDEPENDENT_AMBULATORY_CARE_PROVIDER_SITE_OTHER): Payer: Medicare Other | Admitting: Internal Medicine

## 2020-06-15 ENCOUNTER — Other Ambulatory Visit: Payer: Self-pay

## 2020-06-15 VITALS — BP 114/80 | HR 92 | Temp 98.5°F | Resp 16 | Ht 64.0 in | Wt 121.8 lb

## 2020-06-15 DIAGNOSIS — S81002A Unspecified open wound, left knee, initial encounter: Secondary | ICD-10-CM

## 2020-06-15 DIAGNOSIS — Z0184 Encounter for antibody response examination: Secondary | ICD-10-CM

## 2020-06-15 DIAGNOSIS — Z0000 Encounter for general adult medical examination without abnormal findings: Secondary | ICD-10-CM

## 2020-06-15 DIAGNOSIS — E538 Deficiency of other specified B group vitamins: Secondary | ICD-10-CM | POA: Diagnosis not present

## 2020-06-15 DIAGNOSIS — E559 Vitamin D deficiency, unspecified: Secondary | ICD-10-CM

## 2020-06-15 DIAGNOSIS — R5383 Other fatigue: Secondary | ICD-10-CM | POA: Diagnosis not present

## 2020-06-15 DIAGNOSIS — K219 Gastro-esophageal reflux disease without esophagitis: Secondary | ICD-10-CM

## 2020-06-15 DIAGNOSIS — E785 Hyperlipidemia, unspecified: Secondary | ICD-10-CM

## 2020-06-15 DIAGNOSIS — I77810 Thoracic aortic ectasia: Secondary | ICD-10-CM

## 2020-06-15 DIAGNOSIS — M810 Age-related osteoporosis without current pathological fracture: Secondary | ICD-10-CM

## 2020-06-15 LAB — COMPREHENSIVE METABOLIC PANEL
ALT: 12 U/L (ref 0–35)
AST: 16 U/L (ref 0–37)
Albumin: 4.3 g/dL (ref 3.5–5.2)
Alkaline Phosphatase: 84 U/L (ref 39–117)
BUN: 17 mg/dL (ref 6–23)
CO2: 27 mEq/L (ref 19–32)
Calcium: 10.1 mg/dL (ref 8.4–10.5)
Chloride: 104 mEq/L (ref 96–112)
Creatinine, Ser: 0.91 mg/dL (ref 0.40–1.20)
GFR: 61.26 mL/min (ref >60.00)
Glucose, Bld: 87 mg/dL (ref 70–99)
Potassium: 4.3 mEq/L (ref 3.5–5.1)
Sodium: 139 mEq/L (ref 135–145)
Total Bilirubin: 0.6 mg/dL (ref 0.2–1.2)
Total Protein: 7.2 g/dL (ref 6.0–8.3)

## 2020-06-15 LAB — CBC WITH DIFFERENTIAL/PLATELET
Basophils Absolute: 0 10*3/uL (ref 0.0–0.1)
Basophils Relative: 0.5 % (ref 0.0–3.0)
Eosinophils Absolute: 0.1 10*3/uL (ref 0.0–0.7)
Eosinophils Relative: 1.8 % (ref 0.0–5.0)
HCT: 40 % (ref 36.0–46.0)
Hemoglobin: 13.6 g/dL (ref 12.0–15.0)
Lymphocytes Relative: 30.5 % (ref 12.0–46.0)
Lymphs Abs: 2.1 10*3/uL (ref 0.7–4.0)
MCHC: 34.1 g/dL (ref 30.0–36.0)
MCV: 93.6 fl (ref 78.0–100.0)
Monocytes Absolute: 0.7 10*3/uL (ref 0.1–1.0)
Monocytes Relative: 10.5 % (ref 3.0–12.0)
Neutro Abs: 3.9 10*3/uL (ref 1.4–7.7)
Neutrophils Relative %: 56.7 % (ref 43.0–77.0)
Platelets: 276 10*3/uL (ref 150.0–400.0)
RBC: 4.27 Mil/uL (ref 3.87–5.11)
RDW: 13.4 % (ref 11.5–15.5)
WBC: 6.9 10*3/uL (ref 4.0–10.5)

## 2020-06-15 LAB — LIPID PANEL
Cholesterol: 188 mg/dL (ref 0–200)
HDL: 75.8 mg/dL (ref >39.00)
LDL Cholesterol: 89 mg/dL (ref 0–99)
NonHDL: 111.75
Total CHOL/HDL Ratio: 2
Triglycerides: 114 mg/dL (ref 0.0–149.0)
VLDL: 22.8 mg/dL (ref 0.0–40.0)

## 2020-06-15 LAB — TSH: TSH: 2.25 u[IU]/mL (ref 0.35–4.50)

## 2020-06-15 LAB — VITAMIN B12: Vitamin B-12: 971 pg/mL — ABNORMAL HIGH (ref 211–911)

## 2020-06-15 LAB — VITAMIN D 25 HYDROXY (VIT D DEFICIENCY, FRACTURES): VITD: 34.89 ng/mL (ref 30.00–100.00)

## 2020-06-15 MED ORDER — FAMOTIDINE 20 MG PO TABS: 20.0000 mg | ORAL_TABLET | Freq: Every day | ORAL | 1 refills | Status: DC

## 2020-06-15 MED ORDER — PANTOPRAZOLE SODIUM 40 MG PO TBEC: 40.0000 mg | DELAYED_RELEASE_TABLET | Freq: Every day | ORAL | 3 refills | Status: DC

## 2020-06-15 NOTE — Patient Instructions (Addendum)
We are checking your antibody level to determine when you need the booster  Flu shot around October 1  For your gastritis:  Adding protonix in the morning.Marland Kitchen ok to add 20 mg famotidine before dinner   Try taking 2 Beano at every meal contain vegetables  You need a minimum of 60 ounces of water daily   See your dermatologist for a skin biopsy of the nodules in your breast; they ar ein the skin not the breast  We will repeat your CTA in September to monitor your aortic dilatation  We will repeat your DEXA in 2022

## 2020-06-15 NOTE — Progress Notes (Signed)
Patient ID: Taylor [name], female    DOB: 1951-03-06  Age: 69 y.o. MRN: [id number]  The patient is here for annual follow up and management of other chronic and acute problems.   The risk factors are reflected in the social history.  There are no preventive care reminders to display for this patient.  The roster of all physicians providing medical care to patient - is listed in the Snapshot section of the chart.  Activities of daily living:  The patient is 100% independent in all ADLs: dressing, toileting, feeding as well as independent mobility  Home safety : The patient has smoke detectors in the home. They wear seatbelts.  There are no firearms at home. There is no violence in the home.   There is no risks for hepatitis, STDs or HIV. There is no   history of blood transfusion. They have no travel history to infectious disease endemic areas of the world.  The patient has seen their dentist in the last six month. They have seen their eye doctor in the last year. They admit to slight hearing difficulty with regard to whispered voices and some television programs.  They have deferred audiologic testing in the last year.  They do not  have excessive sun exposure. Discussed the need for sun protection: hats, long sleeves and use of sunscreen if there is significant sun exposure.   Diet: the importance of a healthy diet is discussed. They do have a healthy diet.  The benefits of regular aerobic exercise were discussed. She walks 4 times per week ,  20 minutes.   Depression screen: there are no signs or vegative symptoms of depression- irritability, change in appetite, anhedonia, sadness/tearfullness.  Cognitive assessment: the patient manages all their financial and personal affairs and is actively engaged. They could relate day,date,year and events; recalled 2/3 objects at 3 minutes; performed clock-face test normally.  The following portions of the patient's history were reviewed and updated as  appropriate: allergies, current medications, past family history, past medical history,  past surgical history, past social history  and problem list.  Visual acuity was not assessed per patient preference since she has regular follow up with her ophthalmologist. Hearing and body mass index were assessed and reviewed.   During the course of the visit the patient was educated and counseled about appropriate screening and preventive services including : fall prevention , diabetes screening, nutrition counseling, colorectal cancer screening, and recommended immunizations.    CC: The primary encounter diagnosis was B12 deficiency. Diagnoses of Hyperlipidemia LDL goal <160, Vitamin D deficiency, Fatigue, unspecified type, COVID-19 virus IgG antibody test result equivocal, Open wound of left knee, initial encounter, Aortic root dilatation (HCC), Gastroesophageal reflux disease without esophagitis, Routine adult health maintenance, and Osteoporosis without current pathological fracture, unspecified osteoporosis type were also pertinent to this visit.  1) reviewed diagnostic mammogram in April and the offered diagnosis of subcutaneous granules offered by radiology  2) nonhealing wound of left patella,  Recommended referral to wound center for management including debridement of edges of wound   3) recurrence of dyspepsia.  Waking her up at night  Aggravated by empty stomach.  Not taking any PPIs or H2 blockers.  4) brother died veryrecently of acute respiratory failure  Recently.  No warning.  Diagnosed with pulmonary fibrosis made several years ago but he was physically fit , exercised daily. No autopsy was  Done.     5) playing pickle ball 3 times per week and walking on  the beach several days per week.  Has occasional Left sided lateral back pain when she rolls over in bed   [name] has a past medical history of Chest pain, Fever blister, Hyperlipidemia, Irritable bowel syndrome, Mitral valve  prolapse, Normal cardiac stress test (2007), Osteoporosis, and Pill esophagitis (10/23/2012).   She has a past surgical history that includes Cholecystectomy (Approx - 2002) and Cataract extraction (Left, 07/2019).   Her family history includes Aortic aneurysm in her paternal grandfather; Heart disease (age of onset: 34) in her father; Heart disease (age of onset: 53) in her brother; Multiple sclerosis in her mother.She reports that she has never smoked. She has never used smokeless tobacco. She reports current alcohol use of about 3.0 standard drinks of alcohol per week. She reports that she does not use drugs.  Outpatient Medications Prior to Visit  Medication Sig Dispense Refill   Calcium-Vitamin D-Vitamin K (VIACTIV PO) Take as directed     cholestyramine (QUESTRAN) 4 GM/DOSE powder USE 1 SCOOPFUL 3 TIMES DAILY WITH MEALS (Patient taking differently: Take by mouth. USE 1 SCOOPFUL 3 TIMES DAILY WITH MEALS) 378 g 0   fluticasone (FLONASE) 50 MCG/ACT nasal spray USE 2 SPRAYS IN EACH NOSTRIL EVERY DAY 16 g 2   rosuvastatin (CRESTOR) 10 MG tablet Take 1 tablet (10 mg total) by mouth at bedtime. (Patient taking differently: Take 10 mg by mouth at bedtime. ) 90 tablet 1   timolol (TIMOPTIC) 0.5 % ophthalmic solution      vitamin B-12 (CYANOCOBALAMIN) 1000 MCG tablet Take 1,000 mcg by mouth daily.     VITAMIN D, CHOLECALCIFEROL, PO Take one by mouth daily     No facility-administered medications prior to visit.    Review of Systems   Patient denies headache, fevers, malaise, unintentional weight loss, skin rash, eye pain, sinus congestion and sinus pain, sore throat, dysphagia,  hemoptysis , cough, dyspnea, wheezing, chest pain, palpitations, orthopnea, edema, abdominal pain, nausea, melena, diarrhea, constipation, flank pain, dysuria, hematuria, urinary  Frequency, nocturia, numbness, tingling, seizures,  Focal weakness, Loss of consciousness,  Tremor, insomnia, depression, anxiety, and  suicidal ideation.     Objective:  BP 114/80 (BP Location: Left Arm, Patient Position: Sitting, Cuff Size: Normal)    Pulse 92    Temp 98.5 F (36.9 C) (Oral)    Resp 16    Ht 5\' 4"  (1.626 m)    Wt 121 lb 12.8 oz (55.2 kg)    SpO2 96%    BMI 20.91 kg/m   Physical Exam  General appearance: alert, cooperative and appears stated age Ears: normal TM's and external ear canals both ears Throat: lips, mucosa, and tongue normal; teeth and gums normal Neck: no adenopathy, no carotid bruit, supple, symmetrical, trachea midline and thyroid not enlarged, symmetric, no tenderness/mass/nodules Back: symmetric, no curvature. ROM normal. No CVA tenderness. Lungs: clear to auscultation bilaterally Heart: regular rate and rhythm, S1, S2 normal, no murmur, click, rub or gallop Abdomen: soft, non-tender; bowel sounds normal; no masses,  no organomegaly Pulses: 2+ and symmetric Skin: left patella with small elliptical wound  Inferior edge has epithelialized. Skin color, texture, turgor normal. No rashes or lesions Lymph nodes: Cervical, supraclavicular, and axillary nodes normal.  Assessment & Plan:   Problem List Items Addressed This Visit      Unprioritized   Aortic root dilatation [facility])    Annual imaging is due.  CTA ordered       Relevant Orders   CT Angio Chest W/Cm &/Or  Wo Cm   B12 deficiency - Primary   Relevant Orders   Vitamin B12 (Completed)   COVID-19 virus IgG antibody test result equivocal    Discussed her concern about  needing  booster vaccine       Relevant Orders   SARS-CoV-2 Semi-Quantitative Total Antibody, Spike   Fatigue   Relevant Orders   Comprehensive metabolic panel (Completed)   TSH (Completed)   CBC with Differential/Platelet (Completed)   GERD (gastroesophageal reflux disease)    Advised to resume pantoprazole in the am and famotidine in the evening       Relevant Medications   pantoprazole (PROTONIX) 40 MG tablet   famotidine (PEPCID) 20 MG tablet    Hyperlipidemia LDL goal <160    LDL and triglycerides are at goal on current medications. shehas no side effects and liver enzymes are normal. No changes today   Lab Results  Component Value Date   CHOL 188 06/15/2020   HDL 75.80 06/15/2020   LDLCALC 89 06/15/2020   LDLDIRECT 122.6 04/23/2013   TRIG 114.0 06/15/2020   CHOLHDL 2 06/15/2020   Lab Results  Component Value Date   ALT 12 06/15/2020   AST 16 06/15/2020   ALKPHOS 84 06/15/2020   BILITOT 0.6 06/15/2020         Relevant Orders   Lipid panel (Completed)   Open wound of left knee    The wound is small but will not close because the inferior edge has epithelialized.  Referral to wound centre for debridement      Relevant Orders   Ambulatory referral to Pain Clinic   Osteoporosis    Managed with [facility].  Repeat DEXA in 2020 shows improvement.  Repeat in 2022       Routine adult health maintenance    age appropriate education and counseling updated, referrals for preventative services and immunizations addressed, dietary and smoking counseling addressed, most recent labs reviewed.  I have personally reviewed and have noted:  1) the patient's medical and social history 2) The pt's use of alcohol, tobacco, and illicit drugs 3) The patient's current medications and supplements 4) Functional ability including ADL's, fall risk, home safety risk, hearing and visual impairment 5) Diet and physical activities 6) Evidence for depression or mood disorder 7) The patient's height, weight, and BMI have been recorded in the chart  I have made referrals, and provided counseling and education based on review of the above       Other Visit Diagnoses    Vitamin D deficiency       Relevant Orders   VITAMIN D 25 Hydroxy (Vit-D Deficiency, Fractures) (Completed)     A total of 40 minutes was spent with patient more than half of which was spent in counseling patient on the above mentioned issues , reviewing and explaining  recent labs and imaging studies done, and coordination of care.  I am having Taylor [name] start on pantoprazole and famotidine. I am also having her maintain her (VITAMIN D, CHOLECALCIFEROL, PO), Calcium-Vitamin D-Vitamin K (VIACTIV PO), fluticasone, timolol, rosuvastatin, cholestyramine, and vitamin B-12.  Meds ordered this encounter  Medications   pantoprazole (PROTONIX) 40 MG tablet    Sig: Take 1 tablet (40 mg total) by mouth daily.    Dispense:  30 tablet    Refill:  3   famotidine (PEPCID) 20 MG tablet    Sig: Take 1 tablet (20 mg total) by mouth daily. Before dinner meal    Dispense:  90 tablet  Refill:  1    There are no discontinued medications.  Follow-up: No follow-ups on file.   Crecencio Mc, MD

## 2020-06-17 DIAGNOSIS — S81002A Unspecified open wound, left knee, initial encounter: Secondary | ICD-10-CM | POA: Insufficient documentation

## 2020-06-17 DIAGNOSIS — Z0184 Encounter for antibody response examination: Secondary | ICD-10-CM | POA: Insufficient documentation

## 2020-06-17 DIAGNOSIS — R5383 Other fatigue: Secondary | ICD-10-CM | POA: Insufficient documentation

## 2020-06-17 NOTE — Assessment & Plan Note (Signed)
The wound is small but will not close because the inferior edge has epithelialized.  Referral to wound centre for debridement

## 2020-06-17 NOTE — Assessment & Plan Note (Signed)
LDL and triglycerides are at goal on current medications. shehas no side effects and liver enzymes are normal. No changes today   Lab Results  Component Value Date   CHOL 188 06/15/2020   HDL 75.80 06/15/2020   LDLCALC 89 06/15/2020   LDLDIRECT 122.6 04/23/2013   TRIG 114.0 06/15/2020   CHOLHDL 2 06/15/2020   Lab Results  Component Value Date   ALT 12 06/15/2020   AST 16 06/15/2020   ALKPHOS 84 06/15/2020   BILITOT 0.6 06/15/2020

## 2020-06-17 NOTE — Assessment & Plan Note (Signed)

## 2020-06-17 NOTE — Assessment & Plan Note (Signed)
Advised to resume pantoprazole in the am and famotidine in the evening

## 2020-06-17 NOTE — Assessment & Plan Note (Signed)
Annual imaging is due.  CTA ordered

## 2020-06-17 NOTE — Assessment & Plan Note (Signed)
Managed with [facility].  Repeat DEXA in 2020 shows improvement.  Repeat in 2022

## 2020-06-17 NOTE — Assessment & Plan Note (Signed)
Discussed her concern about  needing  booster vaccine

## 2020-06-20 LAB — SARS-COV-2 SEMI-QUANTITATIVE TOTAL ANTIBODY, SPIKE: SARS COV2 AB, Total Spike Semi QN: 369.8 U/mL — ABNORMAL HIGH (ref <0.8)

## 2020-06-21 ENCOUNTER — Other Ambulatory Visit: Payer: Self-pay

## 2020-06-21 ENCOUNTER — Ambulatory Visit
Admission: RE | Admit: 2020-06-21 | Discharge: 2020-06-21 | Disposition: A | Discharge status: Home / Self Care | Payer: Medicare Other | Source: Ambulatory Visit | Attending: Internal Medicine | Admitting: Internal Medicine

## 2020-06-21 ENCOUNTER — Other Ambulatory Visit: Payer: Self-pay | Admitting: Internal Medicine

## 2020-06-21 DIAGNOSIS — I77810 Thoracic aortic ectasia: Secondary | ICD-10-CM | POA: Insufficient documentation

## 2020-06-21 MED ORDER — IOHEXOL 350 MG/ML SOLN
100.0000 mL | Freq: Once | INTRAVENOUS | Status: AC | PRN
  Administered 2020-06-21: 75 mL via INTRAVENOUS

## 2020-07-11 ENCOUNTER — Other Ambulatory Visit: Payer: Self-pay

## 2020-07-11 ENCOUNTER — Encounter: Payer: Medicare Other | Admitting: Physician Assistant

## 2020-07-11 ENCOUNTER — Telehealth: Payer: Self-pay | Admitting: Internal Medicine

## 2020-07-11 NOTE — Telephone Encounter (Signed)
patient's wound was healed. PA Stone III looked and stated she did not need to be seen. Appt canceled." [facility] said on Jul 11, 2020 9:35 AM

## 2020-08-17 ENCOUNTER — Other Ambulatory Visit: Payer: Self-pay | Admitting: Internal Medicine

## 2020-08-29 IMAGING — DX DG CHEST 2V
2 series · 2 of 2 positions shown · non-contrast
Comparison: 06/11/2018

CLINICAL DATA: Left arm numbness

EXAM:
CHEST - 2 VIEW

[chest pa]
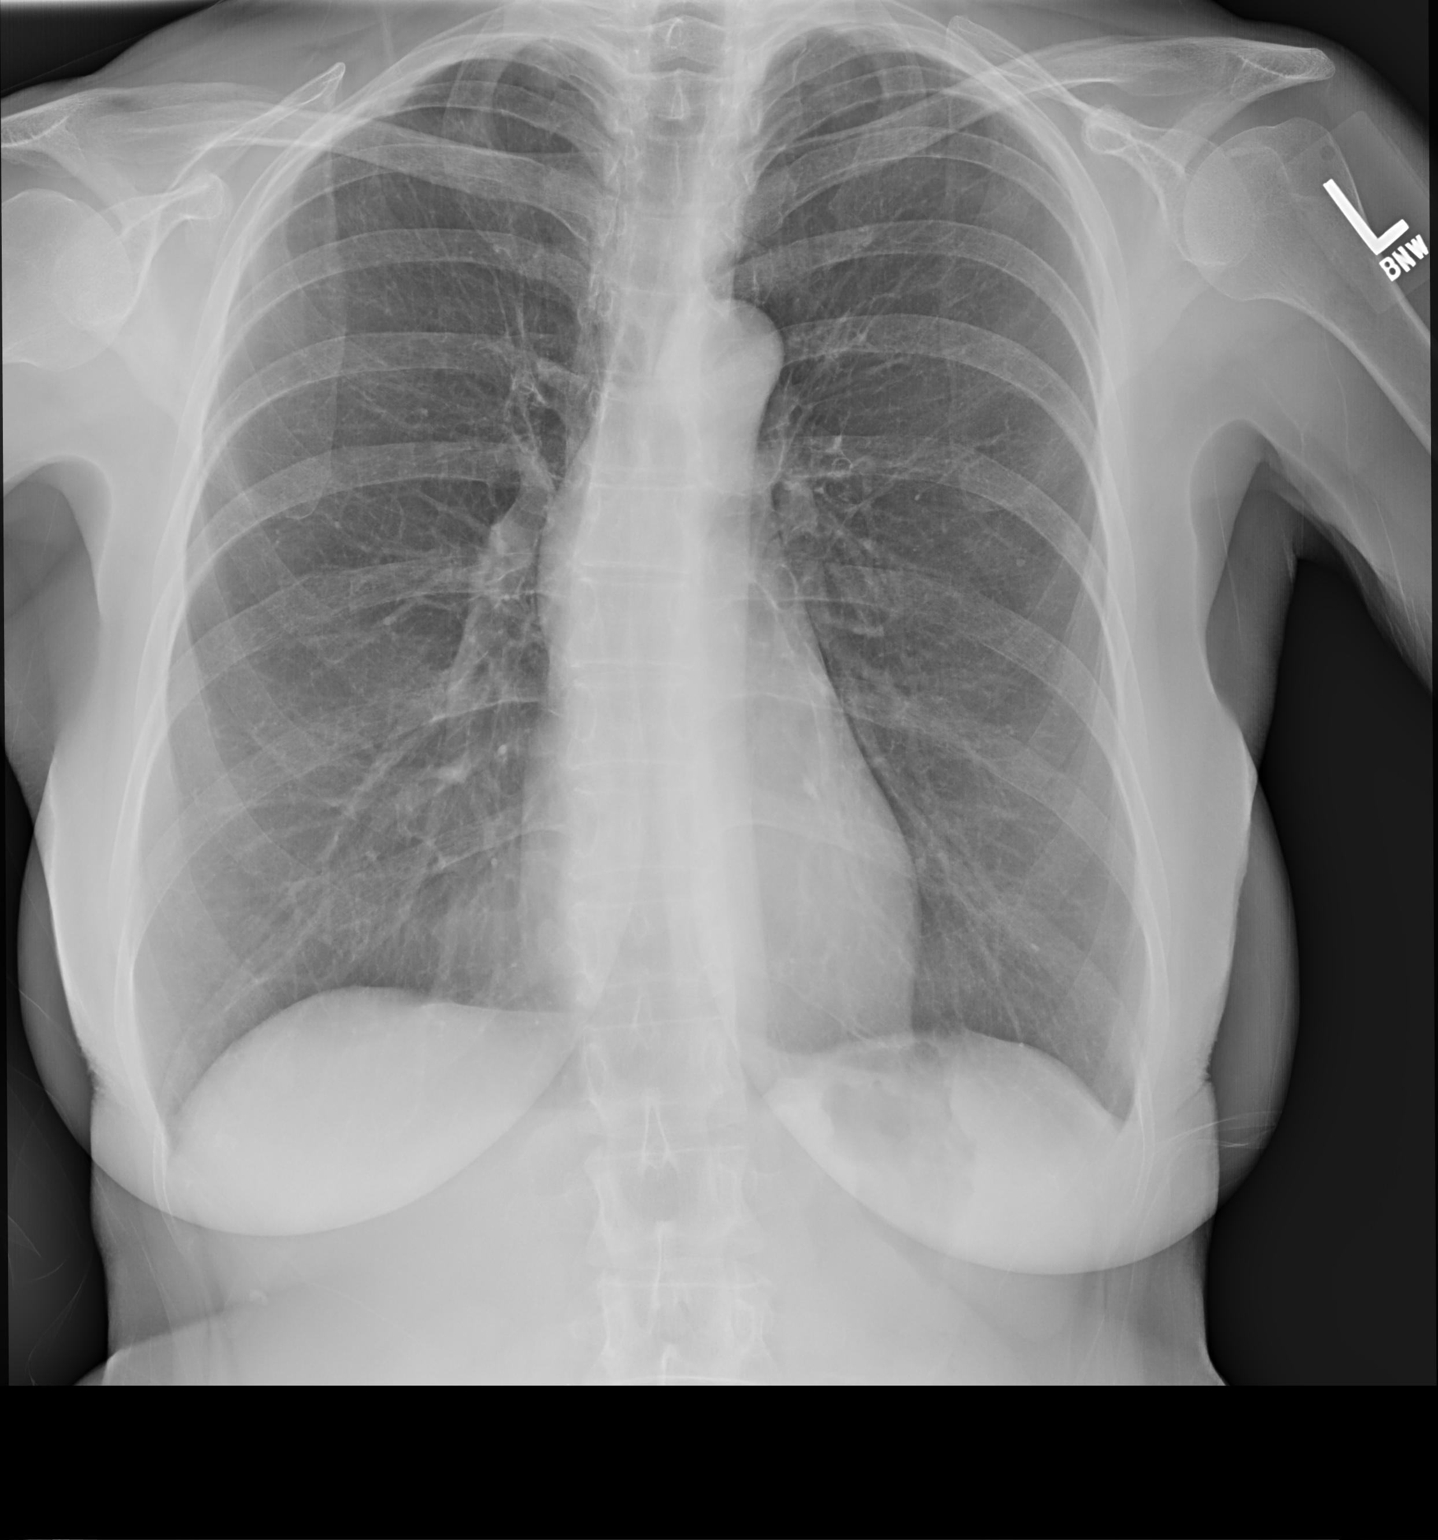

[chest lat]
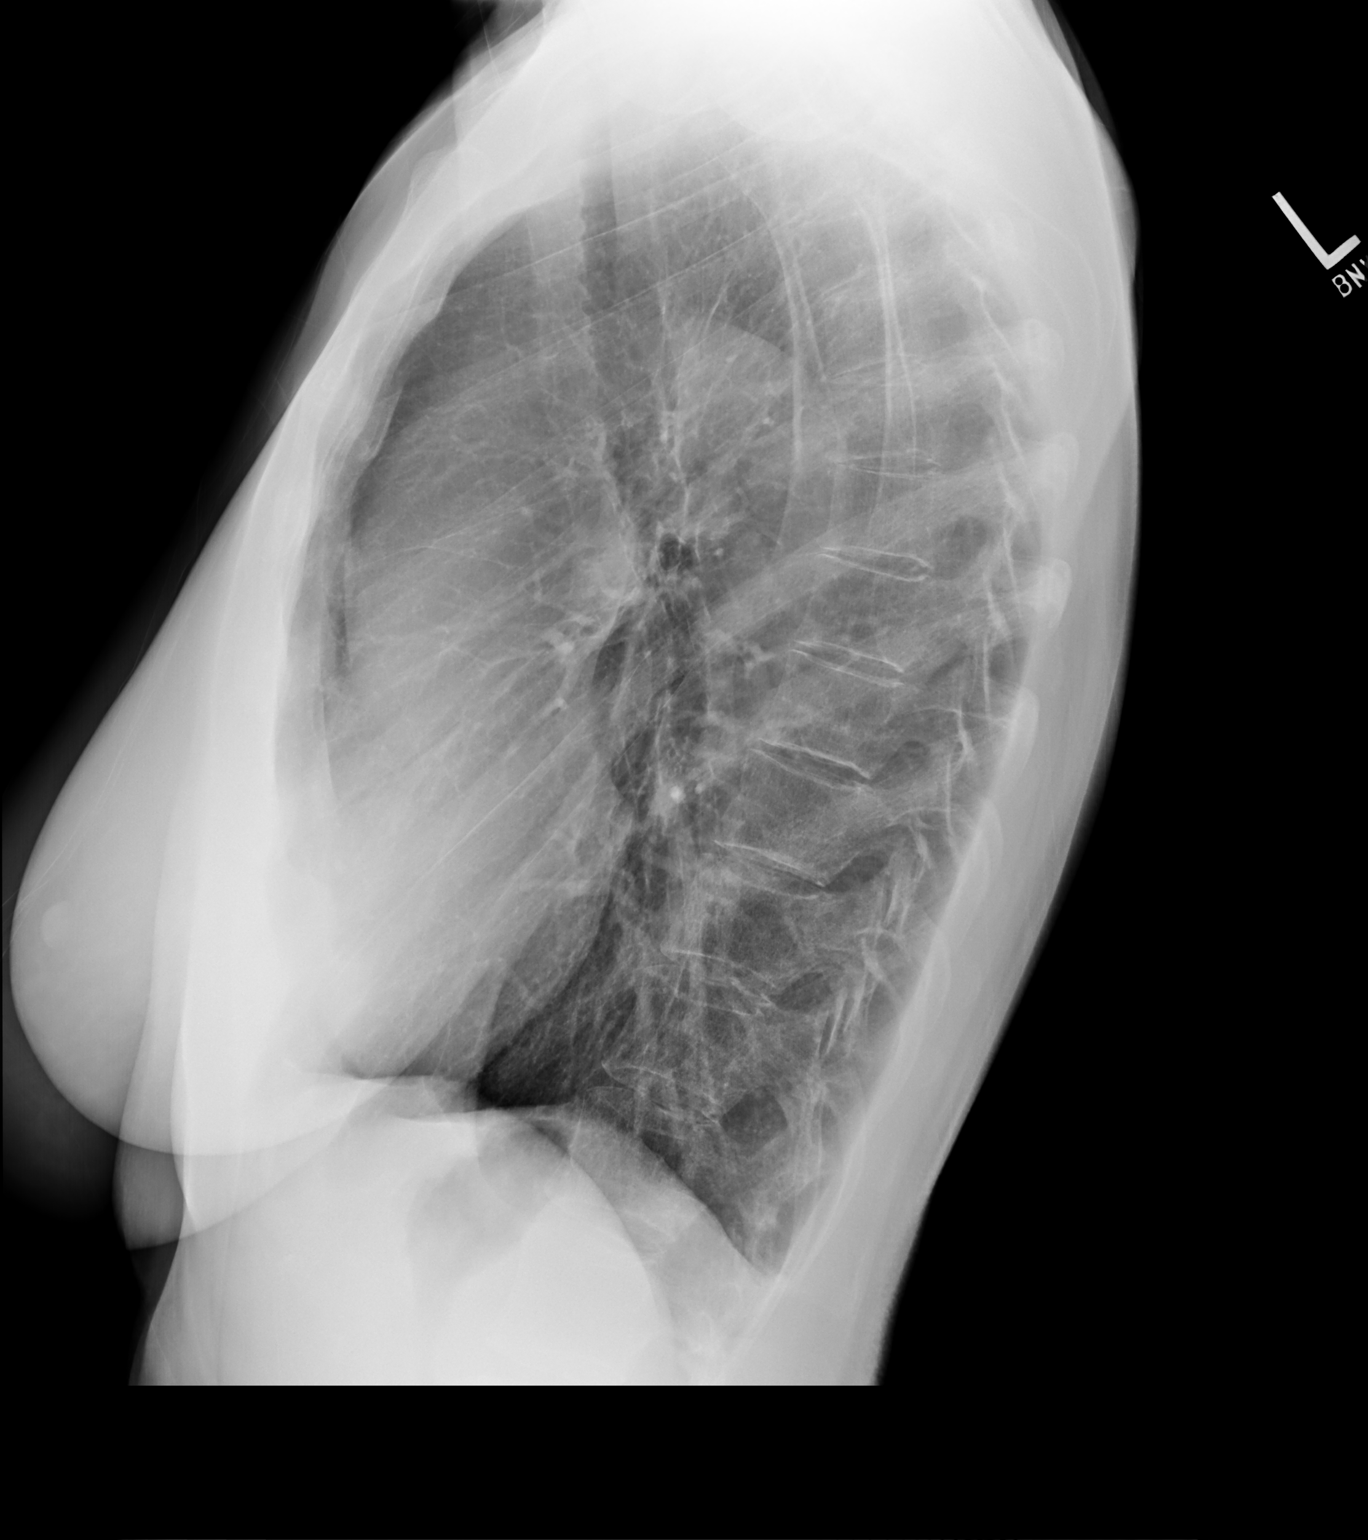

[2 of 2 positions shown; findings below may reference images not displayed]

FINDINGS: Cardiac shadow is stable. Prominent ascending aorta is again
identified and stable. The lungs are clear. No acute bony
abnormality is seen.
IMPRESSION: Stable dilatation of the ascending aorta similar to that seen on
prior CT examination.

No acute abnormality noted.

## 2020-09-18 ENCOUNTER — Other Ambulatory Visit: Payer: Self-pay

## 2020-09-18 MED ORDER — PANTOPRAZOLE SODIUM 40 MG PO TBEC: 40.0000 mg | DELAYED_RELEASE_TABLET | Freq: Every day | ORAL | 1 refills | Status: DC

## 2020-10-02 ENCOUNTER — Other Ambulatory Visit: Payer: Self-pay | Admitting: Internal Medicine

## 2020-10-03 MED ORDER — CHOLESTYRAMINE 4 GM/DOSE PO POWD: ORAL | 5 refills | Status: DC

## 2020-12-21 ENCOUNTER — Ambulatory Visit (INDEPENDENT_AMBULATORY_CARE_PROVIDER_SITE_OTHER): Payer: Medicare Other

## 2020-12-21 VITALS — Ht 64.0 in | Wt 121.0 lb

## 2020-12-21 DIAGNOSIS — Z0000 Encounter for general adult medical examination without abnormal findings: Secondary | ICD-10-CM | POA: Diagnosis not present

## 2020-12-21 DIAGNOSIS — Z1231 Encounter for screening mammogram for malignant neoplasm of breast: Secondary | ICD-10-CM | POA: Diagnosis not present

## 2020-12-21 NOTE — Progress Notes (Addendum)
Subjective:   Taylor [name] is a 70 y.o. female who presents for Medicare Annual (Subsequent) preventive examination.  Review of Systems    No ROS.  Medicare Wellness Virtual Visit.   Cardiac Risk Factors include: advanced age (>69men, >69 women);hypertension     Objective:    Today's Vitals   12/21/20 1005  Weight: 121 lb (54.9 kg)  Height: 5\' 4"  (1.626 m)   Body mass index is 20.77 kg/m.  Advanced Directives 12/21/2020 12/21/2019  Does Patient Have a Medical Advance Directive? No Yes  Type of Advance Directive - [place];Living will  Does patient want to make changes to medical advance directive? - No - Patient declined  Copy of [place] in Chart? - No - copy requested  Would patient like information on creating a medical advance directive? No - Patient declined -    Current Medications (verified) Outpatient Encounter Medications as of 12/21/2020  Medication Sig   Calcium-Vitamin D-Vitamin K (VIACTIV PO) Take as directed   cholestyramine (QUESTRAN) 4 GM/DOSE powder USE 1 SCOOPFUL 3 TIMES DAILY WITH MEALS   famotidine (PEPCID) 20 MG tablet Take 1 tablet (20 mg total) by mouth daily. Before dinner meal   fluticasone (FLONASE) 50 MCG/ACT nasal spray USE 2 SPRAYS IN EACH NOSTRIL EVERY DAY   pantoprazole (PROTONIX) 40 MG tablet Take 1 tablet (40 mg total) by mouth daily.   rosuvastatin (CRESTOR) 10 MG tablet Take 1 tablet (10 mg total) by mouth at bedtime. (Patient taking differently: Take 10 mg by mouth at bedtime.)   timolol (TIMOPTIC) 0.5 % ophthalmic solution    vitamin B-12 (CYANOCOBALAMIN) 1000 MCG tablet Take 1,000 mcg by mouth daily.   VITAMIN D, CHOLECALCIFEROL, PO Take one by mouth daily   No facility-administered encounter medications on file as of 12/21/2020.    Allergies (verified) Vancomycin and Amoxicillin-pot clavulanate   History: Past Medical History:  Diagnosis Date   Chest pain    Fever blister    Hyperlipidemia     Irritable bowel syndrome    Mitral valve prolapse    Normal cardiac stress test 2007   Osteoporosis    Pill esophagitis 10/23/2012   Secondary to alendronate. Workup included chest CT for PE given recent travel to beach,  Symptoms  resolved.  Prior EGD from [facility] was already positive for esophgatis.     Past Surgical History:  Procedure Laterality Date   CATARACT EXTRACTION Left 07/2019   CHOLECYSTECTOMY  Approx - 2002   Family History  Problem Relation Age of Onset   Heart disease Father 66       massive AMI, now post CABG   Heart disease Brother 42       CABG   Multiple sclerosis Mother    Aortic aneurysm Paternal Grandfather        died of rupture   Breast cancer Neg Hx    Social History   Socioeconomic History   Marital status: Married    Spouse name: Not on file   Number of children: Not on file   Years of education: Not on file   Highest education level: Not on file  Occupational History   Not on file  Tobacco Use   Smoking status: Never Smoker   Smokeless tobacco: Never Used  Substance and Sexual Activity   Alcohol use: Yes    Alcohol/week: 3.0 standard drinks    Types: 3 Glasses of wine per week   Drug use: No  Sexual activity: Not on file  Other Topics Concern   Not on file  Social History Narrative   Not on file   Social Determinants of Health   Financial Resource Strain: Low Risk    Difficulty of Paying Living Expenses: Not hard at all  Food Insecurity: No Food Insecurity   Worried About Running Out of Food in the Last Year: Never true   [place] in the Last Year: Never true  Transportation Needs: No Transportation Needs   Lack of Transportation (Medical): No   Lack of Transportation (Non-Medical): No  Physical Activity: Not on file  Stress: No Stress Concern Present   Feeling of Stress : Not at all  Social Connections: Unknown   Frequency of Communication with Friends and Family: Not on file   Frequency of Social Gatherings with  Friends and Family: Not on file   Attends Religious Services: Not on file   Active Member of Clubs or Organizations: Not on file   Attends Archivist Meetings: Not on file   Marital Status: Married    Tobacco Counseling Counseling given: Not Answered   Clinical Intake:  Pre-visit preparation completed: Yes        Diabetes: No  How often do you need to have someone help you when you read instructions, pamphlets, or other written materials from your doctor or pharmacy?: 1 - Never   Interpreter Needed?: No      Activities of Daily Living In your present state of health, do you have any difficulty performing the following activities: 12/21/2020  Hearing? N  Vision? N  Difficulty concentrating or making decisions? N  Walking or climbing stairs? N  Dressing or bathing? N  Doing errands, shopping? N  Preparing Food and eating ? N  Using the Toilet? N  In the past six months, have you accidently leaked urine? N  Do you have problems with loss of bowel control? N  Managing your Medications? N  Managing your Finances? N  Housekeeping or managing your Housekeeping? N  Some recent data might be hidden    Patient Care Team: Crecencio Mc, MD as PCP - General (Internal Medicine)  Indicate any recent Medical Services you may have received from other than [facility] providers in the past year (date may be approximate).     Assessment:   This is a routine wellness examination for Taylor [name].  I connected with Taylor [name] today by telephone and verified that I am speaking with the correct person using two identifiers. Location patient: home Location provider: work Persons participating in the virtual visit: patient, Marine scientist.    I discussed the limitations, risks, security and privacy concerns of performing an evaluation and management service by telephone and the availability of in person appointments. The patient expressed understanding and verbally consented to this telephonic visit.     Interactive audio and video telecommunications were attempted between this provider and patient, however failed, due to patient having technical difficulties OR patient did not have access to video capability.  We continued and completed visit with audio only.  Some vital signs may be absent or patient reported.   Hearing/Vision screen  Hearing Screening   125Hz  250Hz  500Hz  1000Hz  2000Hz  3000Hz  4000Hz  6000Hz  8000Hz   Right ear:           Left ear:           Comments: Patient is able to hear conversational tones without difficulty.  No issues reported.  Vision Screening Comments: Wears  corrective lenses  Cataract extraction, L eye Visual acuity not assessed, virtual visit. They have seen their ophthalmologist in the last 12 months.   Dietary issues and exercise activities discussed: Current Exercise Habits: Home exercise routine, Intensity: Mild  Healthy diet Good water intake  Goals       Patient Stated     Follow up with Primary Care Provider (pt-stated)      As needed       Depression Screen PHQ 2/9 Scores 12/21/2020 12/21/2019 06/03/2018 05/28/2017 10/23/2012  PHQ - 2 Score 0 0 2 0 0  PHQ- 9 Score - - 5 3 -    Fall Risk Fall Risk  12/21/2020 06/15/2020 12/21/2019 06/14/2019 06/03/2018  Falls in the past year? 0 0 0 0 No  Number falls in past yr: 0 - - - -  Injury with Fall? 0 - - - -  Follow up Falls evaluation completed Falls evaluation completed Falls evaluation completed Falls evaluation completed -    FALL RISK PREVENTION PERTAINING TO THE HOME: Handrails in use when climbing stairs? Yes Home free of loose throw rugs in walkways, pet beds, electrical cords, etc? Yes  Adequate lighting in your home to reduce risk of falls? Yes   ASSISTIVE DEVICES UTILIZED TO PREVENT FALLS: Life alert? No  Use of a cane, walker or w/c? No   TIMED UP AND GO: Was the test performed? No . Virtual visit.   Cognitive Function:  Patient is alert and oriented x3. Denies difficulty  focusing, making decisions, memory loss.  Enjoys reading and playing online brain games.  MMSE/6CIT deferred. Normal by direct communication /observation.     6CIT Screen 12/21/2019  What Year? 0 points  What month? 0 points  What time? 0 points  Count back from 20 0 points  Months in reverse 0 points  Repeat phrase 0 points  Total Score 0    Immunizations Immunization History  Administered Date(s) Administered   Fluad Quad(high Dose 65+) 06/14/2019   Influenza Split 10/24/2011   Influenza, High Dose Seasonal PF 08/05/2018, 08/08/2020   Influenza-Unspecified 07/13/2016   PFIZER(Purple Top)SARS-COV-2 Vaccination 10/18/2019, 11/08/2019   Pneumococcal Conjugate-13 05/02/2016   Pneumococcal Polysaccharide-23 04/27/2012, 06/03/2018   Tdap 04/27/2013   Zoster 04/27/2013    Health Maintenance Health Maintenance  Topic Date Due   COVID-19 Vaccine (3 - Booster for [place] series) 05/07/2020   MAMMOGRAM  02/03/2021   COLONOSCOPY (Pts 45-29yrs Insurance coverage will need to be confirmed)  03/03/2023   TETANUS/TDAP  04/28/2023   INFLUENZA VACCINE  Completed   DEXA SCAN  Completed   Hepatitis C Screening  Completed   PNA vac Low Risk Adult  Completed   HPV VACCINES  Aged Out   Colorectal cancer screening: Type of screening: Colonoscopy. Completed 03/02/13. Repeat every 10 years  Mammogram status: Completed 02/04/20. Repeat every year . Ordered.   Lung Cancer Screening: (Low Dose CT Chest recommended if Age 62-80 years, 30 pack-year currently smoking OR have quit w/in 15years.) does not qualify.   Vision Screening: Recommended annual ophthalmology exams for early detection of glaucoma and other disorders of the eye. Is the patient up to date with their annual eye exam?  Yes   Dental Screening: Recommended annual dental exams for proper oral hygiene. Visits every 6 months.   Community Resource Referral / Chronic Care Management: CRR required this visit?  No   CCM required this  visit?  No      Plan:   Keep all routine  maintenance appointments.   Cpe 07/03/21  Mammogram ordered. Number for scheduling provided via mychart.   I have personally reviewed and noted the following in the patient's chart:   Medical and social history Use of alcohol, tobacco or illicit drugs  Current medications and supplements Functional ability and status Nutritional status Physical activity Advanced directives List of other physicians Hospitalizations, surgeries, and ER visits in previous 12 months Vitals Screenings to include cognitive, depression, and falls Referrals and appointments  In addition, I have reviewed and discussed with patient certain preventive protocols, quality metrics, and best practice recommendations. A written personalized care plan for preventive services as well as general preventive health recommendations were provided to patient via mychart.     OBrien-Blaney, Yesenia Fontenette L, LPN   [id number]    I have reviewed the above information and agree with above.   Deborra Medina, MD

## 2020-12-21 NOTE — Patient Instructions (Addendum)
Taylor [name] , Thank you for taking time to come for your Medicare Wellness Visit. I appreciate your ongoing commitment to your health goals. Please review the following plan we discussed and let me know if I can assist you in the future.   These are the goals we discussed: Goals      Patient Stated   .  Follow up with Primary Care Provider (pt-stated)      As needed       This is a list of the screening recommended for you and due dates:  Health Maintenance  Topic Date Due  . COVID-19 Vaccine (3 - Booster for Pf[employer] series) 05/07/2020  . Mammogram  02/03/2021  . Colon Cancer Screening  03/03/2023  . Tetanus Vaccine  04/28/2023  . Flu Shot  Completed  . DEXA scan (bone density measurement)  Completed  .  Hepatitis C: One time screening is recommended by Center for Disease Control  (CDC) for  adults born from [birth year] through 1965.   Completed  . Pneumonia vaccines  Completed  . HPV Vaccine  Aged Out   Keep all routine maintenance appointments.   Cpe 07/03/21  Advanced directives: not yet completed.   Conditions/risks identified: none new.   Follow up in one year for your annual wellness visit.   Preventive Care 70 Years and Older, Female Preventive care refers to lifestyle choices and visits with your health care provider that can promote health and wellness. What does preventive care include?  A yearly physical exam. This is also called an annual well check.  Dental exams once or twice a year.  Routine eye exams. Ask your health care provider how often you should have your eyes checked.  Personal lifestyle choices, including:  Daily care of your teeth and gums.  Regular physical activity.  Eating a healthy diet.  Avoiding tobacco and drug use.  Limiting alcohol use.  Practicing safe sex.  Taking low-dose aspirin every day.  Taking vitamin and mineral supplements as recommended by your health care provider. What happens during an annual well check? The  services and screenings done by your health care provider during your annual well check will depend on your age, overall health, lifestyle risk factors, and family history of disease. Counseling  Your health care provider may ask you questions about your:  Alcohol use.  Tobacco use.  Drug use.  Emotional well-being.  Home and relationship well-being.  Sexual activity.  Eating habits.  History of falls.  Memory and ability to understand (cognition).  Work and work Astronomer.  Reproductive health. Screening  You may have the following tests or measurements:  Height, weight, and BMI.  Blood pressure.  Lipid and cholesterol levels. These may be checked every 5 years, or more frequently if you are over 66 years old.  Skin check.  Lung cancer screening. You may have this screening every year starting at age 51 if you have a 30-pack-year history of smoking and currently smoke or have quit within the past 15 years.  Fecal occult blood test (FOBT) of the stool. You may have this test every year starting at age 54.  Flexible sigmoidoscopy or colonoscopy. You may have a sigmoidoscopy every 5 years or a colonoscopy every 10 years starting at age 63.  Hepatitis C blood test.  Hepatitis B blood test.  Sexually transmitted disease (STD) testing.  Diabetes screening. This is done by checking your blood sugar (glucose) after you have not eaten for a while (fasting). You may  have this done every 1-3 years.  Bone density scan. This is done to screen for osteoporosis. You may have this done starting at age 28.  Mammogram. This may be done every 1-2 years. Talk to your health care provider about how often you should have regular mammograms. Talk with your health care provider about your test results, treatment options, and if necessary, the need for more tests. Vaccines  Your health care provider may recommend certain vaccines, such as:  Influenza vaccine. This is recommended  every year.  Tetanus, diphtheria, and acellular pertussis (Tdap, Td) vaccine. You may need a Td booster every 10 years.  Zoster vaccine. You may need this after age 47.  Pneumococcal 13-valent conjugate (PCV13) vaccine. One dose is recommended after age 19.  Pneumococcal polysaccharide (PPSV23) vaccine. One dose is recommended after age 45. Talk to your health care provider about which screenings and vaccines you need and how often you need them. This information is not intended to replace advice given to you by your health care provider. Make sure you discuss any questions you have with your health care provider. Document Released: 10/27/2015 Document Revised: 06/19/2016 Document Reviewed: 08/01/2015 Elsevier Interactive Patient Education  2017 [employer].  Fall Prevention in the Home Falls can cause injuries. They can happen to people of all ages. There are many things you can do to make your home safe and to help prevent falls. What can I do on the outside of my home?  Regularly fix the edges of walkways and driveways and fix any cracks.  Remove anything that might make you trip as you walk through a door, such as a raised step or threshold.  Trim any bushes or trees on the path to your home.  Use bright outdoor lighting.  Clear any walking paths of anything that might make someone trip, such as rocks or tools.  Regularly check to see if handrails are loose or broken. Make sure that both sides of any steps have handrails.  Any raised decks and porches should have guardrails on the edges.  Have any leaves, snow, or ice cleared regularly.  Use sand or salt on walking paths during winter.  Clean up any spills in your garage right away. This includes oil or grease spills. What can I do in the bathroom?  Use night lights.  Install grab bars by the toilet and in the tub and shower. Do not use towel bars as grab bars.  Use non-skid mats or decals in the tub or shower.  If  you need to sit down in the shower, use a plastic, non-slip stool.  Keep the floor dry. Clean up any water that spills on the floor as soon as it happens.  Remove soap buildup in the tub or shower regularly.  Attach bath mats securely with double-sided non-slip rug tape.  Do not have throw rugs and other things on the floor that can make you trip. What can I do in the bedroom?  Use night lights.  Make sure that you have a light by your bed that is easy to reach.  Do not use any sheets or blankets that are too big for your bed. They should not hang down onto the floor.  Have a firm chair that has side arms. You can use this for support while you get dressed.  Do not have throw rugs and other things on the floor that can make you trip. What can I do in the kitchen?  Clean up  any spills right away.  Avoid walking on wet floors.  Keep items that you use a lot in easy-to-reach places.  If you need to reach something above you, use a strong step stool that has a grab bar.  Keep electrical cords out of the way.  Do not use floor polish or wax that makes floors slippery. If you must use wax, use non-skid floor wax.  Do not have throw rugs and other things on the floor that can make you trip. What can I do with my stairs?  Do not leave any items on the stairs.  Make sure that there are handrails on both sides of the stairs and use them. Fix handrails that are broken or loose. Make sure that handrails are as long as the stairways.  Check any carpeting to make sure that it is firmly attached to the stairs. Fix any carpet that is loose or worn.  Avoid having throw rugs at the top or bottom of the stairs. If you do have throw rugs, attach them to the floor with carpet tape.  Make sure that you have a light switch at the top of the stairs and the bottom of the stairs. If you do not have them, ask someone to add them for you. What else can I do to help prevent falls?  Wear shoes  that:  Do not have high heels.  Have rubber bottoms.  Are comfortable and fit you well.  Are closed at the toe. Do not wear sandals.  If you use a stepladder:  Make sure that it is fully opened. Do not climb a closed stepladder.  Make sure that both sides of the stepladder are locked into place.  Ask someone to hold it for you, if possible.  Clearly mark and make sure that you can see:  Any grab bars or handrails.  First and last steps.  Where the edge of each step is.  Use tools that help you move around (mobility aids) if they are needed. These include:  Canes.  Walkers.  Scooters.  Crutches.  Turn on the lights when you go into a dark area. Replace any light bulbs as soon as they burn out.  Set up your furniture so you have a clear path. Avoid moving your furniture around.  If any of your floors are uneven, fix them.  If there are any pets around you, be aware of where they are.  Review your medicines with your doctor. Some medicines can make you feel dizzy. This can increase your chance of falling. Ask your doctor what other things that you can do to help prevent falls. This information is not intended to replace advice given to you by your health care provider. Make sure you discuss any questions you have with your health care provider. Document Released: 07/27/2009 Document Revised: 03/07/2016 Document Reviewed: 11/04/2014 Elsevier Interactive Patient Education  2017 [employer].

## 2020-12-26 ENCOUNTER — Encounter: Payer: Self-pay | Admitting: Internal Medicine

## 2021-01-03 ENCOUNTER — Other Ambulatory Visit: Payer: Self-pay | Admitting: Internal Medicine

## 2021-01-03 DIAGNOSIS — Z1159 Encounter for screening for other viral diseases: Secondary | ICD-10-CM

## 2021-02-14 ENCOUNTER — Encounter: Payer: Self-pay | Admitting: Family

## 2021-02-14 ENCOUNTER — Ambulatory Visit (INDEPENDENT_AMBULATORY_CARE_PROVIDER_SITE_OTHER): Payer: Medicare Other | Admitting: Family

## 2021-02-14 DIAGNOSIS — U071 COVID-19: Secondary | ICD-10-CM | POA: Diagnosis not present

## 2021-02-14 DIAGNOSIS — Z8616 Personal history of COVID-19: Secondary | ICD-10-CM | POA: Insufficient documentation

## 2021-02-14 MED ORDER — HYDROCOD POLST-CPM POLST ER 10-8 MG/5ML PO SUER: 5.0000 mL | Freq: Two times a day (BID) | ORAL | 0 refills | Status: DC | PRN

## 2021-02-14 NOTE — Assessment & Plan Note (Addendum)
Afebrile. No sob or acute respiratory distress. Discussed appropriateness for referral for covid treatment based on age. Given prescription for tussionex. Patient will let me know how she is doing and of any new concerns.

## 2021-02-14 NOTE — Progress Notes (Signed)
Virtual Visit via Video Note  I connected with@  on 02/14/21 at  1:00 PM EDT by a video enabled telemedicine application and verified that I am speaking with the correct person using two identifiers.  Location patient: home Location provider:work  Persons participating in the virtual visit: patient, provider  I discussed the limitations of evaluation and management by telemedicine and the availability of in person appointments. The patient expressed understanding and agreed to proceed.   HPI:  Acute visit covid positive today.  Productive intermittent cough started yesterday.  Temperature 99.6 F this morning.  No sob, cp.  Sa02 99%.   She is eating and drinking well.  She prompted to test after husband was positive covid and treated with Pavlovid.  Notes 2 weeks ago had sinus congestion which completely resolved.   H/o PNA  No lung disease.    ROS: See pertinent positives and negatives per HPI.    EXAM:  VITALS per patient if applicable: Ht 5\' 4"  (1.626 m)   Wt 118 lb (53.5 kg)   SpO2 96%   BMI 20.25 kg/m  BP Readings from Last 3 Encounters:  06/15/20 114/80  06/14/19 102/78  12/09/18 122/76   Wt Readings from Last 3 Encounters:  02/14/21 118 lb (53.5 kg)  12/21/20 121 lb (54.9 kg)  06/15/20 121 lb 12.8 oz (55.2 kg)    GENERAL: alert, oriented, appears well and in no acute distress  HEENT: atraumatic, conjunttiva clear, no obvious abnormalities on inspection of external nose and ears  NECK: normal movements of the head and neck  LUNGS: on inspection no signs of respiratory distress, breathing rate appears normal, no obvious gross SOB, gasping or wheezing  CV: no obvious cyanosis  MS: moves all visible extremities without noticeable abnormality  PSYCH/NEURO: pleasant and cooperative, no obvious depression or anxiety, speech and thought processing grossly intact  ASSESSMENT AND PLAN:  Discussed the following assessment and plan:  Problem List Items  Addressed This Visit      Other   COVID-19    Afebrile. No sob or acute respiratory distress. Discussed appropriateness for referral for covid treatment based on age. Given prescription for tussionex. Patient will let me know how she is doing and of any new concerns.       Relevant Medications   chlorpheniramine-HYDROcodone (TUSSIONEX PENNKINETIC ER) 10-8 MG/5ML SUER   Other Relevant Orders   Ambulatory referral for Covid Treatment      -we discussed possible serious and likely etiologies, options for evaluation and workup, limitations of telemedicine visit vs in person visit, treatment, treatment risks and precautions. Pt prefers to treat via telemedicine empirically rather then risking or undertaking an in person visit at this moment.  .   I discussed the assessment and treatment plan with the patient. The patient was provided an opportunity to ask questions and all were answered. The patient agreed with the plan and demonstrated an understanding of the instructions.   The patient was advised to call back or seek an in-person evaluation if the symptoms worsen or if the condition fails to improve as anticipated.   08/15/20, FNP

## 2021-02-15 ENCOUNTER — Telehealth: Payer: Self-pay | Admitting: Infectious Diseases

## 2021-02-15 DIAGNOSIS — U071 COVID-19: Secondary | ICD-10-CM

## 2021-02-15 NOTE — Telephone Encounter (Signed)
Called to discuss with patient about COVID-19 symptoms and the use of one of the available treatments for those with mild to moderate Covid symptoms and at a high risk of hospitalization.  Pt appears to qualify for outpatient treatment due to co-morbid conditions and/or a member of an at-risk group in accordance with the FDA Emergency Use Authorization.    Symptom onset: 5/03 Vaccinated: yes Booster? yes Immunocompromised? no Qualifiers: Age > 7  NIH Criteria: 2   She is very mildly ill and overall feels pretty well. We discussed options to watch and wait over the next 36 hours. I sent her a mychart message if she needs some paxlovid. I would give her the reduced dose 150/100mg  tablet due to the last few eGFRs 57-62. I don't see any drug interactions aside from flonase (which she can hold short term) and statin (which she can also hold short term).    Rexene Alberts, MSN, NP-C [facility]  [email or web address] Pager: [phone] Office: [phone] [facility] Main Line: [phone]

## 2021-02-16 MED ORDER — NIRMATRELVIR/RITONAVIR (PAXLOVID)TABLET: 2.0000 | ORAL_TABLET | Freq: Two times a day (BID) | ORAL | 0 refills | Status: AC

## 2021-03-05 ENCOUNTER — Ambulatory Visit
Admission: RE | Admit: 2021-03-05 | Discharge: 2021-03-05 | Disposition: A | Discharge status: Home / Self Care | Payer: Medicare Other | Source: Ambulatory Visit | Attending: Internal Medicine | Admitting: Internal Medicine

## 2021-03-05 ENCOUNTER — Other Ambulatory Visit: Payer: Self-pay

## 2021-03-05 DIAGNOSIS — Z1231 Encounter for screening mammogram for malignant neoplasm of breast: Secondary | ICD-10-CM | POA: Insufficient documentation

## 2021-07-03 ENCOUNTER — Encounter: Payer: Self-pay | Admitting: Internal Medicine

## 2021-07-03 ENCOUNTER — Ambulatory Visit (INDEPENDENT_AMBULATORY_CARE_PROVIDER_SITE_OTHER): Payer: Medicare Other | Admitting: Internal Medicine

## 2021-07-03 ENCOUNTER — Other Ambulatory Visit: Payer: Self-pay

## 2021-07-03 VITALS — BP 110/82 | HR 82 | Ht 64.0 in | Wt 122.4 lb

## 2021-07-03 DIAGNOSIS — E559 Vitamin D deficiency, unspecified: Secondary | ICD-10-CM

## 2021-07-03 DIAGNOSIS — E785 Hyperlipidemia, unspecified: Secondary | ICD-10-CM | POA: Diagnosis not present

## 2021-07-03 DIAGNOSIS — Z23 Encounter for immunization: Secondary | ICD-10-CM | POA: Diagnosis not present

## 2021-07-03 DIAGNOSIS — S81002S Unspecified open wound, left knee, sequela: Secondary | ICD-10-CM | POA: Diagnosis not present

## 2021-07-03 DIAGNOSIS — Z8781 Personal history of (healed) traumatic fracture: Secondary | ICD-10-CM

## 2021-07-03 DIAGNOSIS — M8000XS Age-related osteoporosis with current pathological fracture, unspecified site, sequela: Secondary | ICD-10-CM

## 2021-07-03 DIAGNOSIS — K219 Gastro-esophageal reflux disease without esophagitis: Secondary | ICD-10-CM

## 2021-07-03 DIAGNOSIS — Z780 Asymptomatic menopausal state: Secondary | ICD-10-CM

## 2021-07-03 DIAGNOSIS — K915 Postcholecystectomy syndrome: Secondary | ICD-10-CM

## 2021-07-03 LAB — LIPID PANEL
Cholesterol: 193 mg/dL (ref 0–200)
HDL: 73.4 mg/dL (ref >39.00)
LDL Cholesterol: 93 mg/dL (ref 0–99)
NonHDL: 119.45
Total CHOL/HDL Ratio: 3
Triglycerides: 133 mg/dL (ref 0.0–149.0)
VLDL: 26.6 mg/dL (ref 0.0–40.0)

## 2021-07-03 LAB — COMPREHENSIVE METABOLIC PANEL
ALT: 14 U/L (ref 0–35)
AST: 19 U/L (ref 0–37)
Albumin: 4.2 g/dL (ref 3.5–5.2)
Alkaline Phosphatase: 81 U/L (ref 39–117)
BUN: 16 mg/dL (ref 6–23)
CO2: 27 mEq/L (ref 19–32)
Calcium: 9.6 mg/dL (ref 8.4–10.5)
Chloride: 106 mEq/L (ref 96–112)
Creatinine, Ser: 0.85 mg/dL (ref 0.40–1.20)
GFR: 69.51 mL/min (ref >60.00)
Glucose, Bld: 107 mg/dL — ABNORMAL HIGH (ref 70–99)
Potassium: 4.1 mEq/L (ref 3.5–5.1)
Sodium: 140 mEq/L (ref 135–145)
Total Bilirubin: 0.5 mg/dL (ref 0.2–1.2)
Total Protein: 7.1 g/dL (ref 6.0–8.3)

## 2021-07-03 LAB — TSH: TSH: 1.82 u[IU]/mL (ref 0.35–5.50)

## 2021-07-03 LAB — VITAMIN D 25 HYDROXY (VIT D DEFICIENCY, FRACTURES): VITD: 27.98 ng/mL — ABNORMAL LOW (ref 30.00–100.00)

## 2021-07-03 MED ORDER — ZOSTER VAC RECOMB ADJUVANTED 50 MCG/0.5ML IM SUSR: 0.5000 mL | Freq: Once | INTRAMUSCULAR | 1 refills | Status: AC

## 2021-07-03 NOTE — Progress Notes (Signed)
Patient ID: Taylor [name], female    DOB: [birth year]  Age: 70 y.o. MRN: [id number]  The patient is here for follow up and  management of other chronic and acute problems.  This visit occurred during the SARS-CoV-2 public health emergency.  Safety protocols were in place, including screening questions prior to the visit, additional usage of staff PPE, and extensive cleaning of exam room while observing appropriate contact time as indicated for disinfecting solutions.   Has had COVID FIRST BOOSTER.     The risk factors are reflected in the social history.  The roster of all physicians providing medical care to patient - is listed in the Snapshot section of the chart.  Activities of daily living:  The patient is 100% independent in all ADLs: dressing, toileting, feeding as well as independent mobility  Home safety : The patient has smoke detectors in the home. They wear seatbelts.  There are no firearms at home. There is no violence in the home.   There is no risks for hepatitis, STDs or HIV. There is no   history of blood transfusion. They have no travel history to infectious disease endemic areas of the world.  The patient has seen their dentist in the last six month. They have seen their eye doctor in the last year. They admit to slight hearing difficulty with regard to whispered voices and some television programs.  They have deferred audiologic testing in the last year.  They do not  have excessive sun exposure. Discussed the need for sun protection: hats, long sleeves and use of sunscreen if there is significant sun exposure.   Diet: the importance of a healthy diet is discussed. They do have a healthy diet.  The benefits of regular aerobic exercise were discussed. She plays pickle ball 5 days per week.  walks 4 times per week ,  20 minutes.   Depression screen: there are no signs or vegative symptoms of depression- irritability, change in appetite, anhedonia,  sadness/tearfullness.  Cognitive assessment: the patient manages all their financial and personal affairs and is actively engaged. They could relate day,date,year and events; recalled 2/3 objects at 3 minutes; performed clock-face test normally.  The following portions of the patient's history were reviewed and updated as appropriate: allergies, current medications, past family history, past medical history,  past surgical history, past social history  and problem list.  Visual acuity was not assessed per patient preference since she has regular follow up with her ophthalmologist. Hearing and body mass index were assessed and reviewed.   During the course of the visit the patient was educated and counseled about appropriate screening and preventive services including : fall prevention , diabetes screening, nutrition counseling, colorectal cancer screening, and recommended immunizations.    CC: The primary encounter diagnosis was Need for immunization against influenza. Diagnoses of History of closed Colles' fracture, Postmenopausal estrogen deficiency, Open wound of left knee, sequela, Vitamin D deficiency, Hyperlipidemia LDL goal <160, Gastroesophageal reflux disease without esophagitis, Post-cholecystectomy syndrome, and Osteoporosis with current pathological fracture, unspecified osteoporosis type, sequela were also pertinent to this visit.  1)  sustained a closed fracture of the left  radius and ulna after a fall playing pickle ball.  Last September 2021 . Fracture was reduced and casted. , Released by Hyacinth Meeker in January and has resumed pickle ball. Last DEXA was 2020 , ordered today  2) mammogram normal  2022  3) Reviewed last years's CT angiogram:  no aortic aneurysm. No follow up unless symptoms  occur.   4) Hyperlipidemia TAKING CRESTOR 5 MG EVERY OTHER DAY CALCIUM SCORE IS ZERO  GERD:  FAMOTIDINE AND PROTONIX AS  NEEDED   Derm: Sees Dawn Purcell Nails annually     [name] has a past  medical history of Chest pain, Fever blister, Hyperlipidemia, Irritable bowel syndrome, Mitral valve prolapse, Normal cardiac stress test (2007), Osteoporosis, and Pill esophagitis (10/23/2012).   She has a past surgical history that includes Cholecystectomy (Approx - 2002) and Cataract extraction (Left, 07/2019).   Her family history includes Aortic aneurysm in her paternal grandfather; Heart disease (age of onset: 85) in her father; Heart disease (age of onset: 18) in her brother; Multiple sclerosis in her mother.She reports that she has never smoked. She has never used smokeless tobacco. She reports current alcohol use of about 3.0 standard drinks per week. She reports that she does not use drugs.  Outpatient Medications Prior to Visit  Medication Sig Dispense Refill   Calcium-Vitamin D-Vitamin K (VIACTIV PO) Take as directed     cholestyramine (QUESTRAN) 4 GM/DOSE powder USE 1 SCOOPFUL 3 TIMES DAILY WITH MEALS (Patient taking differently: Take 1 g by mouth every morning. USE 1 SCOOPFUL 3 TIMES DAILY WITH MEALS) 378 g 5   rosuvastatin (CRESTOR) 10 MG tablet TAKE ONE TABLET BY MOUTH AT BEDTIME (Patient taking differently: Take 5 mg by mouth every other day.) 90 tablet 1   timolol (TIMOPTIC) 0.5 % ophthalmic solution      vitamin B-12 (CYANOCOBALAMIN) 1000 MCG tablet Take 1,000 mcg by mouth daily.     VITAMIN D, CHOLECALCIFEROL, PO Take one by mouth daily     famotidine (PEPCID) 20 MG tablet Take 1 tablet (20 mg total) by mouth daily. Before dinner meal 90 tablet 1   chlorpheniramine-HYDROcodone (TUSSIONEX PENNKINETIC ER) 10-8 MG/5ML SUER Take 5 mLs by mouth every 12 (twelve) hours as needed for cough. (Patient not taking: Reported on 07/03/2021) 70 mL 0   fluticasone (FLONASE) 50 MCG/ACT nasal spray USE 2 SPRAYS IN EACH NOSTRIL EVERY DAY (Patient not taking: Reported on 07/03/2021) 16 g 2   pantoprazole (PROTONIX) 40 MG tablet Take 1 tablet (40 mg total) by mouth daily. (Patient not taking: Reported  on 07/03/2021) 90 tablet 1   No facility-administered medications prior to visit.    Review of Systems  Patient denies headache, fevers, malaise, unintentional weight loss, skin rash, eye pain, sinus congestion and sinus pain, sore throat, dysphagia,  hemoptysis , cough, dyspnea, wheezing, chest pain, palpitations, orthopnea, edema, abdominal pain, nausea, melena, diarrhea, constipation, flank pain, dysuria, hematuria, urinary  Frequency, nocturia, numbness, tingling, seizures,  Focal weakness, Loss of consciousness,  Tremor, insomnia, depression, anxiety, and suicidal ideation.     Objective:  BP 110/82 (BP Location: Left Arm, Patient Position: Sitting, Cuff Size: Normal)   Pulse 82   Ht 5\' 4"  (1.626 m)   Wt 122 lb 6.4 oz (55.5 kg)   SpO2 96%   BMI 21.01 kg/m   Physical Exam  General appearance: alert, cooperative and appears stated age Head: Normocephalic, without obvious abnormality, atraumatic Eyes: conjunctivae/corneas clear. PERRL, EOM's intact. Fundi benign. Ears: normal TM's and external ear canals both ears Nose: Nares normal. Septum midline. Mucosa normal. No drainage or sinus tenderness. Throat: lips, mucosa, and tongue normal; teeth and gums normal Neck: no adenopathy, no carotid bruit, no JVD, supple, symmetrical, trachea midline and thyroid not enlarged, symmetric, no tenderness/mass/nodules Lungs: clear to auscultation bilaterally Breasts: normal appearance, no masses or tenderness Heart: regular rate and  rhythm, S1, S2 normal, no murmur, click, rub or gallop Abdomen: soft, non-tender; bowel sounds normal; no masses,  no organomegaly Extremities: extremities normal, atraumatic, no cyanosis or edema Pulses: 2+ and symmetric Skin: Skin color, texture, turgor normal. No rashes or lesions Neurologic: Alert and oriented X 3, normal strength and tone. Normal symmetric reflexes. Normal coordination and gait.    Assessment & Plan:   Problem List Items Addressed This Visit        Unprioritized   Osteoporosis    Managed with Evista from 2018 to 2020,  And with alendronate from 2011 to 2013 (pill esophagitis).  Repeat DEXA in 2020 shows improvement, but she sustained a fracture to left forearm in Sept 2021 which was treated by [name].  DEXA ordered.       Post-cholecystectomy syndrome    Diarrhea managed with Questran       Hyperlipidemia LDL goal <160    LDL and triglycerides are at goal on 5 mg crestor every other day .  Coronary calcium score from CT was zero.  . She has no side effects and liver enzymes are normal. No changes today   Lab Results  Component Value Date   CHOL 193 07/03/2021   HDL 73.40 07/03/2021   LDLCALC 93 07/03/2021   LDLDIRECT 122.6 04/23/2013   TRIG 133.0 07/03/2021   CHOLHDL 3 07/03/2021   Lab Results  Component Value Date   ALT 14 07/03/2021   AST 19 07/03/2021   ALKPHOS 81 07/03/2021   BILITOT 0.5 07/03/2021         Relevant Orders   Lipid panel (Completed)   Comprehensive metabolic panel (Completed)   TSH (Completed)   GERD (gastroesophageal reflux disease)    Treating symptoms prn , infrequent with PPI and H2 blocker      Open wound of left knee     Wound healed withotu interventoin       Other Visit Diagnoses     Need for immunization against influenza    -  Primary   Relevant Orders   Flu Vaccine QUAD High Dose(Fluad) (Completed)   History of closed Colles' fracture       Postmenopausal estrogen deficiency       Relevant Orders   DG Bone Density   Vitamin D deficiency       Relevant Orders   VITAMIN D 25 Hydroxy (Vit-D Deficiency, Fractures) (Completed)       I provided  30 minutes of  face-to-face time during this encounter reviewing patient's current problems and past surgeries, labs and imaging studies, providing counseling on the above mentioned problems , and coordination  of care .  Meds ordered this encounter  Medications   Zoster Vaccine Adjuvanted [facility]) injection     Sig: Inject 0.5 mLs into the muscle once for 1 dose.    Dispense:  1 each    Refill:  1    Medications Discontinued During This Encounter  Medication Reason   chlorpheniramine-HYDROcodone (TUSSIONEX PENNKINETIC ER) 10-8 MG/5ML SUER    fluticasone (FLONASE) 50 MCG/ACT nasal spray    pantoprazole (PROTONIX) 40 MG tablet    famotidine (PEPCID) 20 MG tablet     Follow-up: No follow-ups on file.   [name], MD

## 2021-07-03 NOTE — Assessment & Plan Note (Signed)
Wound healed withotu interventoin

## 2021-07-03 NOTE — Patient Instructions (Addendum)
I recommend taking  melatonin every evening after dinner start with 3 mg dose   Max effective dose is 6 mg   The ShingRx vaccine is now available in local pharmacies and is much more protective than Zostavaxs,  It is therefore ADVISED for all interested adults over 50 to prevent shingles.    DEXA scan ordered.  If you do not hear from our office in a week,   Please call us b

## 2021-07-04 NOTE — Assessment & Plan Note (Signed)
Treating symptoms prn , infrequent with PPI and H2 blocker

## 2021-07-04 NOTE — Assessment & Plan Note (Signed)
LDL and triglycerides are at goal on 5 mg crestor every other day .  Coronary calcium score from CT was zero.  . She has no side effects and liver enzymes are normal. No changes today   Lab Results  Component Value Date   CHOL 193 07/03/2021   HDL 73.40 07/03/2021   LDLCALC 93 07/03/2021   LDLDIRECT 122.6 04/23/2013   TRIG 133.0 07/03/2021   CHOLHDL 3 07/03/2021   Lab Results  Component Value Date   ALT 14 07/03/2021   AST 19 07/03/2021   ALKPHOS 81 07/03/2021   BILITOT 0.5 07/03/2021

## 2021-07-04 NOTE — Assessment & Plan Note (Addendum)
Managed with Evista from 2018 to 2020,  And with alendronate from 2011 to 2013 (pill esophagitis).  Repeat DEXA in 2020 shows improvement, but she sustained a fracture to left forearm in Sept 2021 which was treated by [name].  DEXA ordered.

## 2021-07-04 NOTE — Assessment & Plan Note (Signed)
Diarrhea managed with Questran

## 2021-08-27 ENCOUNTER — Telehealth (INDEPENDENT_AMBULATORY_CARE_PROVIDER_SITE_OTHER): Payer: Medicare Other | Admitting: Family Medicine

## 2021-08-27 ENCOUNTER — Encounter: Payer: Self-pay | Admitting: Family Medicine

## 2021-08-27 DIAGNOSIS — J018 Other acute sinusitis: Secondary | ICD-10-CM

## 2021-08-27 MED ORDER — AZITHROMYCIN 250 MG PO TABS: ORAL_TABLET | ORAL | 0 refills | Status: AC

## 2021-08-27 NOTE — Progress Notes (Signed)
Virtual Visit via Telephone Note  I connected with Taylor [name] on 08/27/21 at  1:00 PM EST by telephone and verified that I am speaking with the correct person using two identifiers.   I discussed the limitations, risks, security and privacy concerns of performing an evaluation and management service by telephone and the availability of in person appointments. I also discussed with the patient that there may be a patient responsible charge related to this service. The patient expressed understanding and agreed to proceed.  Location patient: home Location provider: work or home office Participants present for the call: patient, provider Patient did not have a visit in the prior 7 days to address this/these issue(s).  Chief Complaint  Patient presents with   Sinus Problem    Runny nose started about 12 days ago, now pt is having earaches, cough, severe sinus problems. Fever is running between 100-101.     History of Present Illness: Pt is a 70 year old female followed by Taylor Dull, MD seen for ongoing concern.  Endorses "sniffles" starting 12 days ago.  Cough started last wk.  Over the wknd developed sinus pain, sore throat, "bad cough", temp Tmax 101F, ear pain.  Denies body aches, loss of taste or smell, sick contacts.  Tried OTC nasal spray, Alka Seltzer cold and flu, hot compresses on face.  Used her husband's left over abx ear drops over the wknd- Neomycin/polymixin.   Observations/Objective: Patient sounds cheerful and well on the phone. I do not appreciate any SOB. Speech and thought processing are grossly intact. Patient reported vitals:  Assessment and Plan: Other acute sinusitis, recurrence not specified  -Continue other supportive care including Flonase nasal spray, other OTC medications, gargling with warm salt water Chloraseptic spray for throat. -Rx for azithromycin sent to pharmacy.  Patient advised to avoid taking medication not prescribed her especially  antibiotics. -Given precautions - Plan: azithromycin (ZITHROMAX) 250 MG tablet   Follow Up Instructions:  Follow-up with PCP as needed   [id number] 5-10 [id number] 11-20 [id number] 21-30 I did not refer this patient for an OV in the next 24 hours for this/these issue(s).  I discussed the assessment and treatment plan with the patient. The patient was provided an opportunity to ask questions and all were answered. The patient agreed with the plan and demonstrated an understanding of the instructions.   The patient was advised to call back or seek an in-person evaluation if the symptoms worsen or if the condition fails to improve as anticipated.  I provided 8 minutes of non-face-to-face time during this encounter.   Taylor Saint, MD

## 2021-08-30 ENCOUNTER — Encounter: Payer: Self-pay | Admitting: Emergency Medicine

## 2021-08-30 ENCOUNTER — Other Ambulatory Visit: Payer: Self-pay

## 2021-08-30 ENCOUNTER — Telehealth: Payer: Self-pay | Admitting: Internal Medicine

## 2021-08-30 ENCOUNTER — Encounter: Payer: Self-pay | Admitting: Internal Medicine

## 2021-08-30 ENCOUNTER — Ambulatory Visit
Admission: EM | Admit: 2021-08-30 | Discharge: 2021-08-30 | Disposition: A | Discharge status: Home / Self Care | Payer: Medicare Other

## 2021-08-30 DIAGNOSIS — T7840XA Allergy, unspecified, initial encounter: Secondary | ICD-10-CM | POA: Diagnosis not present

## 2021-08-30 NOTE — Telephone Encounter (Signed)
Pt is going to UC

## 2021-08-30 NOTE — ED Triage Notes (Addendum)
Pt started azithromycin on 08/27/21 and continued to take until 08/29/21. The day after first does pt began to itch on her stomach. On 08/29/21 pt noticed a rash on her stomach. Pt denies any other symptoms.

## 2021-08-30 NOTE — Telephone Encounter (Signed)
Pt called in stating that she started taking her Zpack on Monday. Pt stated that she took 2 on Monday, 1 on Tuesday, and 1 on Wednesday. Pt stated that she started having a reaction on Wednesday. Pt saw a rash under both breast and rash extends out  going toward her back. Pt stated that Dr. Chestine Spore prescribe the Zpack during a mychart visit. Pt was transfer to access nurse. Access nurse advise pt to callback to schedule an appointment within 4 hours. Didn't see access nurse note. Pt stated that she had no way to [employer] office. Pt stated she will go to Urgent Care.

## 2021-08-30 NOTE — Discharge Instructions (Addendum)
Increase fluid intake. You may take Benadryl 25-50mg  every 4-6 hours as needed OR Zyrtec 10mg  daily for itching/inflammation. You may also take over the counter Famotidine 20mg  once daily to help with itching/inflammation. You may apply ice wrapped in a towel to affected area 3-5 times daily for 10-15 minute intervals. See your PCP if rash does not improve in 5-6 days. See your PCP or return to clinic sooner if rash worsens or you develop fever.

## 2021-08-30 NOTE — ED Provider Notes (Signed)
Chief Complaint   Chief Complaint  Patient presents with   Rash     Subjective, HPI  Taylor [name] is a very pleasant 70 y.o. female who presents with rash.  Patient reports that she started azithromycin on 08/27/2021 and stopped taking this medication yesterday.  Patient reports that after the first dose she began to have itching to her stomach.  She denies any throat closing sensation or shortness of breath.  History obtained from patient.   Patient's problem list, past medical and social history, medications, and allergies were reviewed by me and updated in Epic.    ROS  See HPI.  Objective   Vitals:   08/30/21 1129  BP: (!) 142/89  Pulse: 98  Temp: 98.9 F (37.2 C)  SpO2: 98%    Vital signs and nursing note reviewed.  General: Appears well-developed and well-nourished. No acute distress.  HEENT: Normocephalic, atraumatic, hearing grossly intact. EOMI, no drainage. No rhinorrhea. Moist mucous membranes.  Neck: Normal range of motion, neck is supple.  Cardiovascular: Normal rate.  Pulm/Chest: No respiratory distress.   Musculoskeletal: No joint deformity, normal range of motion.  Skin: Diffuse erythematous rash to abdomen without drainage or TTP.  Data  No results found for any visits on 08/30/21.   Assessment & Plan  1. Allergic reaction to drug, initial encounter  70 y.o. female presents with rash.  Patient reports that she started azithromycin on 08/27/2021 and stopped taking this medication yesterday.  Patient reports that after the first dose she began to have itching to her stomach.  She denies any throat closing sensation or shortness of breath.  Given symptoms along with recently starting a new antibiotic that the patient has never taken, likely reaction to antibiotic.  The patient does have a patent airway in clinic today and breath sounds are normal.  She is in no acute distress.  Advised to take Benadryl and Pepcid at home.  May use ice to the affected areas  to help with itching.  Follow-up with PCP if rash does not improve in 5 to 6 days.  Return to clinic or follow-up with PCP sooner if rash worsens or you begin to develop a fever.  Patient verbalized understanding and agreed with plan.  Patient stable upon discharge.  Return as needed.  Plan:   Discharge Instructions      Increase fluid intake. You may take Benadryl 25-50mg  every 4-6 hours as needed OR Zyrtec 10mg  daily for itching/inflammation. You may also take over the counter Famotidine 20mg  once daily to help with itching/inflammation. You may apply ice wrapped in a towel to affected area 3-5 times daily for 10-15 minute intervals. See your PCP if rash does not improve in 5-6 days. See your PCP or return to clinic sooner if rash worsens or you develop fever.          , FNP 08/30/21 1205

## 2021-09-02 MED ORDER — LEVOFLOXACIN 500 MG PO TABS: 500.0000 mg | ORAL_TABLET | Freq: Every day | ORAL | 0 refills | Status: AC

## 2021-10-02 ENCOUNTER — Encounter: Payer: Self-pay | Admitting: Internal Medicine

## 2021-10-03 ENCOUNTER — Other Ambulatory Visit: Payer: Self-pay | Admitting: Internal Medicine

## 2021-10-03 MED ORDER — MOLNUPIRAVIR EUA 200MG CAPSULE: 4.0000 | ORAL_CAPSULE | Freq: Two times a day (BID) | ORAL | 0 refills | Status: AC

## 2021-10-03 NOTE — Telephone Encounter (Signed)
The COVID antiviral was sent to your pharmacy.   in the future she should call the office , do NOT [place] Mychart for such matters  Regards,   [name], MD

## 2021-12-24 ENCOUNTER — Ambulatory Visit (INDEPENDENT_AMBULATORY_CARE_PROVIDER_SITE_OTHER): Payer: Medicare Other

## 2021-12-24 VITALS — Ht 64.0 in | Wt 122.0 lb

## 2021-12-24 DIAGNOSIS — Z0000 Encounter for general adult medical examination without abnormal findings: Secondary | ICD-10-CM

## 2021-12-24 NOTE — Progress Notes (Addendum)
Subjective:   Taylor [name] is a 70 y.o. female who presents for Medicare Annual (Subsequent) preventive examination.  Review of Systems    No ROS.  Medicare Wellness Virtual Visit.  Visual/audio telehealth visit, UTA vital signs.   See social history for additional risk factors.   Cardiac Risk Factors include: advanced age (>60men, >34 women)     Objective:    Today's Vitals   12/24/21 0951  Weight: 122 lb (55.3 kg)  Height: 5\' 4"  (1.626 m)   Body mass index is 20.94 kg/m.  Advanced Directives 12/24/2021 12/21/2020 12/21/2019  Does Patient Have a Medical Advance Directive? Yes No Yes  Type of Paramedic of [place];Living will - [place];Living will  Does patient want to make changes to medical advance directive? No - Patient declined - No - Patient declined  Copy of [place] in Chart? No - copy requested - No - copy requested  Would patient like information on creating a medical advance directive? - No - Patient declined -   Current Medications (verified) Outpatient Encounter Medications as of 12/24/2021  Medication Sig   Calcium-Vitamin D-Vitamin K (VIACTIV PO) Take as directed   cholestyramine (QUESTRAN) 4 GM/DOSE powder USE 1 SCOOPFUL 3 TIMES DAILY WITH MEALS AS DIRECTED   rosuvastatin (CRESTOR) 10 MG tablet TAKE ONE TABLET BY MOUTH AT BEDTIME (Patient taking differently: Take 5 mg by mouth every other day.)   timolol (TIMOPTIC) 0.5 % ophthalmic solution    vitamin B-12 (CYANOCOBALAMIN) 1000 MCG tablet Take 1,000 mcg by mouth daily.   VITAMIN D, CHOLECALCIFEROL, PO Take one by mouth daily   No facility-administered encounter medications on file as of 12/24/2021.   Allergies (verified) Tramadol, Vancomycin, Amoxicillin-pot clavulanate, and Azithromycin   History: Past Medical History:  Diagnosis Date   Chest pain    Fever blister    Hyperlipidemia    Irritable bowel syndrome    Mitral valve prolapse     Normal cardiac stress test 2007   Osteoporosis    Pill esophagitis 10/23/2012   Secondary to alendronate. Workup included chest CT for PE given [place] travel to beach,  Symptoms  resolved.  Prior EGD from [facility] was already positive for esophgatis.     Past Surgical History:  Procedure Laterality Date   CATARACT EXTRACTION Left 07/2019   CHOLECYSTECTOMY  Approx - 2002   Family History  Problem Relation Age of Onset   Heart disease Father 73       massive AMI, now post CABG   Heart disease Brother 42       CABG   Multiple sclerosis Mother    Aortic aneurysm Paternal Grandfather        died of rupture   Breast cancer Neg Hx    Social History   Socioeconomic History   Marital status: Married    Spouse name: Not on file   Number of children: Not on file   Years of education: Not on file   Highest education level: Not on file  Occupational History   Not on file  Tobacco Use   Smoking status: Never   Smokeless tobacco: Never  Vaping Use   Vaping Use: Never used  Substance and Sexual Activity   Alcohol use: Yes    Alcohol/week: 3.0 standard drinks    Types: 3 Glasses of wine per week   Drug use: No   Sexual activity: Not on file  Other Topics Concern   Not  on file  Social History Narrative   Not on file   Social Determinants of Health   Financial Resource Strain: Low Risk    Difficulty of Paying Living Expenses: Not hard at all  Food Insecurity: No Food Insecurity   Worried About Charity fundraiser in the Last Year: Never true   Arboriculturist in the Last Year: Never true  Transportation Needs: No Transportation Needs   Lack of Transportation (Medical): No   Lack of Transportation (Non-Medical): No  Physical Activity: Sufficiently Active   Days of Exercise per Week: 5 days   Minutes of Exercise per Session: 120 min  Stress: No Stress Concern Present   Feeling of Stress : Not at all  Social Connections: Unknown   Frequency of Communication with Friends and  Family: More than three times a week   Frequency of Social Gatherings with Friends and Family: More than three times a week   Attends Religious Services: Not on Electrical engineer or Organizations: Not on file   Attends Archivist Meetings: Not on file   Marital Status: Married   Tobacco Counseling Counseling given: Not Answered   Clinical Intake:  Pre-visit preparation completed: Yes        Diabetes: No  How often do you need to have someone help you when you read instructions, pamphlets, or other written materials from your doctor or pharmacy?: 1 - Never  Interpreter Needed?: No    Activities of Daily Living In your present state of health, do you have any difficulty performing the following activities: 12/24/2021  Hearing? N  Vision? N  Difficulty concentrating or making decisions? N  Walking or climbing stairs? N  Dressing or bathing? N  Doing errands, shopping? N  Preparing Food and eating ? N  Using the Toilet? N  In the past six months, have you accidently leaked urine? N  Do you have problems with loss of bowel control? N  Managing your Medications? N  Managing your Finances? N  Housekeeping or managing your Housekeeping? N  Some recent data might be hidden   Patient Care Team: Crecencio Mc, MD as PCP - General (Internal Medicine)  Indicate any recent Medical Services you may have received from other than [facility] providers in the past year (date may be approximate).     Assessment:   This is a routine wellness examination for Taylor [name].  Virtual Visit via Telephone Note  I connected with  Taylor [name] on 12/24/21 at  9:45 AM EDT by telephone and verified that I am speaking with the correct person using two identifiers.  Persons participating in the virtual visit: patient/Nurse Health Advisor   I discussed the limitations, risks, security and privacy concerns of performing an evaluation and management service by telephone and the  availability of in person appointments. The patient expressed understanding and agreed to proceed.  Interactive audio and video telecommunications were attempted between this nurse and patient, however failed, due to patient having technical difficulties OR patient did not have access to video capability.  We continued and completed visit with audio only.  Some vital signs may be absent or patient reported.   Hearing/Vision screen Hearing Screening - Comments:: Patient is able to hear conversational tones without difficulty. No issues reported. Vision Screening - Comments:: Wears corrective lenses Cataract extraction They have seen their ophthalmologist in the last 12 months.   Dietary issues and exercise activities discussed: Current Exercise Habits: Home  exercise routine, Time (Minutes): > 60, Frequency (Times/Week): 5, Weekly Exercise (Minutes/Week): 0, Intensity: Mild Healthy diet  Good water intake   Goals Addressed               This Visit's Progress     Patient Stated     Maintain Healthy Lifestyle (pt-stated)        Stay active Healthy diet        Depression Screen PHQ 2/9 Scores 12/24/2021 07/03/2021 12/21/2020 12/21/2019 06/03/2018 05/28/2017 10/23/2012  PHQ - 2 Score 0 0 0 0 2 0 0  PHQ- 9 Score - - - - 5 3 -    Fall Risk Fall Risk  12/24/2021 07/03/2021 12/21/2020 06/15/2020 12/21/2019  Falls in the past year? 0 1 0 0 0  Number falls in past yr: 0 0 0 - -  Injury with Fall? - 1 0 - -  Risk for fall due to : - History of fall(s) - - -  Follow up Falls evaluation completed Falls evaluation completed Falls evaluation completed Falls evaluation completed Falls evaluation completed    [place]:  Home free of loose throw rugs in walkways, pet beds, electrical cords, etc? Yes  Adequate lighting in your home to reduce risk of falls? Yes   ASSISTIVE DEVICES UTILIZED TO PREVENT FALLS: Use of a cane, walker or w/c? No   TIMED UP AND  GO: Was the test performed? No .   Cognitive Function:  Patient is alert and oriented x3. Enjoys reading and other brain health activities.    6CIT Screen 12/21/2019  What Year? 0 points  What month? 0 points  What time? 0 points  Count back from 20 0 points  Months in reverse 0 points  Repeat phrase 0 points  Total Score 0   Immunizations Immunization History  Administered Date(s) Administered   Fluad Quad(high Dose 65+) 06/14/2019, 07/03/2021   Influenza Split 10/24/2011   Influenza, High Dose Seasonal PF 08/05/2018, 08/08/2020   Influenza-Unspecified 07/13/2016   PFIZER(Purple Top)SARS-COV-2 Vaccination 10/18/2019, 11/08/2019, 08/29/2020   Pneumococcal Conjugate-13 05/02/2016   Pneumococcal Polysaccharide-23 04/27/2012, 06/03/2018   Tdap 04/27/2013   Zoster, Live 04/27/2013   Shingrix Completed?: No.    Education has been provided regarding the importance of this vaccine. Patient has been advised to call insurance company to determine out of pocket expense if they have not yet received this vaccine. Advised may also receive vaccine at local pharmacy or Health Dept. Verbalized acceptance and understanding.  Screening Tests Health Maintenance  Topic Date Due   COVID-19 Vaccine (4 - Booster for Pfizer series) 01/09/2022 (Originally 10/24/2020)   Zoster Vaccines- Shingrix (1 of 2) 03/26/2022 (Originally 04/13/1970)   MAMMOGRAM  03/05/2022   COLONOSCOPY (Pts 45-25yrs Insurance coverage will need to be confirmed)  03/03/2023   TETANUS/TDAP  04/28/2023   Pneumonia Vaccine 61+ Years old  Completed   INFLUENZA VACCINE  Completed   DEXA SCAN  Completed   Hepatitis C Screening  Completed   HPV VACCINES  Aged Out   Health Maintenance There are no preventive care reminders to display for this patient.  Lung Cancer Screening: (Low Dose CT Chest recommended if Age 24-80 years, 30 pack-year currently smoking OR have quit w/in 15years.) does not qualify.   Vision Screening: Recommended  annual ophthalmology exams for early detection of glaucoma and other disorders of the eye.  Dental Screening: Recommended annual dental exams for proper oral hygiene  Community Resource Referral / Chronic Care  Management: CRR required this visit?  No   CCM required this visit?  No      Plan:   Keep all routine maintenance appointments.   I have personally reviewed and noted the following in the patient's chart:   Medical and social history Use of alcohol, tobacco or illicit drugs  Current medications and supplements including opioid prescriptions.  Functional ability and status Nutritional status Physical activity Advanced directives List of other physicians Hospitalizations, surgeries, and ER visits in previous 12 months Vitals Screenings to include cognitive, depression, and falls Referrals and appointments  In addition, I have reviewed and discussed with patient certain preventive protocols, quality metrics, and best practice recommendations. A written personalized care plan for preventive services as well as general preventive health recommendations were provided to patient via mychart.     OBrien-Blaney, Taylor Loseke L, LPN   [id number]      I have reviewed the above information and agree with above.   Deborra Medina, MD

## 2021-12-24 NOTE — Patient Instructions (Addendum)
[name] , Thank you for taking time to come for your Medicare Wellness Visit. I appreciate your ongoing commitment to your health goals. Please review the following plan we discussed and let me know if I can assist you in the future.   These are the goals we discussed:  Goals       Patient Stated     Follow up with Primary Care Provider (pt-stated)      As needed      Maintain Healthy Lifestyle (pt-stated)      Stay active Healthy diet         This is a list of the screening recommended for you and due dates:  Health Maintenance  Topic Date Due   COVID-19 Vaccine (4 - Booster for Pfizer series) 01/09/2022*   Zoster (Shingles) Vaccine (1 of 2) 03/26/2022*   Mammogram  03/05/2022   Colon Cancer Screening  03/03/2023   Tetanus Vaccine  04/28/2023   Pneumonia Vaccine  Completed   Flu Shot  Completed   DEXA scan (bone density measurement)  Completed   Hepatitis C Screening: USPSTF Recommendation to screen - Ages 35-79 yo.  Completed   HPV Vaccine  Aged Out  *Topic was postponed. The date shown is not the original due date.    Advanced directives: End of life planning; Advance aging; Advanced directives discussed.  Copy of current HCPOA/Living Will requested.    Conditions/risks identified: none new  Next appointment: Follow up in one year for your annual wellness visit    Preventive Care 65 Years and Older, Female Preventive care refers to lifestyle choices and visits with your health care provider that can promote health and wellness. What does preventive care include? A yearly physical exam. This is also called an annual well check. Dental exams once or twice a year. Routine eye exams. Ask your health care provider how often you should have your eyes checked. Personal lifestyle choices, including: Daily care of your teeth and gums. Regular physical activity. Eating a healthy diet. Avoiding tobacco and drug use. Limiting alcohol use. Practicing safe sex. Taking  low-dose aspirin every day. Taking vitamin and mineral supplements as recommended by your health care provider. What happens during an annual well check? The services and screenings done by your health care provider during your annual well check will depend on your age, overall health, lifestyle risk factors, and family history of disease. Counseling  Your health care provider may ask you questions about your: Alcohol use. Tobacco use. Drug use. Emotional well-being. Home and relationship well-being. Sexual activity. Eating habits. History of falls. Memory and ability to understand (cognition). Work and work Statistician. Reproductive health. Screening  You may have the following tests or measurements: Height, weight, and BMI. Blood pressure. Lipid and cholesterol levels. These may be checked every 5 years, or more frequently if you are over 58 years old. Skin check. Lung cancer screening. You may have this screening every year starting at age 59 if you have a 30-pack-year history of smoking and currently smoke or have quit within the past 15 years. Fecal occult blood test (FOBT) of the stool. You may have this test every year starting at age 55. Flexible sigmoidoscopy or colonoscopy. You may have a sigmoidoscopy every 5 years or a colonoscopy every 10 years starting at age 11. Hepatitis C blood test. Hepatitis B blood test. Sexually transmitted disease (STD) testing. Diabetes screening. This is done by checking your blood sugar (glucose) after you have not eaten for a  while (fasting). You may have this done every 1-3 years. Bone density scan. This is done to screen for osteoporosis. You may have this done starting at age 75. Mammogram. This may be done every 1-2 years. Talk to your health care provider about how often you should have regular mammograms. Talk with your health care provider about your test results, treatment options, and if necessary, the need for more tests. Vaccines   Your health care provider may recommend certain vaccines, such as: Influenza vaccine. This is recommended every year. Tetanus, diphtheria, and acellular pertussis (Tdap, Td) vaccine. You may need a Td booster every 10 years. Zoster vaccine. You may need this after age 67. Pneumococcal 13-valent conjugate (PCV13) vaccine. One dose is recommended after age 17. Pneumococcal polysaccharide (PPSV23) vaccine. One dose is recommended after age 15. Talk to your health care provider about which screenings and vaccines you need and how often you need them. This information is not intended to replace advice given to you by your health care provider. Make sure you discuss any questions you have with your health care provider. Document Released: 10/27/2015 Document Revised: 06/19/2016 Document Reviewed: 08/01/2015 Elsevier Interactive Patient Education  2017 [place] Prevention in the Home Falls can cause injuries. They can happen to people of all ages. There are many things you can do to make your home safe and to help prevent falls. What can I do on the outside of my home? Regularly fix the edges of walkways and driveways and fix any cracks. Remove anything that might make you trip as you walk through a door, such as a raised step or threshold. Trim any bushes or trees on the path to your home. Use bright outdoor lighting. Clear any walking paths of anything that might make someone trip, such as rocks or tools. Regularly check to see if handrails are loose or broken. Make sure that both sides of any steps have handrails. Any raised decks and porches should have guardrails on the edges. Have any leaves, snow, or ice cleared regularly. Use sand or salt on walking paths during winter. Clean up any spills in your garage right away. This includes oil or grease spills. What can I do in the bathroom? Use night lights. Install grab bars by the toilet and in the tub and shower. Do not use towel  bars as grab bars. Use non-skid mats or decals in the tub or shower. If you need to sit down in the shower, use a plastic, non-slip stool. Keep the floor dry. Clean up any water that spills on the floor as soon as it happens. Remove soap buildup in the tub or shower regularly. Attach bath mats securely with double-sided non-slip rug tape. Do not have throw rugs and other things on the floor that can make you trip. What can I do in the bedroom? Use night lights. Make sure that you have a light by your bed that is easy to reach. Do not use any sheets or blankets that are too big for your bed. They should not hang down onto the floor. Have a firm chair that has side arms. You can use this for support while you get dressed. Do not have throw rugs and other things on the floor that can make you trip. What can I do in the kitchen? Clean up any spills right away. Avoid walking on wet floors. Keep items that you use a lot in easy-to-reach places. If you need to reach something above you,  use a strong step stool that has a grab bar. Keep electrical cords out of the way. Do not use floor polish or wax that makes floors slippery. If you must use wax, use non-skid floor wax. Do not have throw rugs and other things on the floor that can make you trip. What can I do with my stairs? Do not leave any items on the stairs. Make sure that there are handrails on both sides of the stairs and use them. Fix handrails that are broken or loose. Make sure that handrails are as long as the stairways. Check any carpeting to make sure that it is firmly attached to the stairs. Fix any carpet that is loose or worn. Avoid having throw rugs at the top or bottom of the stairs. If you do have throw rugs, attach them to the floor with carpet tape. Make sure that you have a light switch at the top of the stairs and the bottom of the stairs. If you do not have them, ask someone to add them for you. What else can I do to help  prevent falls? Wear shoes that: Do not have high heels. Have rubber bottoms. Are comfortable and fit you well. Are closed at the toe. Do not wear sandals. If you use a stepladder: Make sure that it is fully opened. Do not climb a closed stepladder. Make sure that both sides of the stepladder are locked into place. Ask someone to hold it for you, if possible. Clearly mark and make sure that you can see: Any grab bars or handrails. First and last steps. Where the edge of each step is. Use tools that help you move around (mobility aids) if they are needed. These include: Canes. Walkers. Scooters. Crutches. Turn on the lights when you go into a dark area. Replace any light bulbs as soon as they burn out. Set up your furniture so you have a clear path. Avoid moving your furniture around. If any of your floors are uneven, fix them. If there are any pets around you, be aware of where they are. Review your medicines with your doctor. Some medicines can make you feel dizzy. This can increase your chance of falling. Ask your doctor what other things that you can do to help prevent falls. This information is not intended to replace advice given to you by your health care provider. Make sure you discuss any questions you have with your health care provider. Document Released: 07/27/2009 Document Revised: 03/07/2016 Document Reviewed: 11/04/2014 Elsevier Interactive Patient Education  2017 [employer].

## 2022-02-04 ENCOUNTER — Other Ambulatory Visit: Payer: Self-pay | Admitting: Internal Medicine

## 2022-02-04 DIAGNOSIS — Z1231 Encounter for screening mammogram for malignant neoplasm of breast: Secondary | ICD-10-CM

## 2022-03-06 ENCOUNTER — Ambulatory Visit
Admission: RE | Admit: 2022-03-06 | Discharge: 2022-03-06 | Disposition: A | Discharge status: Home / Self Care | Payer: Medicare Other | Source: Ambulatory Visit | Attending: Internal Medicine | Admitting: Internal Medicine

## 2022-03-06 DIAGNOSIS — Z1231 Encounter for screening mammogram for malignant neoplasm of breast: Secondary | ICD-10-CM | POA: Diagnosis not present

## 2022-04-08 ENCOUNTER — Ambulatory Visit
Admission: RE | Admit: 2022-04-08 | Discharge: 2022-04-08 | Disposition: A | Discharge status: Home / Self Care | Payer: Medicare Other | Source: Ambulatory Visit | Attending: Internal Medicine | Admitting: Internal Medicine

## 2022-04-08 DIAGNOSIS — Z780 Asymptomatic menopausal state: Secondary | ICD-10-CM

## 2022-06-04 ENCOUNTER — Other Ambulatory Visit: Payer: Self-pay | Admitting: Internal Medicine

## 2022-06-04 DIAGNOSIS — Z1159 Encounter for screening for other viral diseases: Secondary | ICD-10-CM

## 2022-06-26 ENCOUNTER — Ambulatory Visit: Payer: Medicare Other | Admitting: Internal Medicine

## 2022-07-30 ENCOUNTER — Other Ambulatory Visit: Payer: Self-pay | Admitting: Internal Medicine

## 2022-07-31 ENCOUNTER — Ambulatory Visit (INDEPENDENT_AMBULATORY_CARE_PROVIDER_SITE_OTHER): Payer: Medicare Other | Admitting: Internal Medicine

## 2022-07-31 ENCOUNTER — Encounter: Payer: Self-pay | Admitting: Internal Medicine

## 2022-07-31 VITALS — BP 130/78 | HR 79 | Temp 97.8°F | Resp 14 | Ht 64.5 in | Wt 123.0 lb

## 2022-07-31 DIAGNOSIS — M8000XS Age-related osteoporosis with current pathological fracture, unspecified site, sequela: Secondary | ICD-10-CM | POA: Diagnosis not present

## 2022-07-31 DIAGNOSIS — F40243 Fear of flying: Secondary | ICD-10-CM

## 2022-07-31 DIAGNOSIS — Z23 Encounter for immunization: Secondary | ICD-10-CM

## 2022-07-31 DIAGNOSIS — E785 Hyperlipidemia, unspecified: Secondary | ICD-10-CM

## 2022-07-31 DIAGNOSIS — K915 Postcholecystectomy syndrome: Secondary | ICD-10-CM

## 2022-07-31 DIAGNOSIS — E559 Vitamin D deficiency, unspecified: Secondary | ICD-10-CM | POA: Diagnosis not present

## 2022-07-31 MED ORDER — DIAZEPAM 5 MG PO TABS: 5.0000 mg | ORAL_TABLET | Freq: Every day | ORAL | 0 refills | Status: DC | PRN

## 2022-07-31 MED ORDER — ZOSTER VAC RECOMB ADJUVANTED 50 MCG/0.5ML IM SUSR: 0.5000 mL | Freq: Once | INTRAMUSCULAR | 1 refills | Status: AC

## 2022-07-31 MED ORDER — RSVPREF3 VAC RECOMB ADJUVANTED 120 MCG/0.5ML IM SUSR: 0.5000 mL | Freq: Once | INTRAMUSCULAR | 0 refills | Status: AC

## 2022-07-31 NOTE — Patient Instructions (Addendum)
Take a probiotic for 3 weeks since you recently had antibiotics   To treat  viral sinusitis :  Use Sudafed , Sudafed PE  or Afrin nasal spray for 5 days as your decongestant     You might want to try using Relaxium for insomnia  (as seen on TV commercials) . It is available through [employer] and contains all natural supplements:  Melatonin 5 mg  Chamomile 25 mg Passionflower extract 75 mg GABA 100 mg Ashwaganda extract 125 mg Magnesium citrate, glycinate, oxide (100 mg)  L tryptophan 500 mg Valerest (proprietary  ingredient ; probably valeria root extract)     I have sent valium 5 mg dose to your pharmacy for your upcoming flight

## 2022-07-31 NOTE — Progress Notes (Unsigned)
Patient ID: Taylor [name], female    DOB: Sep 19, 1951  Age: 71 y.o. MRN: [id number]  The patient is here for follow up and management of chronic and acute problems.   The risk factors are reflected in the social history.  The roster of all physicians providing medical care to patient - is listed in the Snapshot section of the chart.  Activities of daily living:  The patient is 100% independent in all ADLs: dressing, toileting, feeding as well as independent mobility  Home safety : The patient has smoke detectors in the home. They wear seatbelts.  There are no firearms at home. There is no violence in the home.   There is no risks for hepatitis, STDs or HIV. There is no   history of blood transfusion. They have no travel history to infectious disease endemic areas of the world.  The patient has seen their dentist in the last six month. They have seen their eye doctor in the last year. They admit to slight hearing difficulty with regard to whispered voices and some television programs.  They have deferred audiologic testing in the last year.  They do not  have excessive sun exposure. Discussed the need for sun protection: hats, long sleeves and use of sunscreen if there is significant sun exposure.   Diet: the importance of a healthy diet is discussed. They do have a healthy diet.  The benefits of regular aerobic exercise were discussed. She plays pickle ball for over an hour 5 days per week .   Depression screen: there are no signs or vegative symptoms of depression- irritability, change in appetite, anhedonia, sadness/tearfullness.  Cognitive assessment: the patient manages all their financial and personal affairs and is actively engaged. They could relate day,date,year and events; recalled 2/3 objects at 3 minutes; performed clock-face test normally.  The following portions of the patient's history were reviewed and updated as appropriate: allergies, current medications, past family history, past  medical history,  past surgical history, past social history  and problem list.  Visual acuity was not assessed per patient preference since she has regular follow up with her ophthalmologist. Hearing and body mass index were assessed and reviewed.   During the course of the visit the patient was educated and counseled about appropriate screening and preventive services including : fall prevention , diabetes screening, nutrition counseling, colorectal cancer screening, and recommended immunizations.    CC: The primary encounter diagnosis was Need for immunization against influenza. Diagnoses of Hyperlipidemia LDL goal <160, Vitamin D deficiency, Osteoporosis with current pathological fracture, unspecified osteoporosis type, sequela, Fear of flying, and Post-cholecystectomy syndrome were also pertinent to this visit.   1) had  A prolonged  URI  in September ,   lasted over 14 days ,  took 4 levaquin,  did not help.  Finally sought medical care and was prescribed cefidinir and prednisone which resolved sinusitis and otitis   2) TAKES CACLIUM  VITMA B12 AD COLLAGEN   3) taking crestor 5 mg every other day   History Tysha has a past medical history of Chest pain, Fever blister, Hyperlipidemia, Irritable bowel syndrome, Mitral valve prolapse, Normal cardiac stress test (2007), Osteoporosis, and Pill esophagitis (10/23/2012).   She has a past surgical history that includes Cholecystectomy (Approx - 2002) and Cataract extraction (Left, 07/2019).   Her family history includes Aortic aneurysm in her paternal grandfather; Heart disease (age of onset: 78) in her father; Heart disease (age of onset: 61) in her brother; Multiple sclerosis  in her mother.She reports that she has never smoked. She has never used smokeless tobacco. She reports current alcohol use of about 3.0 standard drinks of alcohol per week. She reports that she does not use drugs.  Outpatient Medications Prior to Visit  Medication Sig  Dispense Refill   Calcium-Vitamin D-Vitamin K (VIACTIV PO) Take as directed     cholestyramine (QUESTRAN) 4 GM/DOSE powder USE 1 SCOOPFUL 3 TIMES DAILY WITH MEALS AS DIRECTED 378 g 5   rosuvastatin (CRESTOR) 10 MG tablet TAKE ONE TABLET BY MOUTH AT BEDTIME (Patient taking differently: Take 5 mg by mouth daily.) 90 tablet 1   timolol (TIMOPTIC) 0.5 % ophthalmic solution      vitamin B-12 (CYANOCOBALAMIN) 1000 MCG tablet Take 1,000 mcg by mouth daily.     VITAMIN D, CHOLECALCIFEROL, PO Take one by mouth daily     No facility-administered medications prior to visit.    Review of Systems  Patient denies headache, fevers, malaise, unintentional weight loss, skin rash, eye pain, sinus congestion and sinus pain, sore throat, dysphagia,  hemoptysis , cough, dyspnea, wheezing, chest pain, palpitations, orthopnea, edema, abdominal pain, nausea, melena, diarrhea, constipation, flank pain, dysuria, hematuria, urinary  Frequency, nocturia, numbness, tingling, seizures,  Focal weakness, Loss of consciousness,  Tremor, insomnia, depression, anxiety, and suicidal ideation.     Objective:  BP 130/78   Pulse 79   Temp 97.8 F (36.6 C) (Oral)   Resp 14   Ht 5' 4.5" (1.638 m)   Wt 123 lb (55.8 kg)   SpO2 98%   BMI 20.79 kg/m   Physical Exam  General appearance: alert, cooperative and appears stated age Head: Normocephalic, without obvious abnormality, atraumatic Eyes: conjunctivae/corneas clear. PERRL, EOM's intact. Fundi benign. Ears: normal TM's and external ear canals both ears Nose: Nares normal. Septum midline. Mucosa normal. No drainage or sinus tenderness. Throat: lips, mucosa, and tongue normal; teeth and gums normal Neck: no adenopathy, no carotid bruit, no JVD, supple, symmetrical, trachea midline and thyroid not enlarged, symmetric, no tenderness/mass/nodules Lungs: clear to auscultation bilaterally Breasts: normal appearance, no masses or tenderness Heart: regular rate and rhythm, S1,  S2 normal, no murmur, click, rub or gallop Abdomen: soft, non-tender; bowel sounds normal; no masses,  no organomegaly Extremities: extremities normal, atraumatic, no cyanosis or edema Pulses: 2+ and symmetric Skin: Skin color, texture, turgor normal. No rashes or lesions Neurologic: Alert and oriented X 3, normal strength and tone. Normal symmetric reflexes. Normal coordination and gait.     Assessment & Plan:   Problem List Items Addressed This Visit     Fear of flying    Will need valium for upcoming trip       Relevant Medications   diazepam (VALIUM) 5 MG tablet   Hyperlipidemia LDL goal <160    Managed with cholestyramine, QOD rosuvastatin ; LDL is < 130 and LFTs are normal   Lab Results  Component Value Date   CHOL 225 (H) 07/31/2022   HDL 81.40 07/31/2022   LDLCALC 115 (H) 07/31/2022   LDLDIRECT 122.6 04/23/2013   TRIG 144.0 07/31/2022   CHOLHDL 3 07/31/2022   Lab Results  Component Value Date   ALT 13 07/31/2022   AST 22 07/31/2022   ALKPHOS 85 07/31/2022   BILITOT 0.6 07/31/2022         Relevant Orders   CBC with Differential/Platelet (Completed)   Comprehensive metabolic panel (Completed)   Lipid panel (Completed)   TSH (Completed)   Osteoporosis    Has  not had treatment in years due to intolerances and insurance non payments ,  t scores improving with exercise and calcium . Also taking collagen      Post-cholecystectomy syndrome    Diarrhea managed with Questran used daily       Other Visit Diagnoses     Need for immunization against influenza    -  Primary   Relevant Orders   Flu Vaccine QUAD High Dose(Fluad) (Completed)   Vitamin D deficiency       Relevant Orders   Vitamin D (25 hydroxy) (Completed)       I am having Taylor [name] start on Zoster Vaccine Adjuvanted, RSV vaccine recomb adjuvanted, and diazepam. I am also having her maintain her (VITAMIN D, CHOLECALCIFEROL, PO), Calcium-Vitamin D-Vitamin K (VIACTIV PO), timolol,  cyanocobalamin, rosuvastatin, and cholestyramine.  Meds ordered this encounter  Medications   Zoster Vaccine Adjuvanted [facility]) injection    Sig: Inject 0.5 mLs into the muscle once for 1 dose.    Dispense:  1 each    Refill:  1   RSV vaccine recomb adjuvanted (AREXVY) 120 MCG/0.5ML injection    Sig: Inject 0.5 mLs into the muscle once for 1 dose.    Dispense:  0.5 mL    Refill:  0   diazepam (VALIUM) 5 MG tablet    Sig: Take 1 tablet (5 mg total) by mouth daily as needed for anxiety.    Dispense:  10 tablet    Refill:  0    I provided  30 minutes of  face-to-face time during this encounter reviewing patient's current problems and past surgeries, labs and imaging studies, providing counseling on the above mentioned problems , and coordination  of care .   There are no discontinued medications.  Follow-up: Return in about 1 year (around 08/01/2023).   [name], MD

## 2022-07-31 NOTE — Assessment & Plan Note (Addendum)
Will need valium or alprazolam

## 2022-07-31 NOTE — Assessment & Plan Note (Addendum)
Has not had treatment in years due to intolerances and insurance non payments ,  t scores improving with exercise and calcium . Also taking collagen

## 2022-08-01 LAB — COMPREHENSIVE METABOLIC PANEL
ALT: 13 U/L (ref 0–35)
AST: 22 U/L (ref 0–37)
Albumin: 4.2 g/dL (ref 3.5–5.2)
Alkaline Phosphatase: 85 U/L (ref 39–117)
BUN: 18 mg/dL (ref 6–23)
CO2: 26 mEq/L (ref 19–32)
Calcium: 9.7 mg/dL (ref 8.4–10.5)
Chloride: 104 mEq/L (ref 96–112)
Creatinine, Ser: 0.81 mg/dL (ref 0.40–1.20)
GFR: 73.09 mL/min (ref >60.00)
Glucose, Bld: 84 mg/dL (ref 70–99)
Potassium: 4.1 mEq/L (ref 3.5–5.1)
Sodium: 138 mEq/L (ref 135–145)
Total Bilirubin: 0.6 mg/dL (ref 0.2–1.2)
Total Protein: 7 g/dL (ref 6.0–8.3)

## 2022-08-01 LAB — CBC WITH DIFFERENTIAL/PLATELET
Basophils Absolute: 0 10*3/uL (ref 0.0–0.1)
Basophils Relative: 0.6 % (ref 0.0–3.0)
Eosinophils Absolute: 0.1 10*3/uL (ref 0.0–0.7)
Eosinophils Relative: 1.9 % (ref 0.0–5.0)
HCT: 39.8 % (ref 36.0–46.0)
Hemoglobin: 13.1 g/dL (ref 12.0–15.0)
Lymphocytes Relative: 38.5 % (ref 12.0–46.0)
Lymphs Abs: 2.7 10*3/uL (ref 0.7–4.0)
MCHC: 32.9 g/dL (ref 30.0–36.0)
MCV: 96 fl (ref 78.0–100.0)
Monocytes Absolute: 0.9 10*3/uL (ref 0.1–1.0)
Monocytes Relative: 12.3 % — ABNORMAL HIGH (ref 3.0–12.0)
Neutro Abs: 3.3 10*3/uL (ref 1.4–7.7)
Neutrophils Relative %: 46.7 % (ref 43.0–77.0)
Platelets: 298 10*3/uL (ref 150.0–400.0)
RBC: 4.15 Mil/uL (ref 3.87–5.11)
RDW: 14.3 % (ref 11.5–15.5)
WBC: 7 10*3/uL (ref 4.0–10.5)

## 2022-08-01 LAB — LIPID PANEL
Cholesterol: 225 mg/dL — ABNORMAL HIGH (ref 0–200)
HDL: 81.4 mg/dL (ref >39.00)
LDL Cholesterol: 115 mg/dL — ABNORMAL HIGH (ref 0–99)
NonHDL: 143.52
Total CHOL/HDL Ratio: 3
Triglycerides: 144 mg/dL (ref 0.0–149.0)
VLDL: 28.8 mg/dL (ref 0.0–40.0)

## 2022-08-01 LAB — TSH: TSH: 2.62 u[IU]/mL (ref 0.35–5.50)

## 2022-08-01 LAB — VITAMIN D 25 HYDROXY (VIT D DEFICIENCY, FRACTURES): VITD: 26.84 ng/mL — ABNORMAL LOW (ref 30.00–100.00)

## 2022-08-01 NOTE — Assessment & Plan Note (Addendum)
Managed with cholestyramine, QOD rosuvastatin ; LDL is < 130 and LFTs are normal   Lab Results  Component Value Date   CHOL 225 (H) 07/31/2022   HDL 81.40 07/31/2022   LDLCALC 115 (H) 07/31/2022   LDLDIRECT 122.6 04/23/2013   TRIG 144.0 07/31/2022   CHOLHDL 3 07/31/2022   Lab Results  Component Value Date   ALT 13 07/31/2022   AST 22 07/31/2022   ALKPHOS 85 07/31/2022   BILITOT 0.6 07/31/2022

## 2022-08-01 NOTE — Assessment & Plan Note (Signed)
Diarrhea managed with Questran used daily

## 2022-08-21 ENCOUNTER — Telehealth: Payer: Self-pay

## 2022-08-21 NOTE — Telephone Encounter (Signed)
PA for Diazepam has been submitted on covermymeds.   

## 2022-08-22 NOTE — Telephone Encounter (Signed)
Approval received, sent patient a mychart message to notify her also,

## 2022-08-31 ENCOUNTER — Other Ambulatory Visit: Payer: Self-pay | Admitting: Family

## 2022-08-31 DIAGNOSIS — Z1159 Encounter for screening for other viral diseases: Secondary | ICD-10-CM

## 2022-09-26 ENCOUNTER — Encounter: Payer: Self-pay | Admitting: Family Medicine

## 2022-09-26 ENCOUNTER — Telehealth (INDEPENDENT_AMBULATORY_CARE_PROVIDER_SITE_OTHER): Payer: Medicare Other | Admitting: Family Medicine

## 2022-09-26 DIAGNOSIS — U071 COVID-19: Secondary | ICD-10-CM | POA: Diagnosis not present

## 2022-09-26 MED ORDER — NIRMATRELVIR/RITONAVIR (PAXLOVID)TABLET: 3.0000 | ORAL_TABLET | Freq: Two times a day (BID) | ORAL | 0 refills | Status: AC

## 2022-09-26 NOTE — Progress Notes (Signed)
Virtual Visit via Telephone Note  I connected with Taylor [name] on 09/26/22 at 12:40 PM EST by telephone and verified that I am speaking with the correct person using two identifiers.   I discussed the limitations of performing an evaluation and management service by telephone and requested permission for a phone visit. The patient expressed understanding and agreed to proceed.  Location patient:  [place] Location provider: work or home office Participants present for the call: patient, provider Patient did not have a visit with me in the prior 7 days to address this/these issue(s).   History of Present Illness:  Acute telemedicine visit for Covid19: -Onset: 2 days ago, got it from her husband, tested positive yesterday -Symptoms include: nasal congestion, fever last night, sinus pressure, ha, mild cough, bodyaches -Denies: CP, SOB, NVD -drinking fluids, able to get up and down -Has tried: -Pertinent past medical history: see below, has had covid once in the past, GFR was 73 in october -Pertinent medication allergies:  Allergies  Allergen Reactions   Tramadol Nausea And Vomiting   Vancomycin    Amoxicillin-Pot Clavulanate Nausea Only    Occasionally makes her have nausea   Azithromycin Rash  -COVID-19 vaccine status: Immunization History  Administered Date(s) Administered   Fluad Quad(high Dose 65+) 06/14/2019, 07/03/2021, 07/31/2022   Influenza Split 10/24/2011   Influenza, High Dose Seasonal PF 08/05/2018, 08/08/2020   Influenza-Unspecified 07/13/2016   PFIZER(Purple Top)SARS-COV-2 Vaccination 10/18/2019, 11/08/2019, 08/29/2020   Pneumococcal Conjugate-13 05/02/2016   Pneumococcal Polysaccharide-23 04/27/2012, 06/03/2018   Tdap 04/27/2013   Zoster, Live 04/27/2013      Past Medical History:  Diagnosis Date   Chest pain    Fever blister    Hyperlipidemia    Irritable bowel syndrome    Mitral valve prolapse    Normal cardiac stress test 2007   Osteoporosis    Pill  esophagitis 10/23/2012   Secondary to alendronate. Workup included chest CT for PE given [place] travel to beach,  Symptoms  resolved.  Prior EGD from [facility] was already positive for esophgatis.      Current Outpatient Medications on File Prior to Visit  Medication Sig Dispense Refill   Calcium-Vitamin D-Vitamin K (VIACTIV PO) Take as directed     cholestyramine (QUESTRAN) 4 GM/DOSE powder USE 1 SCOOPFUL 3 TIMES DAILY WITH MEALS AS DIRECTED 378 g 5   diazepam (VALIUM) 5 MG tablet Take 1 tablet (5 mg total) by mouth daily as needed for anxiety. 10 tablet 0   rosuvastatin (CRESTOR) 10 MG tablet TAKE ONE TABLET BY MOUTH AT BEDTIME (Patient taking differently: Take 5 mg by mouth daily.) 90 tablet 1   timolol (TIMOPTIC) 0.5 % ophthalmic solution      vitamin B-12 (CYANOCOBALAMIN) 1000 MCG tablet Take 1,000 mcg by mouth daily.     VITAMIN D, CHOLECALCIFEROL, PO Take one by mouth daily     No current facility-administered medications on file prior to visit.    Observations/Objective: Patient sounds cheerful and well on the phone. I do not appreciate any SOB. Speech and thought processing are grossly intact. Patient reported vitals:  Assessment and Plan:  COVID-19   Discussed treatment options, side effect and risk of drug interactions, ideal treatment window, potential complications, isolation and precautions for COVID-19.  Checked for/reviewed last GFR - listed in HPI if available.  After lengthy discussion, the patient opted for treatment with Paxlovid due to being higher risk for complications of covid or severe disease and other factors. Discussed EUA status of this  drug and the fact that there is preliminary limited knowledge of risks/interactions/side effects per EUA document vs possible benefits and precautions. This information was shared with patient during the visit and also was provided in patient instructions. She does not take valium, will hold statin and was told  about interaction  with timing of dose of cholestyramine. Also, advised that patient discuss risks/interactions and use with pharmacist/treatment team as well. Other symptomatic care measures summarized in patient instructions.  Advised to seek prompt virtual visit or in person care if worsening, new symptoms arise, or if is not improving with treatment as expected per our conversation of expected course. Discussed options for follow up care. Did let this patient know that I do telemedicine on Tuesdays and Thursdays for [facility] and those are the days I am logged into the system. Advised to schedule follow up visit with PCP, [place] virtual visits or [facility] if any further questions or concerns to avoid delays in care.   I discussed the assessment and treatment plan with the patient. The patient was provided an opportunity to ask questions and all were answered. The patient agreed with the plan and demonstrated an understanding of the instructions.    Follow Up Instructions:  I did not refer this patient for an OV with me in the next 24 hours for this/these issue(s).  I discussed the assessment and treatment plan with the patient. The patient was provided an opportunity to ask questions and all were answered. The patient agreed with the plan and demonstrated an understanding of the instructions.   I spent 16 minutes on the date of this visit in the care of this patient. See summary of tasks completed to properly care for this patient in the detailed notes above which also included counseling of above, review of PMH, medications, allergies, evaluation of the patient and ordering and/or  instructing patient on testing and care options.     Terressa Koyanagi, DO

## 2022-09-26 NOTE — Patient Instructions (Signed)
HOME CARE TIPS:   -I sent the medication(s) we discussed to your pharmacy: Meds ordered this encounter  Medications   nirmatrelvir/ritonavir EUA (PAXLOVID) 20 x 150 MG & 10 x 100MG  TABS    Sig: Take 3 tablets by mouth 2 (two) times daily for 5 days. (Take nirmatrelvir 150 mg two tablets twice daily for 5 days and ritonavir 100 mg one tablet twice daily for 5 days) Patient GFR is > 60    Dispense:  30 tablet    Refill:  0     -I sent in the Covid19 treatment or referral you requested per our discussion. Please see the information provided below and discuss further with the pharmacist/treatment team.  -If taking Paxlovid, please review all medications, supplement and over the counter drugs with your pharmacist and ask them to check for any interactions. Please make the following changes to your regular medications while taking Paxlovid: *Do not take valium with Paxlovid *HOLD your cholesterol medication Crestor(rosuvastain) for 8 days, can restart 3 days after finishing paxlovid *must hold your cholestyramine or space out dosing from the paxlovid  -there is a chance of rebound illness with covid after improving. This can happen whether or not you take an antiviral treatment. If you become sick again with covid after getting better, please schedule a follow up virtual visit and isolate again.  -can use tylenol if needed for fevers, aches and pains per instructions  -nasal saline sinus rinses twice daily  -stay hydrated, drink plenty of fluids and eat small healthy meals - avoid dairy   -follow up with your doctor in 2-3 days unless improving and feeling better  -stay home while sick, except to seek medical care. If you have COVID19, you will likely be contagious for 7-10 days. Flu or Influenza is likely contagious for about 7 days. Other respiratory viral infections remain contagious for 5-10+ days depending on the virus and many other factors. Wear a good mask that fits snugly (such as  N95 or KN95) if around others to reduce the risk of transmission.  It was nice to meet you today, and I really hope you are feeling better soon. I help [facility] out with telemedicine visits on Tuesdays and Thursdays and am happy to help if you need a follow up virtual visit on those days. Otherwise, if you have any concerns or questions following this visit please schedule a follow up visit with your Primary Care doctor or seek care at a local urgent care clinic to avoid delays in care.    Seek in person care or schedule a follow up video visit promptly if your symptoms worsen, new concerns arise or you are not improving with treatment. Call 911 and/or seek emergency care if your symptoms are severe or life threatening.    See the following link for the most recent information regarding Paxlovid:  [email or web address]   Nirmatrelvir; Ritonavir Tablets What is this medication? NIRMATRELVIR; RITONAVIR (NIR ma TREL vir; ri TOE na veer) treats mild to moderate COVID-19. It may help people who are at high risk of developing severe illness. This medication works by limiting the spread of the virus in your body. The FDA has allowed the emergency use of this medication. This medicine may be used for other purposes; ask your health care provider or pharmacist if you have questions. COMMON BRAND NAME(S): PAXLOVID What should I tell my care team before I take this medication? They need to know if you have any of these conditions: Any allergies  Any serious illness Kidney disease Liver disease An unusual or allergic reaction to nirmatrelvir, ritonavir, other medications, foods, dyes, or preservatives Pregnant or trying to get pregnant Breast-feeding How should I use this medication? This product contains 2 different medications that are packaged together. For the standard dose, take 2 pink tablets of nirmatrelvir with 1 white tablet of ritonavir (3 tablets total) by mouth with water twice daily. Talk to  your care team if you have kidney disease. You may need a different dose. Swallow the tablets whole. You can take it with or without food. If it upsets your stomach, take it with food. Take all of this medication unless your care team tells you to stop it early. Keep taking it even if you think you are better. Talk to your care team about the use of this medication in children. While it may be prescribed for children as young as 12 years for selected conditions, precautions do apply. Overdosage: If you think you have taken too much of this medicine contact a poison control center or emergency room at once. NOTE: This medicine is only for you. Do not share this medicine with others. What if I miss a dose? If you miss a dose, take it as soon as you can unless it is more than 8 hours late. If it is more than 8 hours late, skip the missed dose. Take the next dose at the normal time. Do not take extra or 2 doses at the same time to make up for the missed dose. What may interact with this medication? Do not take this medication with any of the following medications: Alfuzosin Certain medications for anxiety or sleep like midazolam, triazolam Certain medications for cancer like apalutamide, enzalutamide Certain medications for cholesterol like lovastatin, simvastatin Certain medications for irregular heart beat like amiodarone, dronedarone, flecainide, propafenone, quinidine Certain medications for pain like meperidine, piroxicam Certain medications for psychotic disorders like clozapine, lurasidone, pimozide Certain medications for seizures like carbamazepine, phenobarbital, phenytoin Colchicine Eletriptan Eplerenone Ergot alkaloids like dihydroergotamine, ergonovine, ergotamine, methylergonovine Finerenone Flibanserin Ivabradine Lomitapide Naloxegol Ranolazine Rifampin Sildenafil Silodosin St. John's Wort Tolvaptan Ubrogepant Voclosporin This medication may also interact with the  following medications: Bedaquiline Birth control pills Bosentan Certain antibiotics like erythromycin or clarithromycin Certain medications for blood pressure like amlodipine, diltiazem, felodipine, nicardipine, nifedipine Certain medications for cancer like abemaciclib, ceritinib, dasatinib, encorafenib, ibrutinib, ivosidenib, neratinib, nilotinib, venetoclax, vinblastine, vincristine Certain medications for cholesterol like atorvastatin, rosuvastatin Certain medications for depression like bupropion, trazodone Certain medications for fungal infections like isavuconazonium, itraconazole, ketoconazole, voriconazole Certain medications for hepatitis C like elbasvir; grazoprevir, dasabuvir; ombitasvir; paritaprevir; ritonavir, glecaprevir; pibrentasvir, sofosbuvir; velpatasvir; voxilaprevir Certain medications for HIV or AIDS Certain medications for irregular heartbeat like lidocaine Certain medications that treat or prevent blood clots like rivaroxaban, warfarin Digoxin Fentanyl Medications that lower your chance of fighting infection like cyclosporine, sirolimus, tacrolimus Methadone Quetiapine Rifabutin Salmeterol Steroid medications like betamethasone, budesonide, ciclesonide, dexamethasone, fluticasone, methylprednisone, mometasone, triamcinolone This list may not describe all possible interactions. Give your health care provider a list of all the medicines, herbs, non-prescription drugs, or dietary supplements you use. Also tell them if you smoke, drink alcohol, or use illegal drugs. Some items may interact with your medicine. What should I watch for while using this medication? Your condition will be monitored carefully while you are receiving this medication. Visit your care team for regular checkups. Tell your care team if your symptoms do not start to get better or if they get worse. If  you have untreated HIV infection, this medication may lead to some HIV medications not working as  well in the future. Birth control may not work properly while you are taking this medication. Talk to your care team about using an extra method of birth control. What side effects may I notice from receiving this medication? Side effects that you should report to your care team as soon as possible: Allergic reactions--skin rash, itching, hives, swelling of the face, lips, tongue, or throat Liver injury--right upper belly pain, loss of appetite, nausea, light-colored stool, dark yellow or brown urine, yellowing skin or eyes, unusual weakness or fatigue Redness, blistering, peeling, or loosening of the skin, including inside the mouth Side effects that usually do not require medical attention (report these to your care team if they continue or are bothersome): Change in taste Diarrhea General discomfort and fatigue Increase in blood pressure Muscle pain Nausea Stomach pain This list may not describe all possible side effects. Call your doctor for medical advice about side effects. You may report side effects to FDA at 1-800-FDA-1088. Where should I keep my medication? Keep out of the reach of children and pets. Store at room temperature between 20 and 25 degrees C (68 and 77 degrees F). Get rid of any unused medication after the expiration date. To get rid of medications that are no longer needed or have expired: Take the medication to a medication take-back program. Check with your pharmacy or law enforcement to find a location. If you cannot return the medication, check the label or package insert to see if the medication should be thrown out in the garbage or flushed down the toilet. If you are not sure, ask your care team. If it is safe to put it in the trash, take the medication out of the container. Mix the medication with cat litter, dirt, coffee grounds, or other unwanted substance. Seal the mixture in a bag or container. Put it in the trash. NOTE: This sheet is a summary. It may not  cover all possible information. If you have questions about this medicine, talk to your doctor, pharmacist, or health care provider.  2022 Elsevier/Gold Standard (2021-07-02 00:00:00)

## 2022-11-14 DIAGNOSIS — H524 Presbyopia: Secondary | ICD-10-CM | POA: Diagnosis not present

## 2022-11-14 DIAGNOSIS — H401131 Primary open-angle glaucoma, bilateral, mild stage: Secondary | ICD-10-CM | POA: Diagnosis not present

## 2022-12-04 ENCOUNTER — Emergency Department: Payer: Medicare Other

## 2022-12-04 ENCOUNTER — Emergency Department
Admission: EM | Admit: 2022-12-04 | Discharge: 2022-12-04 | Disposition: A | Discharge status: Home / Self Care | Payer: Medicare Other | Attending: Emergency Medicine | Admitting: Emergency Medicine

## 2022-12-04 ENCOUNTER — Other Ambulatory Visit: Payer: Self-pay

## 2022-12-04 DIAGNOSIS — R9431 Abnormal electrocardiogram [ECG] [EKG]: Secondary | ICD-10-CM | POA: Diagnosis not present

## 2022-12-04 DIAGNOSIS — R202 Paresthesia of skin: Secondary | ICD-10-CM | POA: Insufficient documentation

## 2022-12-04 DIAGNOSIS — R200 Anesthesia of skin: Secondary | ICD-10-CM | POA: Diagnosis not present

## 2022-12-04 LAB — URINALYSIS, ROUTINE W REFLEX MICROSCOPIC
Bacteria, UA: NONE SEEN
Bilirubin Urine: NEGATIVE
Glucose, UA: NEGATIVE mg/dL
Ketones, ur: NEGATIVE mg/dL
Nitrite: NEGATIVE
Protein, ur: NEGATIVE mg/dL
Specific Gravity, Urine: 1.006 (ref 1.005–1.030)
pH: 5 (ref 5.0–8.0)

## 2022-12-04 LAB — BASIC METABOLIC PANEL
Anion gap: 5 (ref 5–15)
BUN: 16 mg/dL (ref 8–23)
CO2: 27 mmol/L (ref 22–32)
Calcium: 9.3 mg/dL (ref 8.9–10.3)
Chloride: 104 mmol/L (ref 98–111)
Creatinine, Ser: 0.89 mg/dL (ref 0.44–1.00)
GFR, Estimated: >60 mL/min (ref >60)
Glucose, Bld: 99 mg/dL (ref 70–99)
Potassium: 4.2 mmol/L (ref 3.5–5.1)
Sodium: 136 mmol/L (ref 135–145)

## 2022-12-04 LAB — CBC
HCT: 44 % (ref 36.0–46.0)
Hemoglobin: 14.3 g/dL (ref 12.0–15.0)
MCH: 31.1 pg (ref 26.0–34.0)
MCHC: 32.5 g/dL (ref 30.0–36.0)
MCV: 95.7 fL (ref 80.0–100.0)
Platelets: 303 10*3/uL (ref 150–400)
RBC: 4.6 MIL/uL (ref 3.87–5.11)
RDW: 13.5 % (ref 11.5–15.5)
WBC: 6.8 10*3/uL (ref 4.0–10.5)
nRBC: 0 % (ref 0.0–0.2)

## 2022-12-04 LAB — TROPONIN I (HIGH SENSITIVITY): Troponin I (High Sensitivity): 4 ng/L (ref <18)

## 2022-12-04 NOTE — ED Provider Notes (Signed)
[facility] Provider Note    Event Date/Time   First MD Initiated Contact with Patient 12/04/22 1504     (approximate)   History   Chief Complaint: Numbness   HPI  Taylor [name] is a 72 y.o. female with a history of IBS, GERD who comes ED complaining of left arm paresthesia that has been intermittent over the last 2 days, occurring once a day lasting 5 to 10 minutes.  No chest pain shortness of breath or vomiting.  Radiating.  No exertional symptoms.  Not pleuritic.  Able to do strenuous activity without any symptoms.  No trauma.  Most recently today had an episode on waking up this morning that lasted about 5 minutes and was associated with diaphoresis.  Symptoms currently resolved.     Physical Exam   Triage Vital Signs: ED Triage Vitals  Enc Vitals Group     BP 12/04/22 1126 (!) 144/100     Pulse Rate 12/04/22 1126 76     Resp 12/04/22 1126 18     Temp 12/04/22 1126 98 F (36.7 C)     Temp Source 12/04/22 1126 Oral     SpO2 12/04/22 1126 98 %     Weight 12/04/22 1127 123 lb 0.3 oz (55.8 kg)     Height 12/04/22 1127 5' 4.5" (1.638 m)     Head Circumference --      Peak Flow --      Pain Score 12/04/22 1127 0     Pain Loc --      Pain Edu? --      Excl. in [place]? --     Most recent vital signs: Vitals:   12/04/22 1126 12/04/22 1503  BP: (!) 144/100 137/83  Pulse: 76 62  Resp: 18 18  Temp: 98 F (36.7 C)   SpO2: 98% 100%    General: Awake, no distress.  CV:  Good peripheral perfusion.  Regular rate and rhythm.  Normal radial and ulnar pulses Resp:  Normal effort.  Clear to auscultation bilaterally Abd:  No distention.  Other:  No lower extremity edema.  Neurologically intact.  Full range of motion in all extremities, no focal tenderness or inflammatory soft tissue changes.   ED Results / Procedures / Treatments   Labs (all labs ordered are listed, but only abnormal results are displayed) Labs Reviewed  URINALYSIS, ROUTINE W REFLEX  MICROSCOPIC - Abnormal; Notable for the following components:      Result Value   Color, Urine STRAW (*)    APPearance CLEAR (*)    Hgb urine dipstick SMALL (*)    Leukocytes,Ua TRACE (*)    All other components within normal limits  BASIC METABOLIC PANEL  CBC  CBG MONITORING, ED  TROPONIN I (HIGH SENSITIVITY)     EKG Interpreted by me Normal sinus rhythm rate of 64.  Normal axis intervals QRS ST segments and T waves.   RADIOLOGY CT head interpreted by me, no evidence of infarct, mass, or hemorrhage.  Radiology report reviewed   PROCEDURES:  Procedures   MEDICATIONS ORDERED IN ED: Medications - No data to display   IMPRESSION / MDM / [place] / ED COURSE  I reviewed the triage vital signs and the nursing notes.  DDx: Peripheral nerve compression, muscle spasm, electrolyte abnormality, anemia, intracranial mass, low suspicion non-STEMI.  Doubt PE dissection AAA pneumothorax pericarditis pericardial effusion or infection  Patient's presentation is most consistent with acute presentation with potential threat to  life or bodily function.  Patient presents with brief intermittent episodes of left arm paresthesia without motor weakness, without any other neurologic symptoms.  Not consistent with TIA/CVA.  Vital signs and exam are unremarkable.  Symptoms are currently resolved.  EKG, CT head, labs all unremarkable.  High-sensitivity troponin is normal.  With these atypical symptoms, patient does not require admission.  She is appropriate for discharge and outpatient follow-up with primary care.       FINAL CLINICAL IMPRESSION(S) / ED DIAGNOSES   Final diagnoses:  Paresthesia     Rx / DC Orders   ED Discharge Orders     None        Note:  This document was prepared using Dragon voice recognition software and may include unintentional dictation errors.   Carrie Mew, MD 12/04/22 [phone]

## 2022-12-04 NOTE — ED Notes (Signed)
EKG completed in triage.

## 2022-12-04 NOTE — ED Notes (Signed)
Pt verbalizes understanding of discharge instructions. Opportunity for questioning and answers were provided. Pt discharged from ED to home with husband.

## 2022-12-04 NOTE — ED Notes (Signed)
Pt reports numbness in left posterior upper arm this morning. Denies numbness at this time. Pt A&Ox4 at time of assessment.

## 2022-12-04 NOTE — Discharge Instructions (Signed)
Your EKG, CT scan of the head, and lab tests were all okay today.  Continue monitoring your symptoms and follow-up with your doctor for further evaluation.

## 2022-12-04 NOTE — ED Triage Notes (Signed)
Pt here with left arm numbness and nausea that started yesterday for about 10 mins. Pt states she was also diaphoretic but did not think it was anything serious. Pt states she woke up today with the same feeling. Pt states the arm numbness continues today but denies any other pain.

## 2022-12-18 ENCOUNTER — Telehealth: Payer: Self-pay | Admitting: Internal Medicine

## 2022-12-18 NOTE — Telephone Encounter (Signed)
Contacted Taylor [name] to schedule their annual wellness visit. Appointment made for 12/30/2022.  Thank you,  [place] Direct dial  [phone]

## 2023-01-06 ENCOUNTER — Ambulatory Visit (INDEPENDENT_AMBULATORY_CARE_PROVIDER_SITE_OTHER): Payer: Medicare Other

## 2023-01-06 VITALS — Ht 64.5 in | Wt 123.0 lb

## 2023-01-06 DIAGNOSIS — Z0000 Encounter for general adult medical examination without abnormal findings: Secondary | ICD-10-CM

## 2023-01-06 NOTE — Patient Instructions (Addendum)
Taylor [name] , Thank you for taking time to come for your Medicare Wellness Visit. I appreciate your ongoing commitment to your health goals. Please review the following plan we discussed and let me know if I can assist you in the future.   These are the goals we discussed:  Goals       Patient Stated     Follow up with Primary Care Provider (pt-stated)      As needed      Maintain Healthy Lifestyle (pt-stated)      Stay active Healthy diet Stay hydrated         This is a list of the screening recommended for you and due dates:  Health Maintenance  Topic Date Due   COVID-19 Vaccine (4 - 2023-24 season) 01/22/2023*   Zoster (Shingles) Vaccine (1 of 2) 04/08/2023*   Colon Cancer Screening  03/03/2023   Mammogram  03/07/2023   DTaP/Tdap/Td vaccine (2 - Td or Tdap) 04/28/2023   Medicare Annual Wellness Visit  01/06/2024   Pneumonia Vaccine  Completed   Flu Shot  Completed   DEXA scan (bone density measurement)  Completed   Hepatitis C Screening: USPSTF Recommendation to screen - Ages 81-79 yo.  Completed   HPV Vaccine  Aged Out  *Topic was postponed. The date shown is not the original due date.   Conditions/risks identified: none new  Next appointment: Follow up in one year for your annual wellness visit    Preventive Care 65 Years and Older, Female Preventive care refers to lifestyle choices and visits with your health care provider that can promote health and wellness. What does preventive care include? A yearly physical exam. This is also called an annual well check. Dental exams once or twice a year. Routine eye exams. Ask your health care provider how often you should have your eyes checked. Personal lifestyle choices, including: Daily care of your teeth and gums. Regular physical activity. Eating a healthy diet. Avoiding tobacco and drug use. Limiting alcohol use. Practicing safe sex. Taking low-dose aspirin every day. Taking vitamin and mineral supplements as  recommended by your health care provider. What happens during an annual well check? The services and screenings done by your health care provider during your annual well check will depend on your age, overall health, lifestyle risk factors, and family history of disease. Counseling  Your health care provider may ask you questions about your: Alcohol use. Tobacco use. Drug use. Emotional well-being. Home and relationship well-being. Sexual activity. Eating habits. History of falls. Memory and ability to understand (cognition). Work and work Statistician. Reproductive health. Screening  You may have the following tests or measurements: Height, weight, and BMI. Blood pressure. Lipid and cholesterol levels. These may be checked every 5 years, or more frequently if you are over 70 years old. Skin check. Lung cancer screening. You may have this screening every year starting at age 67 if you have a 30-pack-year history of smoking and currently smoke or have quit within the past 15 years. Fecal occult blood test (FOBT) of the stool. You may have this test every year starting at age 66. Flexible sigmoidoscopy or colonoscopy. You may have a sigmoidoscopy every 5 years or a colonoscopy every 10 years starting at age 7. Hepatitis C blood test. Hepatitis B blood test. Sexually transmitted disease (STD) testing. Diabetes screening. This is done by checking your blood sugar (glucose) after you have not eaten for a while (fasting). You may have this done every 1-3 years.  Bone density scan. This is done to screen for osteoporosis. You may have this done starting at age 29. Mammogram. This may be done every 1-2 years. Talk to your health care provider about how often you should have regular mammograms. Talk with your health care provider about your test results, treatment options, and if necessary, the need for more tests. Vaccines  Your health care provider may recommend certain vaccines, such  as: Influenza vaccine. This is recommended every year. Tetanus, diphtheria, and acellular pertussis (Tdap, Td) vaccine. You may need a Td booster every 10 years. Zoster vaccine. You may need this after age 51. Pneumococcal 13-valent conjugate (PCV13) vaccine. One dose is recommended after age 83. Pneumococcal polysaccharide (PPSV23) vaccine. One dose is recommended after age 34. Talk to your health care provider about which screenings and vaccines you need and how often you need them. This information is not intended to replace advice given to you by your health care provider. Make sure you discuss any questions you have with your health care provider. Document Released: 10/27/2015 Document Revised: 06/19/2016 Document Reviewed: 08/01/2015 Elsevier Interactive Patient Education  2017 [place] Prevention in the Home Falls can cause injuries. They can happen to people of all ages. There are many things you can do to make your home safe and to help prevent falls. What can I do on the outside of my home? Regularly fix the edges of walkways and driveways and fix any cracks. Remove anything that might make you trip as you walk through a door, such as a raised step or threshold. Trim any bushes or trees on the path to your home. Use bright outdoor lighting. Clear any walking paths of anything that might make someone trip, such as rocks or tools. Regularly check to see if handrails are loose or broken. Make sure that both sides of any steps have handrails. Any raised decks and porches should have guardrails on the edges. Have any leaves, snow, or ice cleared regularly. Use sand or salt on walking paths during winter. Clean up any spills in your garage right away. This includes oil or grease spills. What can I do in the bathroom? Use night lights. Install grab bars by the toilet and in the tub and shower. Do not use towel bars as grab bars. Use non-skid mats or decals in the tub or  shower. If you need to sit down in the shower, use a plastic, non-slip stool. Keep the floor dry. Clean up any water that spills on the floor as soon as it happens. Remove soap buildup in the tub or shower regularly. Attach bath mats securely with double-sided non-slip rug tape. Do not have throw rugs and other things on the floor that can make you trip. What can I do in the bedroom? Use night lights. Make sure that you have a light by your bed that is easy to reach. Do not use any sheets or blankets that are too big for your bed. They should not hang down onto the floor. Have a firm chair that has side arms. You can use this for support while you get dressed. Do not have throw rugs and other things on the floor that can make you trip. What can I do in the kitchen? Clean up any spills right away. Avoid walking on wet floors. Keep items that you use a lot in easy-to-reach places. If you need to reach something above you, use a strong step stool that has a grab bar.  Keep electrical cords out of the way. Do not use floor polish or wax that makes floors slippery. If you must use wax, use non-skid floor wax. Do not have throw rugs and other things on the floor that can make you trip. What can I do with my stairs? Do not leave any items on the stairs. Make sure that there are handrails on both sides of the stairs and use them. Fix handrails that are broken or loose. Make sure that handrails are as long as the stairways. Check any carpeting to make sure that it is firmly attached to the stairs. Fix any carpet that is loose or worn. Avoid having throw rugs at the top or bottom of the stairs. If you do have throw rugs, attach them to the floor with carpet tape. Make sure that you have a light switch at the top of the stairs and the bottom of the stairs. If you do not have them, ask someone to add them for you. What else can I do to help prevent falls? Wear shoes that: Do not have high heels. Have  rubber bottoms. Are comfortable and fit you well. Are closed at the toe. Do not wear sandals. If you use a stepladder: Make sure that it is fully opened. Do not climb a closed stepladder. Make sure that both sides of the stepladder are locked into place. Ask someone to hold it for you, if possible. Clearly mark and make sure that you can see: Any grab bars or handrails. First and last steps. Where the edge of each step is. Use tools that help you move around (mobility aids) if they are needed. These include: Canes. Walkers. Scooters. Crutches. Turn on the lights when you go into a dark area. Replace any light bulbs as soon as they burn out. Set up your furniture so you have a clear path. Avoid moving your furniture around. If any of your floors are uneven, fix them. If there are any pets around you, be aware of where they are. Review your medicines with your doctor. Some medicines can make you feel dizzy. This can increase your chance of falling. Ask your doctor what other things that you can do to help prevent falls. This information is not intended to replace advice given to you by your health care provider. Make sure you discuss any questions you have with your health care provider. Document Released: 07/27/2009 Document Revised: 03/07/2016 Document Reviewed: 11/04/2014 Elsevier Interactive Patient Education  2017 [employer].

## 2023-01-06 NOTE — Progress Notes (Addendum)
Subjective:   Taylor [name] is a 72 y.o. female who presents for Medicare Annual (Subsequent) preventive examination.  [name] of Systems    No ROS.  Medicare Wellness Virtual Visit.  Visual/audio telehealth visit, UTA vital signs.   See social history for additional risk factors.   Cardiac Risk Factors include: advanced age (>52men, >20 women)     Objective:    Today's Vitals   01/06/23 1616  Weight: 123 lb (55.8 kg)  Height: 5' 4.5" (1.638 m)   Body mass index is 20.79 kg/m.     01/06/2023    4:18 PM 12/04/2022   11:28 AM 12/24/2021   10:13 AM 12/21/2020    9:36 AM 12/21/2019   11:31 AM  Advanced Directives  Does Patient Have a Medical Advance Directive? No No Yes No Yes  Type of Comptroller;Living will  [place];Living will  Does patient want to make changes to medical advance directive? No - Patient declined  No - Patient declined  No - Patient declined  Copy of [place] in Chart?   No - copy requested  No - copy requested  Would patient like information on creating a medical advance directive?  No - Patient declined  No - Patient declined     Current Medications (verified) Outpatient Encounter Medications as of 01/06/2023  Medication Sig   Calcium-Vitamin D-Vitamin K (VIACTIV PO) Take as directed   cholestyramine (QUESTRAN) 4 GM/DOSE powder USE 1 SCOOPFUL 3 TIMES DAILY WITH MEALS AS DIRECTED   rosuvastatin (CRESTOR) 10 MG tablet TAKE ONE TABLET BY MOUTH AT BEDTIME (Patient taking differently: Take 5 mg by mouth daily.)   timolol (TIMOPTIC) 0.5 % ophthalmic solution    vitamin B-12 (CYANOCOBALAMIN) 1000 MCG tablet Take 1,000 mcg by mouth daily.   VITAMIN D, CHOLECALCIFEROL, PO Take one by mouth daily   [DISCONTINUED] diazepam (VALIUM) 5 MG tablet Take 1 tablet (5 mg total) by mouth daily as needed for anxiety.   No facility-administered encounter medications on file as of 01/06/2023.     Allergies (verified) Tramadol, Vancomycin, Amoxicillin-pot clavulanate, and Azithromycin   History: Past Medical History:  Diagnosis Date   Chest pain    Fever blister    Hyperlipidemia    Irritable bowel syndrome    Mitral valve prolapse    Normal cardiac stress test 2007   Osteoporosis    Pill esophagitis 10/23/2012   Secondary to alendronate. Workup included chest CT for PE given recent travel to beach,  Symptoms  resolved.  Prior EGD from [facility] was already positive for esophgatis.     Past Surgical History:  Procedure Laterality Date   CATARACT EXTRACTION Left 07/2019   CHOLECYSTECTOMY  Approx - 2002   Family History  Problem Relation Age of Onset   Heart disease Father 57       massive AMI, now post CABG   Heart disease Brother 84       CABG   Multiple sclerosis Mother    Aortic aneurysm Paternal Grandfather        died of rupture   Breast cancer Neg Hx    Social History   Socioeconomic History   Marital status: Married    Spouse name: Not on file   Number of children: Not on file   Years of education: Not on file   Highest education level: Not on file  Occupational History   Not on file  Tobacco Use  Smoking status: Never   Smokeless tobacco: Never  Vaping Use   Vaping Use: Never used  Substance and Sexual Activity   Alcohol use: Yes    Alcohol/week: 3.0 standard drinks of alcohol    Types: 3 Glasses of wine per week   Drug use: No   Sexual activity: Not on file  Other Topics Concern   Not on file  Social History Narrative   Not on file   Social Determinants of Health   Financial Resource Strain: Low Risk  (01/05/2023)   Overall Financial Resource Strain (CARDIA)    Difficulty of Paying Living Expenses: Not hard at all  Food Insecurity: No Food Insecurity (01/05/2023)   Hunger Vital Sign    Worried About Running Out of Food in the Last Year: Never true    Ran Out of Food in the Last Year: Never true  Transportation Needs: No  Transportation Needs (01/05/2023)   PRAPARE - Hydrologist (Medical): No    Lack of Transportation (Non-Medical): No  Physical Activity: Sufficiently Active (01/05/2023)   Exercise Vital Sign    Days of Exercise per Week: 5 days    Minutes of Exercise per Session: 120 min  Stress: No Stress Concern Present (01/05/2023)   [place]    Feeling of Stress : Not at all  Social Connections: Unknown (01/05/2023)   Social Connection and Isolation Panel [NHANES]    Frequency of Communication with Friends and Family: More than three times a week    Frequency of Social Gatherings with Friends and Family: More than three times a week    Attends Religious Services: Not on Advertising copywriter or Organizations: Yes    Attends Music therapist: More than 4 times per year    Marital Status: Married    Tobacco Counseling Counseling given: Not Answered   Clinical Intake:  Pre-visit preparation completed: Yes        Diabetes: No  How often do you need to have someone help you when you read instructions, pamphlets, or other written materials from your doctor or pharmacy?: 1 - Never    Interpreter Needed?: No      Activities of Daily Living    01/05/2023    5:11 PM 12/29/2022    5:20 PM  In your present state of health, do you have any difficulty performing the following activities:  Hearing? 0 0  Vision? 0 0  Difficulty concentrating or making decisions? 0 0  Walking or climbing stairs? 0 0  Dressing or bathing? 0 0  Doing errands, shopping? 0 0  Preparing Food and eating ? N N  Using the Toilet? N N  In the past six months, have you accidently leaked urine? N N  Do you have problems with loss of bowel control? N N  Managing your Medications? N N  Managing your Finances? N N  Housekeeping or managing your Housekeeping? N N    Patient Care Team: Crecencio Mc,  MD as PCP - General (Internal Medicine)  Indicate any recent Medical Services you may have received from other than [facility] providers in the past year (date may be approximate).     Assessment:   This is a routine wellness examination for Taylor [name].  Patient Medicare AWV questionnaire was completed by the patient on 12/29/22 and 01/05/23, I have confirmed that all information answered by patient is correct and no  changes since this date.   I connected with  Taylor [name] on 01/06/23 by a audio enabled telemedicine application and verified that I am speaking with the correct person using two identifiers.  Patient Location: Home  Provider Location: Office/Clinic  I discussed the limitations of evaluation and management by telemedicine. The patient expressed understanding and agreed to proceed.   Hearing/Vision screen Hearing Screening - Comments:: Patient is able to hear conversational tones without difficulty. No issues reported. Vision Screening - Comments:: Wears one corrective contact lens  They have seen their ophthalmologist in the last 12 months.  Dietary issues and exercise activities discussed: Current Exercise Habits: Home exercise routine, Type of exercise: walking ([place]), Time (Minutes): > 60, Frequency (Times/Week): 5, Weekly Exercise (Minutes/Week): 0, Intensity: Mild Healthy diet Good water intake   Goals Addressed               This Visit's Progress     Patient Stated     Maintain Healthy Lifestyle (pt-stated)        Stay active Healthy diet Stay hydrated        Depression Screen    01/06/2023    4:10 PM 07/31/2022    2:10 PM 12/24/2021    9:57 AM 07/03/2021    1:09 PM 12/21/2020    9:37 AM 12/21/2019   11:12 AM 06/03/2018   10:34 AM  PHQ 2/9 Scores  PHQ - 2 Score 0 0 0 0 0 0 2  PHQ- 9 Score  2     5    Fall Risk    01/05/2023    5:11 PM 12/29/2022    5:20 PM 07/31/2022    2:10 PM 12/24/2021   10:15 AM 07/03/2021    1:09 PM  [place] in the  past year? 0 0 0 0 1  Number falls in past yr: 0 0 0 0 0  Injury with Fall? 0 0 0  1  Risk for fall due to :   No Fall Risks  History of fall(s)  Follow up Falls evaluation completed;Falls prevention discussed  Falls evaluation completed Falls evaluation completed Falls evaluation completed    FALL RISK PREVENTION PERTAINING TO THE HOME: Home free of loose throw rugs in walkways, pet beds, electrical cords, etc? Yes  Adequate lighting in your home to reduce risk of falls? Yes   ASSISTIVE DEVICES UTILIZED TO PREVENT FALLS: Life alert? No  Use of a cane, walker or w/c? No  Grab bars in the bathroom? No  Shower chair or bench in shower? No  Elevated toilet seat or a handicapped toilet? No   TIMED UP AND GO: Was the test performed? No .   Cognitive Function:  Patient is alert and oriented x3.  Manages her own finances and medications. 100% independent.       01/06/2023    4:16 PM 12/21/2019   11:32 AM  6CIT Screen  What Year?  0 points  What month?  0 points  What time?  0 points  Count back from 20  0 points  Months in reverse  0 points  Repeat phrase 0 points 0 points  Total Score  0 points    Immunizations Immunization History  Administered Date(s) Administered   Fluad Quad(high Dose 65+) 06/14/2019, 07/03/2021, 07/31/2022   Influenza Split 10/24/2011   Influenza, High Dose Seasonal PF 08/05/2018, 08/08/2020   Influenza-Unspecified 07/13/2016   PFIZER(Purple Top)SARS-COV-2 Vaccination 10/18/2019, 11/08/2019, 08/29/2020   Pneumococcal Conjugate-13 05/02/2016  Pneumococcal Polysaccharide-23 04/27/2012, 06/03/2018   Tdap 04/27/2013   Zoster, Live 04/27/2013    Shingrix Completed?: No.    Education has been provided regarding the importance of this vaccine. Patient has been advised to call insurance company to determine out of pocket expense if they have not yet received this vaccine. Advised may also receive vaccine at local pharmacy or Health Dept. Verbalized  acceptance and understanding.  Screening Tests Health Maintenance  Topic Date Due   COVID-19 Vaccine (4 - 2023-24 season) 01/22/2023 (Originally 06/14/2022)   Zoster Vaccines- Shingrix (1 of 2) 04/08/2023 (Originally 04/13/2001)   COLONOSCOPY (Pts 45-11yrs Insurance coverage will need to be confirmed)  03/03/2023   MAMMOGRAM  03/07/2023   DTaP/Tdap/Td (2 - Td or Tdap) 04/28/2023   Medicare Annual Wellness (AWV)  01/06/2024   Pneumonia Vaccine 10+ Years old  Completed   INFLUENZA VACCINE  Completed   DEXA SCAN  Completed   Hepatitis C Screening  Completed   HPV VACCINES  Aged Out   Health Maintenance There are no preventive care reminders to display for this patient.  Mammogram- ordered per consent. Number provided for scheduling. [phone].  Lung Cancer Screening: (Low Dose CT Chest recommended if Age 85-80 years, 30 pack-year currently smoking OR have quit w/in 15years.) does not qualify.   Hepatitis C Screening: Completed 05/01/15.   Vision Screening: Recommended annual ophthalmology exams for early detection of glaucoma and other disorders of the eye.  Dental Screening: Recommended annual dental exams for proper oral hygiene.  Community Resource Referral / Chronic Care Management: CRR required this visit?  No   CCM required this visit?  No      Plan:     I have personally reviewed and noted the following in the patient's chart:   Medical and social history Use of alcohol, tobacco or illicit drugs  Current medications and supplements including opioid prescriptions. Patient is not currently taking opioid prescriptions. Functional ability and status Nutritional status Physical activity Advanced directives List of other physicians Hospitalizations, surgeries, and ER visits in previous 12 months Vitals Screenings to include cognitive, depression, and falls Referrals and appointments  In addition, I have reviewed and discussed with patient certain preventive  protocols, quality metrics, and best practice recommendations. A written personalized care plan for preventive services as well as general preventive health recommendations were provided to patient.     [place], LPN   [id number]    I have reviewed the above information and agree with above.   Deborra Medina, MD

## 2023-01-28 DIAGNOSIS — H26493 Other secondary cataract, bilateral: Secondary | ICD-10-CM | POA: Diagnosis not present

## 2023-01-29 ENCOUNTER — Other Ambulatory Visit: Payer: Self-pay | Admitting: Internal Medicine

## 2023-01-29 DIAGNOSIS — Z1231 Encounter for screening mammogram for malignant neoplasm of breast: Secondary | ICD-10-CM

## 2023-01-30 DIAGNOSIS — K080 Exfoliation of teeth due to systemic causes: Secondary | ICD-10-CM | POA: Diagnosis not present

## 2023-02-13 DIAGNOSIS — L814 Other melanin hyperpigmentation: Secondary | ICD-10-CM | POA: Diagnosis not present

## 2023-03-11 DIAGNOSIS — K080 Exfoliation of teeth due to systemic causes: Secondary | ICD-10-CM | POA: Diagnosis not present

## 2023-03-17 ENCOUNTER — Ambulatory Visit
Admission: RE | Admit: 2023-03-17 | Discharge: 2023-03-17 | Disposition: A | Discharge status: Home / Self Care | Payer: Medicare Other | Source: Ambulatory Visit | Attending: Internal Medicine | Admitting: Internal Medicine

## 2023-03-17 DIAGNOSIS — Z1231 Encounter for screening mammogram for malignant neoplasm of breast: Secondary | ICD-10-CM | POA: Diagnosis not present

## 2023-05-20 ENCOUNTER — Encounter: Payer: Self-pay | Admitting: Internal Medicine

## 2023-05-22 NOTE — Telephone Encounter (Signed)
LMTCB

## 2023-05-22 NOTE — Telephone Encounter (Signed)
Pt called back and stated that her heart rate elevates to 178-185 when she is playing pickle ball and she doesn't having to be playing very hard. She stated that when it happens she goes easy to allow her heart rate to go back down. She stated that she has no other symptoms like chest pain, or shortness of breath when her heart rate elevates. I advised pt that she would need to schedule an appointment to discuss. Pt is out of town and scheduled with Dr. Darrick Huntsman on 06/12/2023. Pt was advised if this worsens or occurs and she develops any other symptoms she should be evaluated immediately. Pt gave a verbal understanding.

## 2023-06-12 ENCOUNTER — Ambulatory Visit (INDEPENDENT_AMBULATORY_CARE_PROVIDER_SITE_OTHER): Payer: Medicare Other

## 2023-06-12 ENCOUNTER — Encounter: Payer: Self-pay | Admitting: Internal Medicine

## 2023-06-12 ENCOUNTER — Ambulatory Visit: Payer: Medicare Other | Admitting: Internal Medicine

## 2023-06-12 ENCOUNTER — Ambulatory Visit: Payer: Medicare Other | Attending: Internal Medicine

## 2023-06-12 VITALS — BP 138/84 | HR 90 | Temp 97.9°F | Ht 64.5 in | Wt 120.2 lb

## 2023-06-12 DIAGNOSIS — R000 Tachycardia, unspecified: Secondary | ICD-10-CM

## 2023-06-12 DIAGNOSIS — K582 Mixed irritable bowel syndrome: Secondary | ICD-10-CM

## 2023-06-12 DIAGNOSIS — R102 Pelvic and perineal pain: Secondary | ICD-10-CM | POA: Diagnosis not present

## 2023-06-12 DIAGNOSIS — R61 Generalized hyperhidrosis: Secondary | ICD-10-CM | POA: Diagnosis not present

## 2023-06-12 DIAGNOSIS — Z1211 Encounter for screening for malignant neoplasm of colon: Secondary | ICD-10-CM

## 2023-06-12 DIAGNOSIS — R1024 Suprapubic pain: Secondary | ICD-10-CM

## 2023-06-12 DIAGNOSIS — R030 Elevated blood-pressure reading, without diagnosis of hypertension: Secondary | ICD-10-CM | POA: Diagnosis not present

## 2023-06-12 DIAGNOSIS — E785 Hyperlipidemia, unspecified: Secondary | ICD-10-CM

## 2023-06-12 LAB — MICROALBUMIN / CREATININE URINE RATIO
Creatinine,U: 57.7 mg/dL
Microalb Creat Ratio: 1.2 mg/g (ref 0.0–30.0)
Microalb, Ur: <0.7 mg/dL (ref 0.0–1.9)

## 2023-06-12 LAB — CBC WITH DIFFERENTIAL/PLATELET
Basophils Absolute: 0 10*3/uL (ref 0.0–0.1)
Basophils Relative: 0.2 % (ref 0.0–3.0)
Eosinophils Absolute: 0 10*3/uL (ref 0.0–0.7)
Eosinophils Relative: 0.6 % (ref 0.0–5.0)
HCT: 41.6 % (ref 36.0–46.0)
Hemoglobin: 13.2 g/dL (ref 12.0–15.0)
Lymphocytes Relative: 31.3 % (ref 12.0–46.0)
Lymphs Abs: 2.3 10*3/uL (ref 0.7–4.0)
MCHC: 31.8 g/dL (ref 30.0–36.0)
MCV: 97.3 fl (ref 78.0–100.0)
Monocytes Absolute: 0.9 10*3/uL (ref 0.1–1.0)
Monocytes Relative: 12.2 % — ABNORMAL HIGH (ref 3.0–12.0)
Neutro Abs: 4 10*3/uL (ref 1.4–7.7)
Neutrophils Relative %: 55.7 % (ref 43.0–77.0)
Platelets: 274 10*3/uL (ref 150.0–400.0)
RBC: 4.27 Mil/uL (ref 3.87–5.11)
RDW: 13.8 % (ref 11.5–15.5)
WBC: 7.2 10*3/uL (ref 4.0–10.5)

## 2023-06-12 LAB — URINALYSIS, ROUTINE W REFLEX MICROSCOPIC
Bilirubin Urine: NEGATIVE
Ketones, ur: NEGATIVE
Leukocytes,Ua: NEGATIVE
Nitrite: NEGATIVE
Specific Gravity, Urine: 1.01 (ref 1.000–1.030)
Total Protein, Urine: NEGATIVE
Urine Glucose: NEGATIVE
Urobilinogen, UA: 0.2 (ref 0.0–1.0)
pH: 6 (ref 5.0–8.0)

## 2023-06-12 LAB — BASIC METABOLIC PANEL
BUN: 15 mg/dL (ref 6–23)
CO2: 26 meq/L (ref 19–32)
Calcium: 9.7 mg/dL (ref 8.4–10.5)
Chloride: 103 mEq/L (ref 96–112)
Creatinine, Ser: 0.91 mg/dL (ref 0.40–1.20)
GFR: 63.18 mL/min (ref >60.00)
Glucose, Bld: 83 mg/dL (ref 70–99)
Potassium: 4 meq/L (ref 3.5–5.1)
Sodium: 137 meq/L (ref 135–145)

## 2023-06-12 LAB — MAGNESIUM: Magnesium: 1.9 mg/dL (ref 1.5–2.5)

## 2023-06-12 MED ORDER — ROSUVASTATIN CALCIUM 10 MG PO TABS: 10.0000 mg | ORAL_TABLET | Freq: Every day | ORAL | 1 refills | Status: DC

## 2023-06-12 NOTE — Progress Notes (Signed)
Subjective:  Patient ID: Taylor [name], female    DOB: 03/21/1951  Age: 72 y.o. MRN: [id number]  CC: The primary encounter diagnosis was Colon cancer screening. Diagnoses of Tachycardia, Hyperlipidemia LDL goal <160, Night sweats, Elevated blood pressure reading in office without diagnosis of hypertension, Suprapubic pressure, Irritable bowel syndrome with both constipation and diarrhea, and Tachycardia with greater than 160 beats per minute were also pertinent to this visit.   HPI Taylor [name] presents for  Chief Complaint  Patient presents with   Tachycardia   Taylor [name] is a 72 yr old  healthy female who presents with a chief concern of  new onset  tachycardia occurring with/ minimal exertion .  She became aware of her pulse because her Apple watch alerted her.   She reports that she had an episode in March of diaphoresis accompanied by subjective feeling foggy and light headed. The episode  resolved after 5 minutes.  However, she woke up the following day wit left arm parasthesias, after sleeping on it. Marland Kitchen She was Evaluated in ER for CVA and MI,  CT head , EKG,  exam,   and cardiac enzymes were  normal.    Since then she has had night sweats, but not occurring nightly.  She also feels that she has been getting more tired and more sweaty with pickle ball games this year compared to during previous years, now has an apple watch , records pulse to a max of 190  usually 160's while. Playing , but she is not aware of the rapid pulse.  She continues to play pickle ball  but has had to reduce playing time to 2 hours,  3 to 4 days per week .  Drinks electrolyte replacement drinks during play but also drinks  decaf tea.   2) Suprapubic pressure  for the last several days.  No dysuria or hematuria.  No flank pain. Occasional  Bilateral low back pain non radiating,  no aggravating factors ,  3)  Feels bloated after eating , stool pattern alternates between diarrhea and constipation .  Has noticed occasional  BRB,, in scant amounts  during diarrhea episodes . Last colonoscopy 2014: internal hemorrhoids noted .      Outpatient Medications Prior to Visit  Medication Sig Dispense Refill   cholestyramine (QUESTRAN) 4 GM/DOSE powder USE 1 SCOOPFUL 3 TIMES DAILY WITH MEALS AS DIRECTED 378 g 5   timolol (TIMOPTIC) 0.5 % ophthalmic solution      vitamin B-12 (CYANOCOBALAMIN) 1000 MCG tablet Take 1,000 mcg by mouth daily.     rosuvastatin (CRESTOR) 10 MG tablet TAKE ONE TABLET BY MOUTH AT BEDTIME (Patient taking differently: Take 5 mg by mouth daily.) 90 tablet 1   Calcium-Vitamin D-Vitamin K (VIACTIV PO) Take as directed (Patient not taking: Reported on 06/12/2023)     VITAMIN D, CHOLECALCIFEROL, PO Take one by mouth daily (Patient not taking: Reported on 06/12/2023)     No facility-administered medications prior to visit.    Review of Systems;  Patient denies headache, fevers, malaise, unintentional weight loss, skin rash, eye pain, sinus congestion and sinus pain, sore throat, dysphagia,  hemoptysis , cough, dyspnea, wheezing, chest pain, palpitations, orthopnea, edema, abdominal pain, nausea, melena, diarrhea, constipation, flank pain, dysuria, hematuria, urinary  Frequency, nocturia, numbness, tingling, seizures,  Focal weakness, Loss of consciousness,  Tremor, insomnia, depression, anxiety, and suicidal ideation.      Objective:  BP 138/84   Pulse 90   Temp 97.9 F (36.6 C) (  Oral)   Ht 5' 4.5" (1.638 m)   Wt 120 lb 3.2 oz (54.5 kg)   SpO2 95%   BMI 20.31 kg/m   BP Readings from Last 3 Encounters:  06/12/23 138/84  12/04/22 118/74  07/31/22 130/78    Wt Readings from Last 3 Encounters:  06/12/23 120 lb 3.2 oz (54.5 kg)  01/06/23 123 lb (55.8 kg)  12/04/22 123 lb 0.3 oz (55.8 kg)    Physical Exam Vitals reviewed.  Constitutional:      General: She is not in acute distress.    Appearance: Normal appearance. She is normal weight. She is not ill-appearing, toxic-appearing or  diaphoretic.  HENT:     Head: Normocephalic.  Eyes:     General: No scleral icterus.       Right eye: No discharge.        Left eye: No discharge.     Conjunctiva/sclera: Conjunctivae normal.  Cardiovascular:     Rate and Rhythm: Normal rate and regular rhythm.     Heart sounds: Normal heart sounds.  Pulmonary:     Effort: Pulmonary effort is normal. No respiratory distress.     Breath sounds: Normal breath sounds.  Abdominal:     General: Abdomen is flat. Bowel sounds are normal.     Palpations: Abdomen is soft. There is no mass.     Tenderness: There is no abdominal tenderness. There is no guarding.     Hernia: No hernia is present.  Musculoskeletal:        General: Normal range of motion.  Skin:    General: Skin is warm and dry.  Neurological:     General: No focal deficit present.     Mental Status: She is alert and oriented to person, place, and time. Mental status is at baseline.  Psychiatric:        Mood and Affect: Mood normal.        Behavior: Behavior normal.        Thought Content: Thought content normal.        Judgment: Judgment normal.     No results found for: "HGBA1C"  Lab Results  Component Value Date   CREATININE 0.91 06/12/2023   CREATININE 0.89 12/04/2022   CREATININE 0.81 07/31/2022    Lab Results  Component Value Date   WBC 7.2 06/12/2023   HGB 13.2 06/12/2023   HCT 41.6 06/12/2023   PLT 274.0 06/12/2023   GLUCOSE 83 06/12/2023   CHOL 225 (H) 07/31/2022   TRIG 144.0 07/31/2022   HDL 81.40 07/31/2022   LDLDIRECT 122.6 04/23/2013   LDLCALC 115 (H) 07/31/2022   ALT 13 07/31/2022   AST 22 07/31/2022   NA 137 06/12/2023   K 4.0 06/12/2023   CL 103 06/12/2023   CREATININE 0.91 06/12/2023   BUN 15 06/12/2023   CO2 26 06/12/2023   TSH 2.51 06/12/2023   MICROALBUR <0.7 06/12/2023    MM 3D SCREENING MAMMOGRAM BILATERAL BREAST  Result Date: 03/18/2023 CLINICAL DATA:  Screening. EXAM: DIGITAL SCREENING BILATERAL MAMMOGRAM WITH  TOMOSYNTHESIS AND CAD TECHNIQUE: Bilateral screening digital craniocaudal and mediolateral oblique mammograms were obtained. Bilateral screening digital breast tomosynthesis was performed. The images were evaluated with computer-aided detection. COMPARISON:  Previous exam(s). ACR Breast Density Category c: The breasts are heterogeneously dense, which may obscure small masses. FINDINGS: There are no findings suspicious for malignancy. IMPRESSION: No mammographic evidence of malignancy. A result letter of this screening mammogram will be mailed directly to the patient. RECOMMENDATION: Screening  mammogram in one year. (Code:SM-B-01Y) BI-RADS CATEGORY  1: Negative. Electronically Signed   By: Amie [place] M.D.   On: 03/18/2023 14:01    Assessment & Plan:  .Colon cancer screening -     Ambulatory referral to Gastroenterology  Tachycardia -     EKG 12-Lead -     LONG TERM MONITOR (3-14 DAYS); Future -     CBC with Differential/Platelet -     Magnesium -     TSH -     Basic metabolic panel  Hyperlipidemia LDL goal <160 -     Rosuvastatin Calcium; Take 1 tablet (10 mg total) by mouth at bedtime.  Dispense: 90 tablet; Refill: 1  Night sweats -     DG Chest 2 View; Future  Elevated blood pressure reading in office without diagnosis of hypertension -     Microalbumin / creatinine urine ratio  Suprapubic pressure -     Urinalysis, Routine w reflex microscopic -     Urine Culture  Irritable bowel syndrome with both constipation and diarrhea Assessment & Plan: Her symptoms of  abdo,inal bloating and irregularity are chronic and have been occurring since her cholecystectomy   She had a CT abd/pelvis  in 2018 for similar symptoms and pelvic organs were unremarkable. She is due for screening colonoscopy; referral in progress.    Tachycardia with greater than 160 beats per minute Assessment & Plan: I have ordered and reviewed a 12 lead EKG and find that there are no acute changes and patient is in  sinus rhythm.   Her EKG today is unchanged from prior tracings from Feb 2024 and 2017 .  She has evidence of left atrial enlargement Episodes occur with exertion,  and may be due to PAF vs deconditionoing vs cardiomyopathy.  ZIO monitor ordered.  Will initiate cardiology follow up for ECHO as well to evaluate EF and wall motion  Orders: -     Ambulatory referral to Cardiology     I spent 30 minutes on the day of this face-to-face  encounter reviewing patient's last visit with me, patient's  most recent ER visit , prior  surgical and non surgical procedures, previous EKGs, labs and imaging studies, counseling on currently addressed issues,  and post visit ordering to diagnostics and therapeutics .     Follow-up: Return in about 4 weeks (around 07/10/2023).   [name], MD

## 2023-06-12 NOTE — Patient Instructions (Addendum)
  I am ordering a ZIO monitor for you to wear for 2 weeks to monitor your heart rhythms .  It will come to your house and you can apply it to your chest wall.  If you have any trouble applying it you can schedule a RN visit in our office and we will help you .  Please "trigger" the device if you have any episodes of sweating or light headedness while you are wearing it

## 2023-06-13 DIAGNOSIS — R000 Tachycardia, unspecified: Secondary | ICD-10-CM | POA: Insufficient documentation

## 2023-06-13 LAB — TSH: TSH: 2.51 u[IU]/mL (ref 0.35–5.50)

## 2023-06-13 LAB — URINE CULTURE
MICRO NUMBER:: 15399942
Result:: NO GROWTH
SPECIMEN QUALITY:: ADEQUATE

## 2023-06-13 NOTE — Assessment & Plan Note (Addendum)
Her symptoms of  abdo,inal bloating and irregularity are chronic and have been occurring since her cholecystectomy   She had a CT abd/pelvis  in 2018 for similar symptoms and pelvic organs were unremarkable. She is due for screening colonoscopy; referral in progress.

## 2023-06-13 NOTE — Assessment & Plan Note (Addendum)
I have ordered and reviewed a 12 lead EKG and find that there are no acute changes and patient is in sinus rhythm.   Her EKG today is unchanged from prior tracings from Feb 2024 and 2017 .  She has evidence of left atrial enlargement Episodes occur with exertion,  and may be due to PAF vs deconditionoing vs cardiomyopathy.  ZIO monitor ordered.  Will initiate cardiology follow up for ECHO as well to evaluate EF and wall motion

## 2023-06-17 ENCOUNTER — Other Ambulatory Visit: Payer: Self-pay | Admitting: Internal Medicine

## 2023-06-17 DIAGNOSIS — R000 Tachycardia, unspecified: Secondary | ICD-10-CM

## 2023-06-17 DIAGNOSIS — M545 Low back pain, unspecified: Secondary | ICD-10-CM

## 2023-06-17 DIAGNOSIS — R140 Abdominal distension (gaseous): Secondary | ICD-10-CM

## 2023-06-17 DIAGNOSIS — R350 Frequency of micturition: Secondary | ICD-10-CM

## 2023-06-24 ENCOUNTER — Ambulatory Visit
Admission: RE | Admit: 2023-06-24 | Discharge: 2023-06-24 | Disposition: A | Discharge status: Home / Self Care | Payer: Medicare Other | Source: Ambulatory Visit | Attending: Internal Medicine | Admitting: Internal Medicine

## 2023-06-24 DIAGNOSIS — R140 Abdominal distension (gaseous): Secondary | ICD-10-CM | POA: Diagnosis not present

## 2023-06-24 DIAGNOSIS — Z780 Asymptomatic menopausal state: Secondary | ICD-10-CM | POA: Diagnosis not present

## 2023-06-24 DIAGNOSIS — M545 Low back pain, unspecified: Secondary | ICD-10-CM | POA: Insufficient documentation

## 2023-06-24 DIAGNOSIS — G8929 Other chronic pain: Secondary | ICD-10-CM | POA: Insufficient documentation

## 2023-06-24 DIAGNOSIS — R102 Pelvic and perineal pain: Secondary | ICD-10-CM | POA: Diagnosis not present

## 2023-06-24 DIAGNOSIS — R350 Frequency of micturition: Secondary | ICD-10-CM | POA: Diagnosis not present

## 2023-06-27 ENCOUNTER — Encounter: Payer: Self-pay | Admitting: Internal Medicine

## 2023-06-29 ENCOUNTER — Encounter: Payer: Self-pay | Admitting: Internal Medicine

## 2023-07-03 DIAGNOSIS — L814 Other melanin hyperpigmentation: Secondary | ICD-10-CM | POA: Diagnosis not present

## 2023-07-03 DIAGNOSIS — L570 Actinic keratosis: Secondary | ICD-10-CM | POA: Diagnosis not present

## 2023-07-03 DIAGNOSIS — L821 Other seborrheic keratosis: Secondary | ICD-10-CM | POA: Diagnosis not present

## 2023-07-04 DIAGNOSIS — R000 Tachycardia, unspecified: Secondary | ICD-10-CM | POA: Diagnosis not present

## 2023-07-04 NOTE — Telephone Encounter (Signed)
The patient called to follow up on her ultrasound results . She stated she has not heard back regarding the results. She also stated she is feeling a lot of discomfort in her kidney area.

## 2023-07-07 ENCOUNTER — Other Ambulatory Visit: Payer: Self-pay

## 2023-07-07 ENCOUNTER — Emergency Department
Admission: EM | Admit: 2023-07-07 | Discharge: 2023-07-07 | Disposition: A | Discharge status: Home / Self Care | Payer: Medicare Other | Attending: Emergency Medicine | Admitting: Emergency Medicine

## 2023-07-07 ENCOUNTER — Emergency Department: Payer: Medicare Other

## 2023-07-07 DIAGNOSIS — R350 Frequency of micturition: Secondary | ICD-10-CM | POA: Insufficient documentation

## 2023-07-07 DIAGNOSIS — Z9049 Acquired absence of other specified parts of digestive tract: Secondary | ICD-10-CM | POA: Diagnosis not present

## 2023-07-07 DIAGNOSIS — M545 Low back pain, unspecified: Secondary | ICD-10-CM | POA: Diagnosis not present

## 2023-07-07 DIAGNOSIS — R109 Unspecified abdominal pain: Secondary | ICD-10-CM | POA: Insufficient documentation

## 2023-07-07 DIAGNOSIS — R300 Dysuria: Secondary | ICD-10-CM | POA: Diagnosis not present

## 2023-07-07 LAB — URINALYSIS, ROUTINE W REFLEX MICROSCOPIC
Bacteria, UA: NONE SEEN
Bilirubin Urine: NEGATIVE
Glucose, UA: NEGATIVE mg/dL
Ketones, ur: NEGATIVE mg/dL
Leukocytes,Ua: NEGATIVE
Nitrite: NEGATIVE
Protein, ur: NEGATIVE mg/dL
Specific Gravity, Urine: 1.012 (ref 1.005–1.030)
pH: 5 (ref 5.0–8.0)

## 2023-07-07 LAB — CBC
HCT: 40.3 % (ref 36.0–46.0)
Hemoglobin: 13.2 g/dL (ref 12.0–15.0)
MCH: 31.3 pg (ref 26.0–34.0)
MCHC: 32.8 g/dL (ref 30.0–36.0)
MCV: 95.5 fL (ref 80.0–100.0)
Platelets: 273 10*3/uL (ref 150–400)
RBC: 4.22 MIL/uL (ref 3.87–5.11)
RDW: 13.3 % (ref 11.5–15.5)
WBC: 6.5 10*3/uL (ref 4.0–10.5)
nRBC: 0 % (ref 0.0–0.2)

## 2023-07-07 LAB — HEPATIC FUNCTION PANEL
ALT: 21 U/L (ref 0–44)
AST: 24 U/L (ref 15–41)
Albumin: 4.1 g/dL (ref 3.5–5.0)
Alkaline Phosphatase: 75 U/L (ref 38–126)
Bilirubin, Direct: <0.1 mg/dL (ref 0.0–0.2)
Total Bilirubin: 0.9 mg/dL (ref 0.3–1.2)
Total Protein: 7.5 g/dL (ref 6.5–8.1)

## 2023-07-07 LAB — LIPASE, BLOOD: Lipase: 45 U/L (ref 11–51)

## 2023-07-07 LAB — BASIC METABOLIC PANEL
Anion gap: 9 (ref 5–15)
BUN: 18 mg/dL (ref 8–23)
CO2: 24 mmol/L (ref 22–32)
Calcium: 9.3 mg/dL (ref 8.9–10.3)
Chloride: 105 mmol/L (ref 98–111)
Creatinine, Ser: 0.89 mg/dL (ref 0.44–1.00)
GFR, Estimated: >60 mL/min (ref >60)
Glucose, Bld: 97 mg/dL (ref 70–99)
Potassium: 3.6 mmol/L (ref 3.5–5.1)
Sodium: 138 mmol/L (ref 135–145)

## 2023-07-07 MED ORDER — CYCLOBENZAPRINE HCL 5 MG PO TABS: 5.0000 mg | ORAL_TABLET | Freq: Three times a day (TID) | ORAL | 0 refills | Status: DC | PRN

## 2023-07-07 NOTE — Discharge Instructions (Signed)
You were seen in the emergency room today for evaluation of your back pain and urinary symptoms.  Your urine test remained without evidence of infection.  Please keep your scheduled follow-up with your primary care doctor later this week for further discussion of your ultrasound results.  Your CT here was reassuring.  I suspect your back pain could be from a muscle strain.  You can take Tylenol and ibuprofen and I sent a short course of a muscle relaxer to your pharmacy that you can take as needed.  Do not drive or operate machinery when taking this.  Return to the ER for new or worsening symptoms.

## 2023-07-07 NOTE — ED Triage Notes (Signed)
Pt to ED for right flank pain. Increased urination. Has been to see PCP and had pelvic US with no results yet. Also had UA that showed some hematuria.  Pt states severe pain woke her out of sleep

## 2023-07-07 NOTE — ED Notes (Signed)
See triage note  Presents with right flank pain and some urinary freq  Sates she was seen by PCP  Awaiting u/s results This am pain is increased and noticed blood in her urine  Afebrile on arrival

## 2023-07-07 NOTE — Telephone Encounter (Signed)
[place] has finally resulted

## 2023-07-07 NOTE — ED Provider Notes (Signed)
[facility] Provider Note    Event Date/Time   First MD Initiated Contact with Patient 07/07/23 [phone]     (approximate)   History   Flank Pain   HPI  Taylor [name] is a 72 year old female with history of IBS presenting to the emergency department for evaluation of right lower back pain.  For the last several weeks, patient has had right lower back pain and some bloating with mild dysuria.  She saw her primary care doctor who ordered a urinalysis that was reassuring and a pelvic ultrasound.  Patient unaware of the results of this, but last night had worsening pain in her right lower back that woke her up leading her to present to the ER.  Pain is worse with movement.  No chest pain or shortness of breath.  No focal numbness, tingling, weakness.  No bowel or bladder incontinence or difficulty voiding.  Took some ibuprofen prior to presentation and reports pain has improved.  I did review her outpatient notes from her primary care doctor discussing normal urinalysis and plans to order ultrasound.  I am able to review patient's ultrasound which demonstrates normal appearance of the uterus, though prominent pelvic veins are noted which could be reflective of pelvic congestion syndrome.     Physical Exam   Triage Vital Signs: ED Triage Vitals [07/07/23 0801]  Encounter Vitals Group     BP (!) 151/100     Systolic BP Percentile      Diastolic BP Percentile      Pulse Rate 89     Resp 16     Temp 97.7 F (36.5 C)     Temp src      SpO2 95 %     Weight 119 lb (54 kg)     Height 5' 4.5" (1.638 m)     Head Circumference      Peak Flow      Pain Score 7     Pain Loc      Pain Education      Exclude from Growth Chart     Most recent vital signs: Vitals:   07/07/23 0801 07/07/23 1207  BP: (!) 151/100 (!) 148/89  Pulse: 89 80  Resp: 16 16  Temp: 97.7 F (36.5 C) 98 F (36.7 C)  SpO2: 95% 96%     General: Awake, interactive  CV:  Regular rate, good  peripheral perfusion.  Resp:  Lungs clear, unlabored respirations.  Abd:  Soft, nondistended, no significant tenderness to palpation  MSK:   Reproducible tenderness to palpation over the right lumbar paraspinous musculature  Neuro:  Symmetric facial movement, fluid speech, moves all extremity spontaneously and equally with 5 out of 5 strength and normal sensation   ED Results / Procedures / Treatments   Labs (all labs ordered are listed, but only abnormal results are displayed) Labs Reviewed  URINALYSIS, ROUTINE W REFLEX MICROSCOPIC - Abnormal; Notable for the following components:      Result Value   Color, Urine STRAW (*)    APPearance CLEAR (*)    Hgb urine dipstick SMALL (*)    All other components within normal limits  CBC  BASIC METABOLIC PANEL  HEPATIC FUNCTION PANEL  LIPASE, BLOOD     EKG EKG independently reviewed interpreted by myself (ER attending) demonstrates:    RADIOLOGY Imaging independently reviewed and interpreted by myself demonstrates:  CT A/P without evidence of a renal stone or other acute abnormality  PROCEDURES:  Critical  Care performed: No  Procedures   MEDICATIONS ORDERED IN ED: Medications - No data to display   IMPRESSION / MDM / ASSESSMENT AND PLAN / ED COURSE  I reviewed the triage vital signs and the nursing notes.  Differential diagnosis includes, but is not limited to, renal stone, musculoskeletal strain, UTI, pelvic congestion syndrome, lower suspicion other acute intra-abdominal process given reassuring abdominal exam  Patient's presentation is most consistent with acute presentation with potential threat to life or bodily function.  72 year old female presenting to the emergency department for evaluation of flank pain.  Physical exam seems most consistent with musculoskeletal pain given reproducible tenderness on exam, but consideration for renal stone particularly with urinary symptoms with negative urinalysis.  Here, labs  reassuring, urine remains without evidence of infection.  CT returned without acute findings.  Patient without any complaints on reevaluation.  She has follow-up with her primary care doctor later this week for further evaluation of her ultrasound results.  Do suspect her back pain is likely musculoskeletal, will DC with short course of muscle relaxer.  Do think she is stable for discharge home.  Strict return precautions provided.     FINAL CLINICAL IMPRESSION(S) / ED DIAGNOSES   Final diagnoses:  Acute right-sided low back pain without sciatica  Urinary frequency     Rx / DC Orders   ED Discharge Orders          Ordered    cyclobenzaprine (FLEXERIL) 5 MG tablet  3 times daily PRN        07/07/23 1240             Note:  This document was prepared using Dragon voice recognition software and may include unintentional dictation errors.   Taylor Post, MD 07/07/23 1240

## 2023-07-10 ENCOUNTER — Encounter: Payer: Self-pay | Admitting: Internal Medicine

## 2023-07-10 ENCOUNTER — Telehealth: Payer: Self-pay | Admitting: Internal Medicine

## 2023-07-10 ENCOUNTER — Ambulatory Visit (INDEPENDENT_AMBULATORY_CARE_PROVIDER_SITE_OTHER): Payer: Medicare Other | Admitting: Internal Medicine

## 2023-07-10 VITALS — BP 114/76 | HR 100 | Ht 64.5 in | Wt 120.8 lb

## 2023-07-10 DIAGNOSIS — K2950 Unspecified chronic gastritis without bleeding: Secondary | ICD-10-CM

## 2023-07-10 DIAGNOSIS — R102 Pelvic and perineal pain: Secondary | ICD-10-CM | POA: Diagnosis not present

## 2023-07-10 DIAGNOSIS — R140 Abdominal distension (gaseous): Secondary | ICD-10-CM | POA: Diagnosis not present

## 2023-07-10 DIAGNOSIS — K582 Mixed irritable bowel syndrome: Secondary | ICD-10-CM | POA: Diagnosis not present

## 2023-07-10 DIAGNOSIS — G8929 Other chronic pain: Secondary | ICD-10-CM

## 2023-07-10 MED ORDER — OMEPRAZOLE 40 MG PO CPDR: 40.0000 mg | DELAYED_RELEASE_CAPSULE | Freq: Every day | ORAL | 2 refills | Status: DC

## 2023-07-10 MED ORDER — CHOLESTYRAMINE 4 GM/DOSE PO POWD: ORAL | 5 refills | Status: DC

## 2023-07-10 NOTE — Telephone Encounter (Signed)
Lft pt vm to call ofc . thanks

## 2023-07-10 NOTE — Patient Instructions (Addendum)
You can  increase dose of flexeril to  10 mg if needed for cramping   Eat more slowly and  try not to multi task when eating . This should reduce the mount of AIR you ingest and cut down on the GAS    Omeprazole 40 mg daily on empty stomach  for the gastritis   Try using gas x   after ward  and beano before any meal containing vegetables   Reduce tums  to  4 tablets daily if needed it may be aggravating constipation   Gastric emptying study to assess for gastroparesis    Referral to gyn once I speak with Dr Tiburcio Pea  for the "pelvic congestion syndrome"

## 2023-07-10 NOTE — Assessment & Plan Note (Addendum)
Ultrasound  and CT abd pelvis (done by ER physician ) were both reassuring but suggested pelvic congestion syndrome.  Given the lack of evidence based treatment strategies for this syndrome,  Referral to GYN recommended.

## 2023-07-11 NOTE — Assessment & Plan Note (Signed)
Unclear if current symptoms are due to IBS or gastroparesis.  GI referral in progress.  Gastric emptying study ordered and trial of omeprazole

## 2023-07-25 ENCOUNTER — Encounter
Admission: RE | Admit: 2023-07-25 | Discharge: 2023-07-25 | Disposition: A | Discharge status: Home / Self Care | Payer: Medicare Other | Source: Ambulatory Visit | Attending: Internal Medicine | Admitting: Internal Medicine

## 2023-07-25 DIAGNOSIS — R140 Abdominal distension (gaseous): Secondary | ICD-10-CM | POA: Diagnosis not present

## 2023-07-25 DIAGNOSIS — Z0389 Encounter for observation for other suspected diseases and conditions ruled out: Secondary | ICD-10-CM | POA: Diagnosis not present

## 2023-07-25 MED ORDER — TECHNETIUM TC 99M SULFUR COLLOID
2.0000 | Freq: Once | INTRAVENOUS | Status: AC | PRN
  Administered 2023-07-25: 2.02 via INTRAVENOUS

## 2023-08-04 ENCOUNTER — Ambulatory Visit
Admission: RE | Admit: 2023-08-04 | Discharge: 2023-08-04 | Disposition: A | Discharge status: Home / Self Care | Payer: Medicare Other | Source: Ambulatory Visit | Attending: Internal Medicine | Admitting: Internal Medicine

## 2023-08-04 ENCOUNTER — Ambulatory Visit: Payer: Medicare Other | Admitting: Internal Medicine

## 2023-08-04 ENCOUNTER — Encounter: Payer: Self-pay | Admitting: Internal Medicine

## 2023-08-04 ENCOUNTER — Ambulatory Visit
Admission: RE | Admit: 2023-08-04 | Discharge: 2023-08-04 | Disposition: A | Discharge status: Home / Self Care | Payer: Medicare Other | Attending: Internal Medicine | Admitting: Internal Medicine

## 2023-08-04 VITALS — BP 126/78 | HR 88 | Ht 64.5 in | Wt 122.6 lb

## 2023-08-04 DIAGNOSIS — R000 Tachycardia, unspecified: Secondary | ICD-10-CM

## 2023-08-04 DIAGNOSIS — E785 Hyperlipidemia, unspecified: Secondary | ICD-10-CM | POA: Diagnosis not present

## 2023-08-04 DIAGNOSIS — M25552 Pain in left hip: Secondary | ICD-10-CM

## 2023-08-04 DIAGNOSIS — M545 Low back pain, unspecified: Secondary | ICD-10-CM | POA: Insufficient documentation

## 2023-08-04 DIAGNOSIS — R102 Pelvic and perineal pain: Secondary | ICD-10-CM | POA: Diagnosis not present

## 2023-08-04 DIAGNOSIS — G8929 Other chronic pain: Secondary | ICD-10-CM

## 2023-08-04 DIAGNOSIS — Z1231 Encounter for screening mammogram for malignant neoplasm of breast: Secondary | ICD-10-CM

## 2023-08-04 DIAGNOSIS — M47816 Spondylosis without myelopathy or radiculopathy, lumbar region: Secondary | ICD-10-CM | POA: Diagnosis not present

## 2023-08-04 DIAGNOSIS — Z23 Encounter for immunization: Secondary | ICD-10-CM

## 2023-08-04 DIAGNOSIS — M5137 Other intervertebral disc degeneration, lumbosacral region with discogenic back pain only: Secondary | ICD-10-CM | POA: Diagnosis not present

## 2023-08-04 DIAGNOSIS — M419 Scoliosis, unspecified: Secondary | ICD-10-CM | POA: Diagnosis not present

## 2023-08-04 DIAGNOSIS — Z1211 Encounter for screening for malignant neoplasm of colon: Secondary | ICD-10-CM

## 2023-08-04 DIAGNOSIS — K581 Irritable bowel syndrome with constipation: Secondary | ICD-10-CM | POA: Diagnosis not present

## 2023-08-04 DIAGNOSIS — Z0001 Encounter for general adult medical examination with abnormal findings: Secondary | ICD-10-CM

## 2023-08-04 LAB — COMPREHENSIVE METABOLIC PANEL
ALT: 15 U/L (ref 0–35)
AST: 21 U/L (ref 0–37)
Albumin: 4.3 g/dL (ref 3.5–5.2)
Alkaline Phosphatase: 76 U/L (ref 39–117)
BUN: 16 mg/dL (ref 6–23)
CO2: 27 meq/L (ref 19–32)
Calcium: 9.8 mg/dL (ref 8.4–10.5)
Chloride: 103 meq/L (ref 96–112)
Creatinine, Ser: 0.89 mg/dL (ref 0.40–1.20)
GFR: 64.82 mL/min (ref >60.00)
Glucose, Bld: 89 mg/dL (ref 70–99)
Potassium: 4.5 meq/L (ref 3.5–5.1)
Sodium: 139 meq/L (ref 135–145)
Total Bilirubin: 0.8 mg/dL (ref 0.2–1.2)
Total Protein: 7 g/dL (ref 6.0–8.3)

## 2023-08-04 LAB — LIPID PANEL
Cholesterol: 244 mg/dL — ABNORMAL HIGH (ref 0–200)
HDL: 77.1 mg/dL (ref >39.00)
LDL Cholesterol: 134 mg/dL — ABNORMAL HIGH (ref 0–99)
NonHDL: 166.79
Total CHOL/HDL Ratio: 3
Triglycerides: 163 mg/dL — ABNORMAL HIGH (ref 0.0–149.0)
VLDL: 32.6 mg/dL (ref 0.0–40.0)

## 2023-08-04 LAB — LDL CHOLESTEROL, DIRECT: Direct LDL: 134 mg/dL

## 2023-08-04 MED ORDER — OMEPRAZOLE 40 MG PO CPDR: 40.0000 mg | DELAYED_RELEASE_CAPSULE | Freq: Every day | ORAL | 3 refills | Status: DC

## 2023-08-04 NOTE — Progress Notes (Signed)
Patient ID: Taylor [name], female    DOB: 1951-06-29  Age: 72 y.o. MRN: [id number]  The patient is here for annual preventive examination and management of other chronic and acute problems.   The risk factors are reflected in the social history.   The roster of all physicians providing medical care to patient - is listed in the Snapshot section of the chart.   Activities of daily living:  The patient is 100% independent in all ADLs: dressing, toileting, feeding as well as independent mobility   Home safety : The patient has smoke detectors in the home. They wear seatbelts.  There are no unsecured firearms at home. There is no violence in the home.    There is no risks for hepatitis, STDs or HIV. There is no   history of blood transfusion. They have no travel history to infectious disease endemic areas of the world.   The patient has seen their dentist in the last six month. They have seen their eye doctor in the last year. The patinet  denies slight hearing difficulty with regard to whispered voices and some television programs.  They have deferred audiologic testing in the last year.  They do not  have excessive sun exposure. Discussed the need for sun protection: hats, long sleeves and use of sunscreen if there is significant sun exposure.    Diet: the importance of a healthy diet is discussed. They do have a healthy diet.   The benefits of regular aerobic exercise were discussed. The patient  exercises  3 to 5 days per week  for  60 minutes.    Depression screen: there are no signs or vegative symptoms of depression- irritability, change in appetite, anhedonia, sadness/tearfullness.   The following portions of the patient's history were reviewed and updated as appropriate: allergies, current medications, past family history, past medical history,  past surgical history, past social history  and problem list.   Visual acuity was not assessed per patient preference since the patient has regular  follow up with an  ophthalmologist. Hearing and body mass index were assessed and reviewed.   lvic During the course of the visit the patient was educated and counseled about appropriate screening and preventive services including : fall prevention , diabetes screening, nutrition counseling, colorectal cancer screening, and recommended immunizations.    Chief Complaint:  Pelvic discomfort back pain .  urinating ,   bloating after every mea; l workup reviewed.  Post chole diarrhea.  Improved with meds other than tums. Does not move bowels more than  every 2 days,  mostly solid since reducing questran to an every other day  .  Sees [facility] in Nov.   Has not received an appt yet with dr cousins to review  and manage pelvic  congestion syndrome   Left lumbar spine pain radiates to left hip and buttock.  The pain is intermittent  and was not brought on by  prolonged car ride , but occurs in the middle of the night adnd is aggravated by  physical activit y    HPI     Medical Management of Chronic Issues    Additional comments: Yearly follow up       Last edited by Sandy Salaam, CMA on 08/04/2023  9:40 AM.        Review of Symptoms  Patient denies headache, fevers, malaise, unintentional weight loss, skin rash, eye pain, sinus congestion and sinus pain, sore throat, dysphagia,  hemoptysis , cough, dyspnea, wheezing,  chest pain, palpitations, orthopnea, edema, abdominal pain, nausea, melena, diarrhea, constipation, flank pain, dysuria, hematuria, urinary  Frequency, nocturia, numbness, tingling, seizures,  Focal weakness, Loss of consciousness,  Tremor, insomnia, depression, anxiety, and suicidal ideation.    Physical Exam:  BP 126/78   Pulse 88   Ht 5' 4.5" (1.638 m)   Wt 122 lb 9.6 oz (55.6 kg)   SpO2 99%   BMI 20.72 kg/m    Physical Exam Vitals reviewed.  Constitutional:      General: She is not in acute distress.    Appearance: Normal appearance. She is well-developed and  normal weight. She is not ill-appearing, toxic-appearing or diaphoretic.  HENT:     Head: Normocephalic.     Right Ear: Tympanic membrane, ear canal and external ear normal. There is no impacted cerumen.     Left Ear: Tympanic membrane, ear canal and external ear normal. There is no impacted cerumen.     Nose: Nose normal.     Mouth/Throat:     Mouth: Mucous membranes are moist.     Pharynx: Oropharynx is clear.  Eyes:     General: No scleral icterus.       Right eye: No discharge.        Left eye: No discharge.     Conjunctiva/sclera: Conjunctivae normal.     Pupils: Pupils are equal, round, and reactive to light.  Neck:     Thyroid: No thyromegaly.     Vascular: No carotid bruit or JVD.  Cardiovascular:     Rate and Rhythm: Normal rate and regular rhythm.     Heart sounds: Normal heart sounds.  Pulmonary:     Effort: Pulmonary effort is normal. No respiratory distress.     Breath sounds: Normal breath sounds.  Chest:  Breasts:    Breasts are symmetrical.     Right: Normal. No swelling, inverted nipple, mass, nipple discharge, skin change or tenderness.     Left: Normal. No swelling, inverted nipple, mass, nipple discharge, skin change or tenderness.  Abdominal:     General: Bowel sounds are normal.     Palpations: Abdomen is soft. There is no mass.     Tenderness: There is no abdominal tenderness. There is no guarding or rebound.  Musculoskeletal:        General: Normal range of motion.     Cervical back: Normal range of motion and neck supple.  Lymphadenopathy:     Cervical: No cervical adenopathy.     Upper Body:     Right upper body: No supraclavicular, axillary or pectoral adenopathy.     Left upper body: No supraclavicular, axillary or pectoral adenopathy.  Skin:    General: Skin is warm and dry.  Neurological:     General: No focal deficit present.     Mental Status: She is alert and oriented to person, place, and time. Mental status is at baseline.  Psychiatric:         Mood and Affect: Mood normal.        Behavior: Behavior normal.        Thought Content: Thought content normal.        Judgment: Judgment normal.    Assessment and Plan: Colon cancer screening  Encounter for screening mammogram for malignant neoplasm of breast -     3D Screening Mammogram, Left and Right; Future  Hyperlipidemia LDL goal <160 -     Lipid panel -     LDL cholesterol, direct -  Comprehensive metabolic panel  Irritable bowel syndrome with constipation Assessment & Plan: GASTROPARESIS RULED OUT BY gastric emptying study Jul 25 2023      Pelvic pain -     CA 125  Chronic left-sided low back pain without sciatica Assessment & Plan: Unclear if patient has DDD or DJD hip or both.  Plain films ordered to rule out vertebral fracture and DDD  Orders: -     DG Lumbar Spine 2-3 Views; Future  Chronic left hip pain Assessment & Plan: History and exam suggestive of DJD . Plain films ordered   Orders: -     DG HIP UNILAT W OR W/O PELVIS 1V LEFT; Future  Need for influenza vaccination -     Flu Vaccine Trivalent High Dose (Fluad)  Tachycardia with greater than 160 beats per minute Assessment & Plan: No evidence of atrial fibrillation by  ZIO monitor.     She has evidence of left atrial enlargement Episodes occur with exertion,   Will initiate cardiology follow up for ECHO as well to evaluate EF and wall motion   Encounter for general adult medical examination with abnormal findings Assessment & Plan: age appropriate education and counseling updated, referrals for preventative services and immunizations addressed, dietary and smoking counseling addressed, most recent labs reviewed.  I have personally reviewed and have noted:   1) the patient's medical and social history 2) The pt's use of alcohol, tobacco, and illicit drugs 3) The patient's current medications and supplements 4) Functional ability including ADL's, fall risk, home safety risk, hearing  and visual impairment 5) Diet and physical activities 6) Evidence for depression or mood disorder 7) The patient's height, weight, and BMI have been recorded in the chart    I have made referrals, and provided counseling and education based on review of the above    Other orders -     Omeprazole; Take 1 capsule (40 mg total) by mouth daily.  Dispense: 90 capsule; Refill: 3    No follow-ups on file.  [name], MD

## 2023-08-04 NOTE — Assessment & Plan Note (Signed)
Unclear if patient has DDD or DJD hip or both.  Plain films ordered to rule out vertebral fracture and DDD

## 2023-08-04 NOTE — Assessment & Plan Note (Signed)
GASTROPARESIS RULED OUT BY gastric emptying study Jul 25 2023

## 2023-08-04 NOTE — Assessment & Plan Note (Signed)

## 2023-08-04 NOTE — Patient Instructions (Signed)
Plain films of lumbar spine and left hip  have been ordered and can be done anytime at the [facility] on [address]  Avoid daily use of ibuprofen and naproxen. Use tylenol and try 10 mg flexeril    You can take up to 2000 mg of acetominophen (tylenol) every day safely  In divided doses (500 mg every 6 hours  Or 1000 mg every 12 hours.)   Call Dr Cousin's office to arrange the GYN appt

## 2023-08-04 NOTE — Assessment & Plan Note (Signed)
No evidence of atrial fibrillation by  ZIO monitor.     She has evidence of left atrial enlargement Episodes occur with exertion,   Will initiate cardiology follow up for ECHO as well to evaluate EF and wall motion

## 2023-08-04 NOTE — Assessment & Plan Note (Signed)
History and exam suggestive of DJD . Plain films ordered

## 2023-08-05 LAB — CA 125: CA 125: 13 U/mL (ref <35)

## 2023-08-10 ENCOUNTER — Other Ambulatory Visit: Payer: Self-pay | Admitting: Cardiology

## 2023-08-10 NOTE — Progress Notes (Unsigned)
Cardiology Office Note:  .   Date:  08/10/2023  ID:  Taylor [name], DOB 04/25/1951, MRN [id number] PCP: [name], MD  [facility] Providers Cardiologist:  None { Click to update primary MD,subspecialty MD or APP then REFRESH:1}    No chief complaint on file.   Patient Profile: .     Taylor [name] is a  72 y.o. female with a PMH notable for IBS, hyperlipidemia, borderline hypertension who presents here for EVALUATION OF TACHYCARDIA at the request of [name], MD.  Taylor [name] went to see her PCP for routine follow-up on 06/12/2023 for evaluation of tachycardia.  She was noting heart rates greater than 160 bpm.  Noted exertional tachycardia with minimal exertion.  Episode in March 2024 where she felt diaphoretic with foggy lightheadedness.  Last about 5 minutes.  The following day he had some left-sided paresthesias, likely related to sleeping on the arm.  Negative MRI evaluation.  Noting slightly more easily fatigued.  Notes that on her Apple watch she will see her pulse rate going up to 190s, and can be as high as 160s while playing pickle ball.  Denied any chest pain.  She does use electrolyte replacement drinks. => Zio patch ordered, reviewed below   [facility] ER visit 07/07/2023 for right lower back/flank pain evaluation.  Felt to be musculoskeletal after evaluation.  Discharged home.  Subjective  Discussed the use of AI scribe software for clinical note transcription with the patient, who gave verbal consent to proceed.  History of Present Illness           Cardiovascular ROS: {roscv:310661} ROS:   Review of Systems - {ros master:310782}     Objective   Studies Reviewed: Marland Kitchen        Coronary Calcium Score 05/14/2016: CAC score 0.  Ascending arctic root measured 3.9 cm. 14-day Zio patch monitor (September 2024):  Predominant rhythm is sinus with a rate of 49 to 171 bpm, average of 77 bpm.   Rare PACs and PVCs with some couplets and triplets.   14 episodes of  atrial tachycardia:  Fastest was 4 beats (1.6 seconds) with a rate range of 169-214 beats and an average of 189 bpm;  longest was 19 beats (9.2 seconds) with a range of 109 to 136 bpm, and an average of 126 bpm.   Cannot exclude some aberrant conduction.   No patient documented events are noted. No Sustained Arrhythmias: Atrial Tachycardia (AT), Supraventricular Tachycardia (SVT), Atrial Fibrillation (A-Fib), Atrial Flutter (A-Flutter), Sustained Ventricular Tachycardia (VT); no significant bradycardia, sinus pauses, AV block noted.   Risk Assessment/Calculations:     No BP recorded.  {Refresh Note OR Click here to enter BP  :1}***        Physical Exam:   VS:  There were no vitals taken for this visit.   Wt Readings from Last 3 Encounters:  08/04/23 122 lb 9.6 oz (55.6 kg)  07/10/23 120 lb 12.8 oz (54.8 kg)  07/07/23 119 lb (54 kg)    GEN: Well nourished, well developed in no acute distress; *** NECK: No JVD; No carotid bruits CARDIAC: Normal S1, S2; RRR, no murmurs, rubs, gallops RESPIRATORY:  Clear to auscultation without rales, wheezing or rhonchi ; nonlabored, good air movement. ABDOMEN: Soft, non-tender, non-distended EXTREMITIES:  No edema; No deformity      ASSESSMENT AND PLAN: .    Problem List Items Addressed This Visit   None   Assessment and Plan               {  Are you ordering a CV Procedure (e.g. stress test, cath, DCCV, TEE, etc)?   Press F2        :[id number]}   Dispo: No follow-ups on file.  Total time spent: *** min spent with patient + *** min spent charting = *** min      Signed, Marykay Lex, MD, MS Bryan Lemma, M.D., M.S. Interventional Cardiologist  [facility]  Pager # [phone] Phone # [phone] [address] [place], [place] [address]

## 2023-08-11 ENCOUNTER — Telehealth: Payer: Self-pay

## 2023-08-11 NOTE — Telephone Encounter (Signed)
Transition Care Management Follow-up Telephone Call Date of discharge and from where: 07/07/2023 Taylor [name]. Patient declined to participate. She did not feel comfortable giving HIPAA required identifiers.   Deklan Minar [facility]  [facility], [facility] Guide Direct Dial: [phone]  Website: [email or web address]

## 2023-08-18 ENCOUNTER — Ambulatory Visit: Payer: Medicare Other | Admitting: Physician Assistant

## 2023-08-18 ENCOUNTER — Encounter: Payer: Self-pay | Admitting: Physician Assistant

## 2023-08-18 VITALS — BP 139/82 | HR 87 | Temp 97.8°F | Ht 64.5 in | Wt 123.6 lb

## 2023-08-18 DIAGNOSIS — K2950 Unspecified chronic gastritis without bleeding: Secondary | ICD-10-CM | POA: Diagnosis not present

## 2023-08-18 DIAGNOSIS — K2930 Chronic superficial gastritis without bleeding: Secondary | ICD-10-CM

## 2023-08-18 DIAGNOSIS — K580 Irritable bowel syndrome with diarrhea: Secondary | ICD-10-CM | POA: Diagnosis not present

## 2023-08-18 DIAGNOSIS — Z1211 Encounter for screening for malignant neoplasm of colon: Secondary | ICD-10-CM

## 2023-08-18 DIAGNOSIS — R140 Abdominal distension (gaseous): Secondary | ICD-10-CM

## 2023-08-18 DIAGNOSIS — R1013 Epigastric pain: Secondary | ICD-10-CM | POA: Diagnosis not present

## 2023-08-18 DIAGNOSIS — K582 Mixed irritable bowel syndrome: Secondary | ICD-10-CM

## 2023-08-18 MED ORDER — PEG 3350-KCL-NA BICARB-NACL 420 G PO SOLR: 4000.0000 mL | Freq: Once | ORAL | 0 refills | Status: DC

## 2023-08-18 MED ORDER — RIFAXIMIN 550 MG PO TABS
550.0000 mg | ORAL_TABLET | Freq: Three times a day (TID) | ORAL | 0 refills | Status: AC
Start: 2023-08-18 — End: 2023-09-01

## 2023-08-18 MED ORDER — NA SULFATE-K SULFATE-MG SULF 17.5-3.13-1.6 GM/177ML PO SOLN: 1.0000 | Freq: Once | ORAL | 0 refills | Status: AC

## 2023-08-18 NOTE — Progress Notes (Signed)
[name], PA-C [address]  [address]  [place], [place] [address]  Main: [phone]  Fax: [phone]   Gastroenterology Consultation  Referring Provider:     [name], MD Primary Care Physician:  [name], MD Primary Gastroenterologist:  [name], PA-C / Dr. Wyline Mood   Reason for Consultation:     IBS-D, gastritis        HPI:   Taylor [name] is a 72 y.o. y/o female referred for consultation & management  by [name], MD.    She was previously patient of Dr. Mechele Collin at [facility] in 2014.  Here to establish GI care.  Current symptoms: History of Lifelong IBS with chronic diarrhea.  He takes cholestyramine half to 1 packet daily which helps control her diarrhea.  Has chronic generalized abdominal bloating.  Takes Beano and Gas-X.  Has noticed increased epigastric pain and acid reflux for a few months.  Has been taking 2 or 3 times daily.  2 weeks ago was started on omeprazole 40 mg once daily by her PCP which has helped.  GERD and epigastric pain have improved.  She denies unintentional weight loss or family history of colon cancer.  Rarely sees bright red blood on the tissue attributed to hemorrhoids.  She stopped taking ibuprofen.  Only takes Tylenol as needed.  She has had flareup of her IBS for the past 6 months.  EGD 02/2013: Small hiatal hernia, mild gastritis, otherwise normal.  Duodenal biopsies negative for celiac.  Gastric biopsies negative for H. pylori.  Colonoscopy 02/2013: Showed small internal hemorrhoids, otherwise normal.  No polyps.  Colon biopsies were negative for microscopic colitis and IBD.  Abdominal pelvic CT without contrast 06/2023, to evaluate right flank pain, showed no acute abnormality.  Pelvic transvaginal ultrasound 06/2023, to evaluate chronic pelvic pain, showed prominent pelvic veins with possible pelvic congestion syndrome.  No other abnormality.  Gastric emptying study 07/25/2023 was normal.  Labs  07/07/23: Normal CBC, BMP, Hepatic Panal, and Lipase.  Past Medical History:  Diagnosis Date   Chest pain    Fever blister    Hyperlipidemia    Irritable bowel syndrome    Mitral valve prolapse    Normal cardiac stress test 2007   Osteoporosis    Pill esophagitis 10/23/2012   Secondary to alendronate. Workup included chest CT for PE given recent travel to beach,  Symptoms  resolved.  Prior EGD from Mechele Collin was already positive for esophgatis.      Past Surgical History:  Procedure Laterality Date   CATARACT EXTRACTION Left 07/2019   CHOLECYSTECTOMY  Approx - 2002    Prior to Admission medications   Medication Sig Start Date End Date Taking? Authorizing Provider  cholestyramine Lanetta Inch) 4 GM/DOSE powder USE 1 SCOOPFUL 3 TIMES DAILY WITH MEALS AS DIRECTED 07/10/23   [name], MD  cyclobenzaprine (FLEXERIL) 5 MG tablet Take 1 tablet (5 mg total) by mouth 3 (three) times daily as needed for muscle spasms. 07/07/23   [name], MD  omeprazole (PRILOSEC) 40 MG capsule Take 1 capsule (40 mg total) by mouth daily. 08/04/23   [name], MD  rosuvastatin (CRESTOR) 10 MG tablet Take 1 tablet (10 mg total) by mouth at bedtime. 06/12/23   [name], MD  timolol (TIMOPTIC) 0.5 % ophthalmic solution  04/02/19   [provider]  vitamin B-12 (CYANOCOBALAMIN) 1000 MCG tablet Take 1,000 mcg by mouth daily.  [provider]    Family History  Problem Relation Age of Onset   Heart disease Father 43       massive AMI, now post CABG   Heart disease Brother 80       CABG   Multiple sclerosis Mother    Aortic aneurysm Paternal Grandfather        died of rupture   Breast cancer Neg Hx      Social History   Tobacco Use   Smoking status: Never   Smokeless tobacco: Never  Vaping Use   Vaping status: Never Used  Substance Use Topics   Alcohol use: Yes    Alcohol/week: 3.0 standard drinks of alcohol    Types: 3 Glasses of wine per week   Drug use: No     Allergies as of 08/18/2023 - Review Complete 08/18/2023  Allergen Reaction Noted   Tramadol Nausea And Vomiting 07/03/2021   Vancomycin  12/15/2017   Amoxicillin-pot clavulanate Nausea Only 11/01/2014   Azithromycin Rash 08/30/2021    Review of Systems:    All systems reviewed and negative except where noted in HPI.   Physical Exam:  BP 139/82   Pulse 87   Temp 97.8 F (36.6 C)   Ht 5' 4.5" (1.638 m)   Wt 123 lb 9.6 oz (56.1 kg)   BMI 20.89 kg/m  No LMP recorded. Patient is postmenopausal.  General:   Alert,  Well-developed, well-nourished, pleasant and cooperative in NAD Lungs:  Respirations even and unlabored.  Clear throughout to auscultation.   No wheezes, crackles, or rhonchi. No acute distress. Heart:  Regular rate and rhythm; no murmurs, clicks, rubs, or gallops. Abdomen:  Normal bowel sounds.  No bruits.  Soft, and non-distended without masses, hepatosplenomegaly or hernias noted.  Mild epigastric tenderness.  Rest of abdomen is not tender..  No guarding or rebound tenderness.    Neurologic:  Alert and oriented x3;  grossly normal neurologically. Psych:  Alert and cooperative. Normal mood and affect.  Imaging Studies: NM Gastric Emptying  Result Date: 07/25/2023 CLINICAL DATA:  Concern for gastroparesis. EXAM: NUCLEAR MEDICINE GASTRIC EMPTYING SCAN TECHNIQUE: After oral ingestion of radiolabeled meal, sequential abdominal images were obtained for 3 hours. Percentage of activity emptying the stomach was calculated at 1 hour, 2 hour, and 3 hours. RADIOPHARMACEUTICALS:  2.0 mCi Tc-4m sulfur colloid in standardized meal COMPARISON:  CT July 07, 2023 FINDINGS: Expected location of the stomach in the left upper quadrant. Ingested meal empties the stomach gradually over the course of the study. 39% emptied at 1 hr ( normal >= 10%) 77% emptied at 2 hr ( normal >= 40%) 95% emptied at 3 hr ( normal >= 70%) IMPRESSION: No scintigraphic evidence of delayed gastric emptying.  Electronically Signed   By: [name] M.D.   On: 07/25/2023 14:02    Assessment and Plan:   CATRICIA SCHEERER is a 72 y.o. y/o female has been referred for:   1.  Irritable bowel syndrome, diarrhea predominant  Rx Xifaxan 550mg  TID x 14 days  Continue Cholestyramine 1/2 to 1 packet daily to help control diarrhea.  2.  Epigastric Pain / Chronic Gastritis - Improved on PPI.  Continue Omeprazole 40mg  1 tablet daily  Avoid NSAIDs, spicy and acidic foods, and sodas.    3.  Bloating / Gas  Low FODMAP diet handout given.  4.  Colon cancer screening  Last colonoscopy 02/2013 was normal.  She is due for 10-year repeat.  Scheduling Colonoscopy I  discussed risks of colonoscopy with patient to include risk of bleeding, colon perforation, and risk of sedation.  Patient expressed understanding and agrees to proceed with colonoscopy.   Follow up as needed based on colonoscopy results and GI symptoms.  [name], PA-C

## 2023-08-20 ENCOUNTER — Telehealth: Payer: Self-pay | Admitting: Internal Medicine

## 2023-08-20 NOTE — Telephone Encounter (Signed)
Patient just called and wanted to know the up date from her Xray results. She said she has not heard anything yet. She said she had her X-Ray about two weeks ago. Can someone call her. Her number is [phone].

## 2023-08-20 NOTE — Telephone Encounter (Signed)
Spoke with pt to let her know that we still have not received the results of her x rays. I explained to pt that there is a shortage of radiologists nationwide and it is taking longer to get results back. Pt gave a verbal understanding.

## 2023-08-27 DIAGNOSIS — M51369 Other intervertebral disc degeneration, lumbar region without mention of lumbar back pain or lower extremity pain: Secondary | ICD-10-CM | POA: Diagnosis not present

## 2023-08-28 ENCOUNTER — Ambulatory Visit: Payer: Medicare Other | Admitting: Cardiology

## 2023-08-29 ENCOUNTER — Encounter: Payer: Self-pay | Admitting: Internal Medicine

## 2023-09-17 DIAGNOSIS — H524 Presbyopia: Secondary | ICD-10-CM | POA: Diagnosis not present

## 2023-09-29 ENCOUNTER — Encounter
Admission: RE | Disposition: A | Discharge status: Home / Self Care | Payer: Self-pay | Source: Home / Self Care | Attending: Gastroenterology

## 2023-09-29 ENCOUNTER — Ambulatory Visit: Payer: Medicare Other | Admitting: Certified Registered"

## 2023-09-29 ENCOUNTER — Ambulatory Visit
Admission: RE | Admit: 2023-09-29 | Discharge: 2023-09-29 | Disposition: A | Discharge status: Home / Self Care | Payer: Medicare Other | Attending: Gastroenterology | Admitting: Gastroenterology

## 2023-09-29 DIAGNOSIS — D126 Benign neoplasm of colon, unspecified: Secondary | ICD-10-CM

## 2023-09-29 DIAGNOSIS — K219 Gastro-esophageal reflux disease without esophagitis: Secondary | ICD-10-CM | POA: Insufficient documentation

## 2023-09-29 DIAGNOSIS — Z1211 Encounter for screening for malignant neoplasm of colon: Secondary | ICD-10-CM

## 2023-09-29 DIAGNOSIS — D128 Benign neoplasm of rectum: Secondary | ICD-10-CM | POA: Diagnosis not present

## 2023-09-29 DIAGNOSIS — Z79899 Other long term (current) drug therapy: Secondary | ICD-10-CM | POA: Insufficient documentation

## 2023-09-29 DIAGNOSIS — D122 Benign neoplasm of ascending colon: Secondary | ICD-10-CM | POA: Insufficient documentation

## 2023-09-29 DIAGNOSIS — K635 Polyp of colon: Secondary | ICD-10-CM | POA: Diagnosis not present

## 2023-09-29 HISTORY — PX: POLYPECTOMY: SHX5525

## 2023-09-29 HISTORY — PX: COLONOSCOPY WITH PROPOFOL: SHX5780

## 2023-09-29 SURGERY — COLONOSCOPY WITH PROPOFOL
Anesthesia: General

## 2023-09-29 MED ORDER — PHENYLEPHRINE 80 MCG/ML (10ML) SYRINGE FOR IV PUSH (FOR BLOOD PRESSURE SUPPORT)
PREFILLED_SYRINGE | INTRAVENOUS | Status: DC | PRN
  Administered 2023-09-29: 80 ug via INTRAVENOUS

## 2023-09-29 MED ORDER — PROPOFOL 10 MG/ML IV BOLUS
INTRAVENOUS | Status: DC | PRN
  Administered 2023-09-29: 170 ug/kg/min via INTRAVENOUS
  Administered 2023-09-29: 100 mg via INTRAVENOUS

## 2023-09-29 MED ORDER — PROPOFOL 10 MG/ML IV BOLUS
INTRAVENOUS | Status: AC
  Filled 2023-09-29: qty 40

## 2023-09-29 MED ORDER — STERILE WATER FOR IRRIGATION IR SOLN
Status: DC | PRN
  Administered 2023-09-29: 100 mL

## 2023-09-29 MED ORDER — LIDOCAINE HCL (CARDIAC) PF 100 MG/5ML IV SOSY
PREFILLED_SYRINGE | INTRAVENOUS | Status: DC | PRN
  Administered 2023-09-29: 100 mg via INTRAVENOUS

## 2023-09-29 MED ORDER — LIDOCAINE HCL (PF) 2 % IJ SOLN
INTRAMUSCULAR | Status: AC
  Filled 2023-09-29: qty 5

## 2023-09-29 MED ORDER — SODIUM CHLORIDE 0.9 % IV SOLN
INTRAVENOUS | Status: DC
  Administered 2023-09-29: 20 mL/h via INTRAVENOUS

## 2023-09-29 NOTE — Anesthesia Preprocedure Evaluation (Signed)
Anesthesia Evaluation  Patient identified by MRN, date of birth, ID band Patient awake    Reviewed: Allergy & Precautions, NPO status , Patient's Chart, lab work & pertinent test results  History of Anesthesia Complications Negative for: history of anesthetic complications  Airway Mallampati: II  TM Distance: >3 FB Neck ROM: Full    Dental no notable dental hx. (+) Teeth Intact   Pulmonary neg pulmonary ROS, neg sleep apnea, neg COPD, Patient abstained from smoking.Not current smoker   Pulmonary exam normal breath sounds clear to auscultation       Cardiovascular Exercise Tolerance: Good METS(-) hypertension(-) CAD and (-) Past MI negative cardio ROS (-) dysrhythmias  Rhythm:Regular Rate:Normal - Systolic murmurs    Neuro/Psych negative neurological ROS  negative psych ROS   GI/Hepatic ,GERD  Medicated and Controlled,,(+)     (-) substance abuse    Endo/Other  neg diabetes    Renal/GU negative Renal ROS     Musculoskeletal   Abdominal   Peds  Hematology   Anesthesia Other Findings Past Medical History: No date: Chest pain No date: Fever blister No date: Hyperlipidemia No date: Irritable bowel syndrome No date: Mitral valve prolapse 2007: Normal cardiac stress test No date: Osteoporosis 10/23/2012: Pill esophagitis     Comment:  Secondary to alendronate. Workup included chest CT for               PE given recent travel to beach,  Symptoms  resolved.                Prior EGD from Mechele Collin was already positive for               esophgatis.    Reproductive/Obstetrics                              Anesthesia Physical Anesthesia Plan  ASA: 2  Anesthesia Plan: General   Post-op Pain Management: Minimal or no pain anticipated   Induction: Intravenous  PONV Risk Score and Plan: 3 and Propofol infusion, TIVA and Ondansetron  Airway Management Planned: Nasal Cannula  Additional  Equipment: None  Intra-op Plan:   Post-operative Plan:   Informed Consent: I have reviewed the patients History and Physical, chart, labs and discussed the procedure including the risks, benefits and alternatives for the proposed anesthesia with the patient or authorized representative who has indicated his/her understanding and acceptance.     Dental advisory given  Plan Discussed with: CRNA and Surgeon  Anesthesia Plan Comments: (Discussed risks of anesthesia with patient, including possibility of difficulty with spontaneous ventilation under anesthesia necessitating airway intervention, PONV, and rare risks such as cardiac or respiratory or neurological events, and allergic reactions. Discussed the role of CRNA in patient's perioperative care. Patient understands.)         Anesthesia Quick Evaluation

## 2023-09-29 NOTE — Op Note (Signed)
[facility] Gastroenterology Patient Name: Taylor [name] Procedure Date: 09/29/2023 8:32 AM MRN: [id number] Account #: [id number] Date of Birth: 22-Sep-1951 Admit Type: Outpatient Age: 72 Room: [facility] ENDO ROOM 1 Gender: Female Note Status: Finalized Instrument Name: Peds Colonoscope [id number] Procedure:             Colonoscopy Indications:           Screening for colorectal malignant neoplasm Providers:             Wyline Mood MD, MD Referring MD:          [name], MD (Referring MD) Medicines:             Monitored Anesthesia Care Complications:         No immediate complications. Procedure:             Pre-Anesthesia Assessment:                        - Prior to the procedure, a History and Physical was                         performed, and patient medications, allergies and                         sensitivities were reviewed. The patient's tolerance                         of previous anesthesia was reviewed.                        - The risks and benefits of the procedure and the                         sedation options and risks were discussed with the                         patient. All questions were answered and informed                         consent was obtained.                        - ASA Grade Assessment: II - A patient with mild                         systemic disease.                        After obtaining informed consent, the colonoscope was                         passed under direct vision. Throughout the procedure,                         the patient's blood pressure, pulse, and oxygen                         saturations were monitored continuously. The                         Colonoscope was  introduced through the anus and                         advanced to the the cecum, identified by the                         appendiceal orifice. The colonoscopy was performed                         with ease. The patient tolerated the procedure well.                          The quality of the bowel preparation was excellent.                         The ileocecal valve, appendiceal orifice, and rectum                         were photographed. Findings:      The perianal and digital rectal examinations were normal.      A 3 mm polyp was found in the ascending colon. The polyp was sessile.       The polyp was removed with a cold biopsy forceps. Resection and       retrieval were complete.      A 5 mm polyp was found in the rectum. The polyp was sessile. The polyp       was removed with a cold snare. Resection and retrieval were complete.      The exam was otherwise without abnormality on direct and retroflexion       views. Impression:            - One 3 mm polyp in the ascending colon, removed with                         a cold biopsy forceps. Resected and retrieved.                        - One 5 mm polyp in the rectum, removed with a cold                         snare. Resected and retrieved.                        - The examination was otherwise normal on direct and                         retroflexion views. Recommendation:        - Discharge patient to home (with escort).                        - Resume previous diet.                        - Continue present medications.                        - Await pathology results.                        -  Repeat colonoscopy is not recommended due to current                         age (25 years or older) for surveillance based on                         pathology results. Procedure Code(s):     --- Professional ---                        [id number], Colonoscopy, flexible; with removal of                         tumor(s), polyp(s), or other lesion(s) by snare                         technique                        45380, 59, Colonoscopy, flexible; with biopsy, single                         or multiple Diagnosis Code(s):     --- Professional ---                        Z12.11, Encounter for  screening for malignant neoplasm                         of colon                        D12.2, Benign neoplasm of ascending colon                        D12.8, Benign neoplasm of rectum CPT copyright 2022 American Medical Association. All rights reserved. The codes documented in this report are preliminary and upon coder review may  be revised to meet current compliance requirements. Wyline Mood, MD Wyline Mood MD, MD 09/29/2023 8:57:05 AM This report has been signed electronically. Number of Addenda: 0 Note Initiated On: 09/29/2023 8:32 AM Scope Withdrawal Time: 0 hours 9 minutes 23 seconds  Total Procedure Duration: 0 hours 13 minutes 56 seconds  Estimated Blood Loss:  Estimated blood loss: none.      [facility]

## 2023-09-29 NOTE — Anesthesia Postprocedure Evaluation (Signed)
Anesthesia Post Note  Patient: Taylor [name]  Procedure(s) Performed: COLONOSCOPY WITH PROPOFOL POLYPECTOMY  Patient location during evaluation: Endoscopy Anesthesia Type: General Level of consciousness: awake and alert Pain management: pain level controlled Vital Signs Assessment: post-procedure vital signs reviewed and stable Respiratory status: spontaneous breathing, nonlabored ventilation, respiratory function stable and patient connected to nasal cannula oxygen Cardiovascular status: blood pressure returned to baseline and stable Postop Assessment: no apparent nausea or vomiting Anesthetic complications: no   No notable events documented.   Last Vitals:  Vitals:   09/29/23 0901 09/29/23 0911  BP: (!) 93/54 (!) 107/91  Pulse: 84 77  Resp: 13 15  Temp: (!) 36 C   SpO2: 98% 100%    Last Pain:  Vitals:   09/29/23 0911  TempSrc:   PainSc: 0-No pain                 Corinda Gubler

## 2023-09-29 NOTE — Transfer of Care (Signed)
Immediate Anesthesia Transfer of Care Note  Patient: Taylor [name]  Procedure(s) Performed: COLONOSCOPY WITH PROPOFOL POLYPECTOMY  Patient Location: Endoscopy Unit  Anesthesia Type:General  Level of Consciousness: awake, alert , and drowsy  Airway & Oxygen Therapy: Patient Spontanous Breathing  Post-op Assessment: Report given to RN and Post -op Vital signs reviewed and stable  Post vital signs: Reviewed and stable  Last Vitals:  Vitals Value Taken Time  BP 99/63 0900  Temp 35.8 0900  Pulse 79 09/29/23 0901  Resp 18 0900  SpO2 98 % 09/29/23 0901  Vitals shown include unfiled device data.  Last Pain:  Vitals:   09/29/23 0758  TempSrc: Temporal  PainSc: 0-No pain         Complications: No notable events documented.

## 2023-09-29 NOTE — H&P (Signed)
Wyline Mood, MD [address], [place], [place], [address] [address], [place], [place], [address] Phone: [phone]  Fax: [phone]  Primary Care Physician:  [name], MD   Pre-Procedure History & Physical: HPI:  Taylor [name] is a 72 y.o. female is here for an colonoscopy.   Past Medical History:  Diagnosis Date   Chest pain    Fever blister    Hyperlipidemia    Irritable bowel syndrome    Mitral valve prolapse    Normal cardiac stress test 2007   Osteoporosis    Pill esophagitis 10/23/2012   Secondary to alendronate. Workup included chest CT for PE given recent travel to beach,  Symptoms  resolved.  Prior EGD from Mechele Collin was already positive for esophgatis.      Past Surgical History:  Procedure Laterality Date   CATARACT EXTRACTION Left 07/2019   CHOLECYSTECTOMY  Approx - 2002    Prior to Admission medications   Medication Sig Start Date End Date Taking? Authorizing Provider  cholestyramine Lanetta Inch) 4 GM/DOSE powder USE 1 SCOOPFUL 3 TIMES DAILY WITH MEALS AS DIRECTED 07/10/23  Yes [name], MD  cyclobenzaprine (FLEXERIL) 5 MG tablet Take 1 tablet (5 mg total) by mouth 3 (three) times daily as needed for muscle spasms. 07/07/23  Yes Ray, Danie Binder, MD  omeprazole (PRILOSEC) 40 MG capsule Take 1 capsule (40 mg total) by mouth daily. 08/04/23  Yes [name], MD  rosuvastatin (CRESTOR) 10 MG tablet Take 1 tablet (10 mg total) by mouth at bedtime. 06/12/23  Yes [name], MD  timolol (TIMOPTIC) 0.5 % ophthalmic solution  04/02/19  Yes [provider]  vitamin B-12 (CYANOCOBALAMIN) 1000 MCG tablet Take 1,000 mcg by mouth daily.   Yes [provider]    Allergies as of 08/18/2023 - Review Complete 08/18/2023  Allergen Reaction Noted   Tramadol Nausea And Vomiting 07/03/2021   Vancomycin  12/15/2017   Amoxicillin-pot clavulanate Nausea Only 11/01/2014   Azithromycin Rash 08/30/2021    Family History   Problem Relation Age of Onset   Heart disease Father 74       massive AMI, now post CABG   Heart disease Brother 31       CABG   Multiple sclerosis Mother    Aortic aneurysm Paternal Grandfather        died of rupture   Breast cancer Neg Hx     Social History   Socioeconomic History   Marital status: Married    Spouse name: Not on file   Number of children: Not on file   Years of education: Not on file   Highest education level: Not on file  Occupational History   Not on file  Tobacco Use   Smoking status: Never   Smokeless tobacco: Never  Vaping Use   Vaping status: Never Used  Substance and Sexual Activity   Alcohol use: Yes    Alcohol/week: 3.0 standard drinks of alcohol    Types: 3 Glasses of wine per week   Drug use: No   Sexual activity: Not on file  Other Topics Concern   Not on file  Social History Narrative   Not on file   Social Drivers of Health   Financial Resource Strain: Low Risk  (01/05/2023)   Overall Financial Resource Strain (CARDIA)    Difficulty of Paying Living Expenses: Not hard at all  Food Insecurity: No Food Insecurity (01/05/2023)   Hunger  Vital Sign    Worried About Programme researcher, broadcasting/film/video in the Last Year: Never true    Ran Out of Food in the Last Year: Never true  Transportation Needs: No Transportation Needs (01/05/2023)   PRAPARE - Administrator, Civil Service (Medical): No    Lack of Transportation (Non-Medical): No  Physical Activity: Sufficiently Active (01/05/2023)   Exercise Vital Sign    Days of Exercise per Week: 5 days    Minutes of Exercise per Session: 120 min  Stress: No Stress Concern Present (01/05/2023)   [employer] of Occupational Health - Occupational Stress Questionnaire    Feeling of Stress : Not at all  Social Connections: Unknown (01/05/2023)   Social Connection and Isolation Panel [NHANES]    Frequency of Communication with Friends and Family: More than three times a week    Frequency of Social  Gatherings with Friends and Family: More than three times a week    Attends Religious Services: Not on file    Active Member of Clubs or Organizations: Yes    Attends Banker Meetings: More than 4 times per year    Marital Status: Married  Catering manager Violence: Not At Risk (01/06/2023)   Humiliation, Afraid, Rape, and Kick questionnaire    Fear of Current or Ex-Partner: No    Emotionally Abused: No    Physically Abused: No    Sexually Abused: No    Review of Systems: See HPI, otherwise negative ROS  Physical Exam: BP (!) 144/93   Pulse (!) 108   Temp 98.2 F (36.8 C) (Temporal)   Resp 20   Ht 5' 4.5" (1.638 m)   Wt 54.3 kg   SpO2 100%   BMI 20.25 kg/m  General:   Alert,  pleasant and cooperative in NAD Head:  Normocephalic and atraumatic. Neck:  Supple; no masses or thyromegaly. Lungs:  Clear throughout to auscultation, normal respiratory effort.    Heart:  +S1, +S2, Regular rate and rhythm, No edema. Abdomen:  Soft, nontender and nondistended. Normal bowel sounds, without guarding, and without rebound.   Neurologic:  Alert and  oriented x4;  grossly normal neurologically.  Impression/Plan: Taylor [name] is here for an colonoscopy to be performed for Screening colonoscopy average risk   Risks, benefits, limitations, and alternatives regarding  colonoscopy have been reviewed with the patient.  Questions have been answered.  All parties agreeable.   Wyline Mood, MD  09/29/2023, 8:03 AM

## 2023-09-30 ENCOUNTER — Encounter: Payer: Self-pay | Admitting: Gastroenterology

## 2023-09-30 LAB — SURGICAL PATHOLOGY

## 2023-10-27 ENCOUNTER — Encounter: Payer: Self-pay | Admitting: Internal Medicine

## 2023-10-28 MED ORDER — CHOLESTYRAMINE 4 GM/DOSE PO POWD: ORAL | 0 refills | Status: DC

## 2023-10-31 DIAGNOSIS — K080 Exfoliation of teeth due to systemic causes: Secondary | ICD-10-CM | POA: Diagnosis not present

## 2023-11-20 ENCOUNTER — Ambulatory Visit: Payer: Medicare Other | Attending: Cardiology | Admitting: Cardiology

## 2023-11-20 ENCOUNTER — Encounter: Payer: Self-pay | Admitting: Cardiology

## 2023-11-20 VITALS — BP 134/85 | HR 68 | Ht 64.5 in | Wt 121.6 lb

## 2023-11-20 DIAGNOSIS — R000 Tachycardia, unspecified: Secondary | ICD-10-CM | POA: Diagnosis not present

## 2023-11-20 DIAGNOSIS — R0609 Other forms of dyspnea: Secondary | ICD-10-CM

## 2023-11-20 DIAGNOSIS — E785 Hyperlipidemia, unspecified: Secondary | ICD-10-CM | POA: Diagnosis not present

## 2023-11-20 NOTE — Patient Instructions (Signed)
 Medication Instructions:  - Your physician recommends that you continue on your current medications as directed. Please refer to the Current Medication list given to you today.  *If you need a refill on your cardiac medications before your next appointment, please call your pharmacy*   Lab Work: - none ordered  If you have labs (blood work) drawn today and your tests are completely normal, you will receive your results only by: MyChart Message (if you have MyChart) OR A paper copy in the mail If you have any lab test that is abnormal or we need to change your treatment, we will call you to review the results.   Testing/Procedures:  1) Cardiopulmonary Stress Test (CPX):    Your physician has recommended that you have a cardiopulmonary stress test (CPX). CPX testing is a non-invasive measurement of heart and lung function. This test combines measurements of you ventilation, respiratory gas exchange in the lungs, electrocardiogram (EKG), blood pressure and physical response before, during, and following an exercise protocol  You are scheduled for a Cardiopulmonary Exercise (CPX) Test @ [facility] on: __ / __ / ___ @ ___:____AM/PM.  Expect to be in the lab for 2 hours.   Please arrivee @ [address] (main entrance A). Free valet parking is available. You will proceed to admitting 30 minutes prior to your scheduled appointment. You may be asked to rescheduled if you arrive 20 minutes or more after your scheduled appointment time.   After checking in through admitting, proceed to [facility] waiting room.   Instructions for day of the test: - refrain from ingesting a heavy meal, alcohol, or caffeine or using tobacco products within 2 hours of the test - please DO NOT FAST for more than 8 hours - you may have other non-alcoholic, caffeine-free beverages, a light snack (crackers, piece of fruit, toast, bagel, etc) up to your appointment time  - avoid significant  exertion or exercise within 24 hours of your test - be prepared to exercise & sweat - your clothing should permit freedom of movement and include running/walking shoes - women should bring loose-fitting, short-sleeve blouse  - this evaluation may be fatiguing and you may wish to have someone accompany you to the assessment and drive you home afterward  - bring a list of medications with you (including dose, frequency) - take all medications as prescribed unless directed otherwise by your physician    General description of test:  A brief lung function test will be performed. This will involve you taking deep breaths and blowing out hard & fast through your mouth. During these, a nose clip will be on your nose and you will be breathing through a breathing device.   For the exercise portion of the test, you will be walking on a motorized treadmill or riding a stationary bike to your maximal effort or until symptoms such as chest pain, shortness of breath, leg pain or dizziness limit your exercise. Like the lung function test, you will be breathing in and out of a breathing device through your mouth with the nose clip. Your heart rate, ECG, blood pressure, oxygen saturation, breathing rate & depth, amount of oxygen you consume and amount of carbon dioxide you produce will be measured and monitored throughout the test.   If you need to cancel or reschedule your test, call [phone].     Follow-Up: At [facility], you and your health needs are our priority.  As part of our continuing  mission to provide you with exceptional heart care, we have created designated Provider Care Teams.  These Care Teams include your primary Cardiologist (physician) and Advanced Practice Providers (APPs -  Physician Assistants and Nurse Practitioners) who all work together to provide you with the care you need, when you need it.  We recommend signing up for the patient portal called MyChart.  Sign up  information is provided on this After Visit Summary.  MyChart is used to connect with patients for Virtual Visits (Telemedicine).  Patients are able to view lab/test results, encounter notes, upcoming appointments, etc.  Non-urgent messages can be sent to your provider as well.   To learn more about what you can do with MyChart, go to [email or web address].    Your next appointment:   3-4  week(s)  Provider:   You may see Alm Clay, MD or one of the following Advanced Practice Providers on your designated Care Team:   Lonni Meager, NP Bernardino Bring, PA-C Cadence Franchester, PA-C Tylene Lunch, NP Barnie Hila, NP    Other Instruction

## 2023-11-20 NOTE — Progress Notes (Signed)
 Cardiology Office Note:  .   Date:  11/22/2023  ID:  Taylor [name], DOB [birth year], MRN [id number] PCP: Marylynn Verneita CROME, MD  [facility] Providers Cardiologist:  None     Chief Complaint  Patient presents with   New Patient (Initial Visit)    Establish care. Pt c/o tachycardia. Medications reviewed verbally.    Patient Profile: .     Taylor [name] is a  73 y.o. female with a PMH notable for  IBS, hyperlipidemia, borderline hypertension who presents here for EVALUATION OF TACHYCARDIAat the request of Marylynn Verneita CROME, MD.    Taylor [name] went to see her PCP for routine follow-up on 06/12/2023 for evaluation of tachycardia.  She was noting heart rates greater than 160 bpm.  Noted exertional tachycardia with minimal exertion.  Episode in March 2024 where she felt diaphoretic with foggy lightheadedness.  Last about 5 minutes.  The following day he had some left-sided paresthesias, likely related to sleeping on the arm.  Negative MRI evaluation.  Noting slightly more easily fatigued.  Notes that on her Apple watch she will see her pulse rate going up to 190s, and can be as high as 160s while playing pickle ball.  Denied any chest pain.  She does use electrolyte replacement drinks. => Zio patch ordered, reviewed below  Subjective  Discussed the use of AI scribe software for clinical note transcription with the patient, who gave verbal consent to proceed.  History of Present Illness   Taylor [name] is a 73 year old female who presents with concerns about elevated heart rate during exercise. She was referred by Dr. Tullo for evaluation of elevated heart rate during exercise.  She experiences elevated heart rates during physical activity, particularly while playing pickleball, with heart rates reaching 185 to 190 beats per minute after a few games. These episodes resolve with rest and are not present at rest, except for a few instances last year when she felt sweaty and dizzy, leading to an  emergency room visit where no abnormalities were found. She experiences shortness of breath and clamminess during these episodes, especially in hot weather.  She has worn a heart monitor, but it did not capture her heart rate during pickleball as she was not playing during the monitoring period. Her heart rate at rest ranges from 49 to 171 beats per minute, with an average of 77.  She was previously told she had a heart murmur and aortic aneurysm, but subsequent evaluations, including a CT angiogram in 2021, showed normal thoracic aorta caliber and debunked the aneurysm diagnosis. Her coronary calcium  score was zero in 2017, indicating low risk. Her family history is significant for cardiovascular issues, including her father having a massive heart attack at 4 and a five-way bypass in his 70s, her brother having a heart attack and bypass at 63, and her maternal grandmother dying of a massive heart attack at 49.  She takes Crestor  for cholesterol management and has been more consistent with it recently after previous lapses. She also takes Questran  for irritable bowel syndrome. No chest pain, chest pressure, dizziness, or palpitations at rest. No swelling in legs, no shortness of breath when lying flat or waking up short of breath.       Objective   Family History - Father had massive heart attack at 68, had quintuple bypass in his 45s - Brother had heart attack and bypass at 39 - Maternal grandmother died of massive heart attack at 87  Medications - Crestor  10 mg daily - Questran  4gm TID - Prilosec 40 mg daily - Felxeril 5 mg TID PRN - Meloxicam PRN - Timolol eye drops  Studies Reviewed: SABRA   EKG Interpretation Date/Time:  Thursday November 20 2023 08:40:14 EST Ventricular Rate:  68 PR Interval:  178 QRS Duration:  76 QT Interval:  410 QTC Calculation: 435 R Axis:   56  Text Interpretation: Normal sinus rhythm Possible Left atrial enlargement When compared with ECG of 04-Dec-2022  11:30, No significant change was found Confirmed by Anner Lenis ([id number]) on 11/20/2023 9:23:02 AM     Zio Patch MONITOR:   HR 49 - 214, average 77 bpm. 14 nonsustained SVT, longest lasting 19 beats with an average rate 126 bpm. Rare supraventricular and ventricular ectopy. No atrial fibrillation. No sustained arrhythmias.   CT Angio Chest - Aorta: (06/2020):  Stable and normal caliber of the thoracic aorta which is stable in caliber since 2013. Previous estimated top-normal ascending aortic caliber of 3.9 cm on the unenhanced coronary calcium  score study of 05/14/2016 was likely not very accurate and overestimated without the presence of contrast. Annual follow-up CT imaging of the thoracic aorta is entirely unnecessary and echocardiography could be considered in follow-up if the patient were to develop any symptoms suggestive of aortic valvular disease.  Lab Results  Component Value Date   CHOL 244 (H) 08/04/2023   HDL 77.10 08/04/2023   LDLCALC 134 (H) 08/04/2023   LDLDIRECT 134.0 08/04/2023   TRIG 163.0 (H) 08/04/2023   CHOLHDL 3 08/04/2023    Risk Assessment/Calculations:        Physical Exam:   VS:  BP 134/85 (BP Location: Left Arm, Patient Position: Sitting, Cuff Size: Normal)   Pulse 68   Ht 5' 4.5 (1.638 m)   Wt 121 lb 9.6 oz (55.2 kg)   SpO2 98%   BMI 20.55 kg/m    Wt Readings from Last 3 Encounters:  11/20/23 121 lb 9.6 oz (55.2 kg)  09/29/23 119 lb 12.8 oz (54.3 kg)  08/18/23 123 lb 9.6 oz (56.1 kg)    GEN: Well nourished, well developed in no acute distress; healthy appearing NECK: No JVD; No carotid bruits CARDIAC: Normal S1, S2; RRR, no murmurs, rubs, gallops RESPIRATORY:  Clear to auscultation without rales, wheezing or rhonchi ; nonlabored, good air movement. ABDOMEN: Soft, non-tender, non-distended EXTREMITIES:  No edema; No deformity     ASSESSMENT AND PLAN: .    Problem List Items Addressed This Visit       Cardiology Problems    Hyperlipidemia with target LDL less than 100 (Chronic)   Family history of cardiac disease.  SABRAPrevious coronary calcium  score of zero, but recent cholesterol levels were slightly elevated. -Continue Crestor  10 mg daily for cholesterol management. -Target cholesterol levels less than 100 given family history.        Other   Exercise-induced tachycardia - Primary (Chronic)   Exercise-induced Tachycardia Noted episodes of high heart rate (up to 185-190 bpm) during physical activity, specifically pickleball. No symptoms at rest. Previous monitor showed heart rate up to 171 bpm. Family history of significant cardiac disease. -Order a cardiopulmonary stress test to evaluate heart and lung function during exertion.      Relevant Orders   EKG 12-Lead (Completed)   Cardiac Stress Test: Informed Consent Details: Physician/Practitioner Attestation; Transcribe to consent form and obtain patient signature   Cardiopulmonary exercise test   Exertional dyspnea   Noting more than usual dyspnea with the tachycardia  spells with exercise. May need to actually stress her to see moderate heart rate does but also evaluate for potential cardiac etiology for dyspnea.  Will evaluate with a CPX (cardiopulmonary stress test)      Relevant Orders   Cardiac Stress Test: Informed Consent Details: Physician/Practitioner Attestation; Transcribe to consent form and obtain patient signature   Cardiopulmonary exercise test   Irritable Bowel Syndrome Managed with Questran . -Continue current management.  Follow-up After completion of the cardiopulmonary stress test.        Informed Consent   Shared Decision Making/Informed Consent The risks [chest pain, shortness of breath, cardiac arrhythmias, dizziness, blood pressure fluctuations, myocardial infarction, stroke/transient ischemic attack, and life-threatening complications (estimated to be 1 in 10,000)], benefits (risk stratification, diagnosing coronary artery  disease, treatment guidance) and alternatives of an exercise tolerance test were discussed in detail with Ms. Janco and she agrees to proceed.      Follow-Up: Return in about 4 weeks (around 12/18/2023) for Follow-up in [place].     Signed, Alm MICAEL Clay, MD, MS Alm Clay, M.D., M.S. Interventional Cardiologist  [facility]  Pager # [phone] Phone # [phone] [address] [place], [place] [address]

## 2023-11-22 ENCOUNTER — Encounter: Payer: Self-pay | Admitting: Cardiology

## 2023-11-22 DIAGNOSIS — R0609 Other forms of dyspnea: Secondary | ICD-10-CM | POA: Insufficient documentation

## 2023-11-22 DIAGNOSIS — E785 Hyperlipidemia, unspecified: Secondary | ICD-10-CM | POA: Insufficient documentation

## 2023-11-22 NOTE — Assessment & Plan Note (Signed)
 Exercise-induced Tachycardia Noted episodes of high heart rate (up to 185-190 bpm) during physical activity, specifically pickleball. No symptoms at rest. Previous monitor showed heart rate up to 171 bpm. Family history of significant cardiac disease. -Order a cardiopulmonary stress test to evaluate heart and lung function during exertion.

## 2023-11-22 NOTE — Assessment & Plan Note (Signed)
 Family history of cardiac disease.  Taylor AasPrevious coronary calcium  score of zero, but recent cholesterol levels were slightly elevated. -Continue Crestor  10 mg daily for cholesterol management. -Target cholesterol levels less than 100 given family history.

## 2023-11-22 NOTE — Assessment & Plan Note (Signed)
 Noting more than usual dyspnea with the tachycardia spells with exercise. May need to actually stress her to see moderate heart rate does but also evaluate for potential cardiac etiology for dyspnea.  Will evaluate with a CPX (cardiopulmonary stress test)

## 2023-12-17 ENCOUNTER — Encounter: Payer: Self-pay | Admitting: Cardiology

## 2023-12-30 ENCOUNTER — Other Ambulatory Visit: Payer: Self-pay | Admitting: Cardiology

## 2023-12-30 DIAGNOSIS — R000 Tachycardia, unspecified: Secondary | ICD-10-CM

## 2023-12-30 DIAGNOSIS — R0609 Other forms of dyspnea: Secondary | ICD-10-CM

## 2023-12-30 NOTE — Telephone Encounter (Signed)
 Actually now, CPX is not the same as GXT.  Different test-much more detailed.   Bryan Lemma, MD

## 2024-01-02 NOTE — Telephone Encounter (Signed)
 If she is agreeable then I think we can switch to an exercise Myoview stress test which will at least look for macrovascular disease it will not help [place] out with the other physiologic issues but we can do the treadmill portion see what her EKG does then do the Myoview just to make sure there is no evidence of ischemia from a macrovascular standpoint.  Bryan Lemma, MD

## 2024-01-08 ENCOUNTER — Ambulatory Visit

## 2024-01-27 ENCOUNTER — Ambulatory Visit

## 2024-03-11 ENCOUNTER — Encounter: Payer: Self-pay | Admitting: Cardiology

## 2024-03-11 DIAGNOSIS — R0602 Shortness of breath: Secondary | ICD-10-CM

## 2024-03-12 ENCOUNTER — Other Ambulatory Visit: Payer: Self-pay | Admitting: Internal Medicine

## 2024-03-12 DIAGNOSIS — E785 Hyperlipidemia, unspecified: Secondary | ICD-10-CM

## 2024-03-17 ENCOUNTER — Ambulatory Visit
Admission: RE | Admit: 2024-03-17 | Discharge: 2024-03-17 | Disposition: A | Discharge status: Home / Self Care | Source: Ambulatory Visit | Attending: Internal Medicine | Admitting: Internal Medicine

## 2024-03-17 DIAGNOSIS — Z1231 Encounter for screening mammogram for malignant neoplasm of breast: Secondary | ICD-10-CM | POA: Insufficient documentation

## 2024-04-13 ENCOUNTER — Encounter: Payer: Self-pay | Admitting: Cardiology

## 2024-04-20 ENCOUNTER — Ambulatory Visit
Admission: RE | Admit: 2024-04-20 | Discharge: 2024-04-20 | Disposition: A | Discharge status: Home / Self Care | Source: Ambulatory Visit | Attending: Cardiology | Admitting: Cardiology

## 2024-04-20 DIAGNOSIS — R0602 Shortness of breath: Secondary | ICD-10-CM | POA: Diagnosis not present

## 2024-04-20 LAB — NM MYOCAR MULTI W/SPECT W/WALL MOTION / EF
Angina Index: 0
Duke Treadmill Score: 7
Estimated workload: 8.7
Exercise duration (min): 7 min
Exercise duration (sec): 6 s
LV dias vol: 41 mL (ref 46–106)
LV sys vol: 6 mL (ref 3.8–5.2)
MPHR: 147 {beats}/min
Nuc Stress EF: 81 %
Peak HR: 162 {beats}/min
Percent HR: 110 %
Rest HR: 96 {beats}/min
Rest Nuclear Isotope Dose: 9.8 mCi
SDS: 0
SRS: 23
SSS: 0
ST Depression (mm): 0 mm
Stress Nuclear Isotope Dose: 30.2 mCi
TID: 0.65

## 2024-04-20 MED ORDER — TECHNETIUM TC 99M TETROFOSMIN IV KIT
10.0000 | PACK | Freq: Once | INTRAVENOUS | Status: AC | PRN
  Administered 2024-04-20: 9.82 via INTRAVENOUS

## 2024-04-20 MED ORDER — TECHNETIUM TC 99M TETROFOSMIN IV KIT
30.0000 | PACK | Freq: Once | INTRAVENOUS | Status: AC | PRN
  Administered 2024-04-20: 30.17 via INTRAVENOUS

## 2024-04-22 ENCOUNTER — Ambulatory Visit: Payer: Self-pay | Admitting: Cardiology

## 2024-04-22 DIAGNOSIS — H5213 Myopia, bilateral: Secondary | ICD-10-CM | POA: Diagnosis not present

## 2024-04-22 NOTE — Telephone Encounter (Signed)
 Because I was not certain that we were doing this CPX is yet.  Would I decide to going proceed with a Myoview  stress test which was completed and looks relatively normal.

## 2024-04-22 NOTE — Progress Notes (Signed)
 Last read by Jama KATHEE Pinal at 8:19AM on 04/22/2024.

## 2024-04-26 ENCOUNTER — Ambulatory Visit (HOSPITAL_COMMUNITY): Attending: Cardiology

## 2024-04-26 DIAGNOSIS — R0609 Other forms of dyspnea: Secondary | ICD-10-CM | POA: Diagnosis not present

## 2024-04-26 DIAGNOSIS — R000 Tachycardia, unspecified: Secondary | ICD-10-CM | POA: Insufficient documentation

## 2024-04-26 DIAGNOSIS — R001 Bradycardia, unspecified: Secondary | ICD-10-CM | POA: Diagnosis not present

## 2024-04-26 NOTE — Telephone Encounter (Signed)
 The Myoview  was relatively reassuring as far as looking for coronary artery disease, but the CPX helps distinguish cardiac versus pulmonary etiology versus simply deconditioning as a reason for symptoms.  Blood tests are effective.  My original thought was to do the CPX,, but was told that these were not being done yet.   I think  that is perfectly fine to hold off on the CPX until we can discuss the results of the Myoview  in follow-up, and see how she is doing from a symptom standpoint.  If the test was already done, then it will help us  answer questions as well.   Alm Clay, MD

## 2024-05-03 ENCOUNTER — Ambulatory Visit: Payer: Self-pay | Admitting: Cardiology

## 2024-05-07 ENCOUNTER — Encounter: Payer: Self-pay | Admitting: Pharmacist

## 2024-05-07 NOTE — Progress Notes (Signed)
 Pharmacy Quality Measure Review  This patient is appearing on a report for being at risk of failing the adherence measure for cholesterol (statin) medications this calendar year.   Medication: Rosuvastatin  10 mg Last fill date: 12/11/23 for 90 day supply  Insurance report was not up to date. No action needed at this time.  Medication refilled late, though has been refilled as of 03/16/24 x90 ds. No further action needed at this time.

## 2024-05-24 DIAGNOSIS — R300 Dysuria: Secondary | ICD-10-CM | POA: Diagnosis not present

## 2024-05-24 DIAGNOSIS — N390 Urinary tract infection, site not specified: Secondary | ICD-10-CM | POA: Diagnosis not present

## 2024-05-24 NOTE — ED Provider Notes (Signed)
 [facility]  ED Provider Note  Prarthana Parlin 73 y.o. female DOB: March 19, 1951 MRN: [id number] History   Chief Complaint  Patient presents with  . Dysuria    Started last night with urinary discomfort and frequency no fevers that she knows of but with in the last hour noticed blood in her urine and is having bladder pain.    73 year old female presents to the ED with complaints of lower abdominal discomfort, urinary frequency, urgency, and dysuria since yesterday.  Patient states that today her urinary symptoms became worse.  She endorses hematuria as well.  She denies nausea, vomiting, fever, chills.  No changes in bowel habits.  Patient states that she has never had a urinary tract infection before.  Denies flank pain.   History provided by:  Patient Language interpreter used: No        Past Medical History:  Diagnosis Date  . IBS (irritable bowel syndrome)     Past Surgical History:  Procedure Laterality Date  . Cholecystectomy      Social History   Substance and Sexual Activity  Alcohol Use Yes   Tobacco Use History[1] E-Cigarettes  . Vaping Use Never User   . Start Date    . Cartridges/Day    . Quit Date     Social History   Substance and Sexual Activity  Drug Use Never         Allergies[2]  Home Medications   CHOLECALCIFEROL (DELTA D3) 10 MCG (400 UNIT) TABS    Take two tablets by mouth daily.   FLUTICASONE  PROPIONATE (FLONASE ) 50 MCG/ACTUATION NASAL SPRAY    one spray by Nasal route daily.   OMEPRAZOLE  (PRILOSEC) 40 MG CAPSULE    Take one capsule (40 mg dose) by mouth daily.   ROSUVASTATIN  CALCIUM  (CRESTOR ) 10 MG TABLET    Take one tablet (10 mg dose) by mouth at bedtime.   VITAMIN B-12 (CYANOCOBALAMIN ) 1000 MCG TABLET    Take one tablet (1,000 mcg dose) by mouth daily.    Primary Survey  Primary Survey  Review of Systems   Review of Systems  Genitourinary:  Positive for dysuria, hematuria and urgency.     Physical Exam   ED Triage Vitals [05/24/24 2226]  BP 130/80  Heart Rate 92  Resp 18  SpO2 98 %  Temp 97.7 F (36.5 C)    Physical Exam  Vitals reviewed. Constitutional: She appears well-developed and well-nourished. She no respiratory distress.  HENT:  Head: Normocephalic and atraumatic.  Eyes: Conjunctivae are normal. Pupils are equal, round, and reactive to light.  Neck: Normal range of motion. No stridor.  Cardiovascular: Normal rate and regular rhythm.  Pulmonary/Chest: No respiratory distress. Respiratory effort normal.  Abdominal: Soft. There is no abdominal tenderness. Abdomen not distended. There is no CVA tenderness.  Musculoskeletal: Normal range of motion. No obvious deformity noted to extremities.     Cervical back: Normal range of motion.   Neurological: She is alert and oriented to person, place, and time. She has normal speech.  Skin: Skin is warm. Skin is dry.  Psychiatric: She has a normal mood and affect. Her behavior is normal.     ED Course   Lab results:   URINALYSIS W/MICRO REFLEX CULTURE - SYMPTOMATIC - Abnormal      Result Value   Urine Color Amber (*)    Urine Clarity Cloudy (*)    Urine Specific Gravity 1.009     Urine pH 6.0  Urine Protein - Dipstick 100 (*)    Urine Glucose Negative     Urine Ketones Negative     Urine Bilirubin Negative     Urine Blood Large (*)    Urine Nitrite Positive (*)    Urine Urobilinogen 0.2     Urine Leukocyte Esterase Moderate (*)    Urine Squamous Epithelial Cells 0-2     Urine WBC 35-50 (*)    Urine RBC >200 (*)    Urine Bacteria 2+ (*)    Urine Mucous 1+ (*)    UA Microscopic Yes Micro (*)   CBC AND DIFFERENTIAL - Abnormal   WBC 14.1 (*)    RBC 4.23     HGB 13.2     HCT 40.4     MCV 95.5 (*)    MCH 31.2     MCHC 32.7     Plt Ct 279     RDW SD 46.1     MPV 9.6     NRBC% 0.0     Absolute NRBC Count 0.00     NEUTROPHIL % 72.9     LYMPHOCYTE % 17.0     MONOCYTE % 8.3     Eosinophil %  0.9     BASOPHIL % 0.5     IG% 0.4     ABSOLUTE NEUTROPHIL COUNT 10.28 (*)    ABSOLUTE LYMPHOCYTE COUNT 2.39     Absolute Monocyte Count 1.17 (*)    Absolute Eosinophil Count 0.12     Absolute Basophil Count 0.07     Absolute Immature Granulocyte Count 0.05 (*)   COMPREHENSIVE METABOLIC PANEL - Abnormal   Na 134 (*)    Potassium 3.8     Cl 97     CO2 26     AGAP 11     Glucose 96     BUN 15     Creatinine 0.92     Ca 9.6     ALK PHOS 114     T Bili 0.4     Total Protein 7.7     Alb 4.3     GLOBULIN 3.4     ALBUMIN/GLOBULIN RATIO 1.3     BUN/CREAT RATIO 16.3     ALT 17     AST 27     eGFR 66     Comment: Normal GFR (glomerular filtration rate) > 60 mL/min/1.73 meters squared, < 60 may include impaired kidney function. Calculation based on the Chronic Kidney Disease Epidemiology Collaboration (CK-EPI)equation refit without adjustment for race.  CULTURE, URINE    Imaging: No data to display    ECG: ECG Results   None                                                                     Pre-Sedation Procedures    Medical Decision Making Patient seen and examined, vitals reviewed and are stable.  Lab work is notable for white count 14.1, H&H are stable.  Sodium 134, otherwise no acute electrolyte abnormalities noted on chemistry.  Urinalysis demonstrates positive nitrates, moderate leukocyte esterase, 35-50 WBC, 200 RBC, 2+ bacteria.  Urine culture pending.  Patient is not having any flank pain or CVA tenderness, denies fevers at home and is afebrile and  nontoxic-appearing here.  She was medicated with fluids, Zofran, and Rocephin while in the ED.  I offered admission to the patient to states that she would like to trial PO antibiotics, and I believe this is reasonable at this time.  Strict return precautions were discussed and she verbalized understanding.  Keflex, antiemetics and analgesics sent to her pharmacy.  Advised close  outpatient follow-up when she returns home.  Discharged in stable condition.  Amount and/or Complexity of Data Reviewed Labs: ordered.  Risk Prescription drug management.            Provider Communication  New Prescriptions   CEPHALEXIN (KEFLEX) 500 MG CAPSULE    Take one capsule (500 mg dose) by mouth 3 (three) times a day for 7 days.      Quantity: 21 capsule    Refills: 0   HYDROCODONE -ACETAMINOPHEN  (NORCO) 5-325 MG PER TABLET    Take one tablet by mouth every 6 (six) hours as needed for Pain for up to 3 days. Max Daily Amount: 4 tablets      Quantity: 12 tablet    Refills: 0   ONDANSETRON (ZOFRAN-ODT) 4 MG DISINTEGRATING TABLET    Take one tablet (4 mg dose) by mouth every 8 (eight) hours as needed for Nausea for up to 7 days.      Quantity: 12 tablet    Refills: 0    Modified Medications   No medications on file    Discontinued Medications   No medications on file    Clinical Impression Final diagnoses:  Acute UTI    ED Disposition     ED Disposition  Discharge   Condition  Stable   Comment  --                 Follow-up Information     Schedule an appointment as soon as possible for a visit  with Verneita Kettering, MD.   Specialty: Internal Medicine Contact information: [address] [place] [address] [phone]         Go to  [facility].   Specialty: Emergency Medicine Comments: As needed, If symptoms worsen Contact information: [address] [place]  [address] [phone]                 Electronically signed by:       [1] Social History Tobacco Use  Smoking Status Never  Smokeless Tobacco Never  [2] Allergies Allergen Reactions  . Amoxicillin -Pot Clavulanate Nausea Only and Rash    Pt with rash  Occasionally makes her have nausea  . Azithromycin  Rash  . Tramadol  Nausea And Vomiting  . Vancomycin Nausea And Vomiting and Other   Geni SHAUNNA Loffler,  PA-C 05/25/24 0141

## 2024-05-27 ENCOUNTER — Encounter: Payer: Self-pay | Admitting: Internal Medicine

## 2024-05-27 DIAGNOSIS — N3090 Cystitis, unspecified without hematuria: Secondary | ICD-10-CM

## 2024-05-27 NOTE — Telephone Encounter (Signed)
 Pt notified to schedule an appointment.

## 2024-05-31 ENCOUNTER — Other Ambulatory Visit (INDEPENDENT_AMBULATORY_CARE_PROVIDER_SITE_OTHER)

## 2024-05-31 DIAGNOSIS — N3090 Cystitis, unspecified without hematuria: Secondary | ICD-10-CM | POA: Diagnosis not present

## 2024-05-31 LAB — CBC WITH DIFFERENTIAL/PLATELET
Basophils Absolute: 0 K/uL (ref 0.0–0.1)
Basophils Relative: 0.6 % (ref 0.0–3.0)
Eosinophils Absolute: 0 K/uL (ref 0.0–0.7)
Eosinophils Relative: 0.6 % (ref 0.0–5.0)
HCT: 39.7 % (ref 36.0–46.0)
Hemoglobin: 13.2 g/dL (ref 12.0–15.0)
Lymphocytes Relative: 30.4 % (ref 12.0–46.0)
Lymphs Abs: 2 K/uL (ref 0.7–4.0)
MCHC: 33.3 g/dL (ref 30.0–36.0)
MCV: 93.9 fl (ref 78.0–100.0)
Monocytes Absolute: 0.6 K/uL (ref 0.1–1.0)
Monocytes Relative: 9.3 % (ref 3.0–12.0)
Neutro Abs: 4 K/uL (ref 1.4–7.7)
Neutrophils Relative %: 59.1 % (ref 43.0–77.0)
Platelets: 295 K/uL (ref 150.0–400.0)
RBC: 4.23 Mil/uL (ref 3.87–5.11)
RDW: 13.4 % (ref 11.5–15.5)
WBC: 6.7 K/uL (ref 4.0–10.5)

## 2024-05-31 LAB — URINALYSIS, ROUTINE W REFLEX MICROSCOPIC
Bilirubin Urine: NEGATIVE
Hgb urine dipstick: NEGATIVE
Ketones, ur: NEGATIVE
Leukocytes,Ua: NEGATIVE
Nitrite: NEGATIVE
Specific Gravity, Urine: 1.02 (ref 1.000–1.030)
Urine Glucose: NEGATIVE
Urobilinogen, UA: 0.2 (ref 0.0–1.0)
pH: 6 (ref 5.0–8.0)

## 2024-06-01 LAB — URINE CULTURE
MICRO NUMBER:: 16845638
Result:: NO GROWTH
SPECIMEN QUALITY:: ADEQUATE

## 2024-06-02 ENCOUNTER — Encounter: Payer: Self-pay | Admitting: Internal Medicine

## 2024-06-02 ENCOUNTER — Ambulatory Visit (INDEPENDENT_AMBULATORY_CARE_PROVIDER_SITE_OTHER): Admitting: Internal Medicine

## 2024-06-02 VITALS — BP 120/82 | HR 87 | Temp 97.7°F | Ht 64.5 in | Wt 118.8 lb

## 2024-06-02 DIAGNOSIS — N3001 Acute cystitis with hematuria: Secondary | ICD-10-CM

## 2024-06-02 DIAGNOSIS — N390 Urinary tract infection, site not specified: Secondary | ICD-10-CM | POA: Insufficient documentation

## 2024-06-02 DIAGNOSIS — R197 Diarrhea, unspecified: Secondary | ICD-10-CM | POA: Diagnosis not present

## 2024-06-02 NOTE — Patient Instructions (Addendum)
-   It was a pleasure meeting you today -I suspect that you likely had a urinary tract infection possibly associated with a kidney stone.  However given that your pain has improved and the blood in the urine has resolved you likely passed it if you did have a stone -The urinary tract infection appears to have resolved.  Repeat urine tests were within normal limits -I suspect that your diarrhea is likely secondary to the antibiotic use.  Given that you do have a history of C. difficile I would hold off on the Imodium as this could worsen an infection -I would keep your appointment with Dr. Marylynn for next week for now.  If the diarrhea persists please follow-up with Dr. Marylynn -Please contact us  with any questions or concerns or if you need any refills

## 2024-06-02 NOTE — Progress Notes (Signed)
 Acute Office Visit  Subjective:     Patient ID: ZAMARAH ULLMER, female    DOB: [birth year], 73 y.o.   MRN: [id number]  Chief Complaint  Patient presents with   Acute Visit    Discuss symptoms from ED visit on 05/24/24 and labs Patient was diagnosed with a UTI on 05/24/24 but the episode only lasted 5 hours    HPI source Discussed the use of AI scribe software for clinical note transcription with the patient, who gave verbal consent to proceed.  History of Present Illness ANASTASYA JEWELL is a 73 year old female who presents with urinary symptoms and recent diarrhea following antibiotic treatment.  Dysuria and hematuria - Onset of burning with urination at the end of dinner while at the beach approximately 9 days ago very well - Hematuria noted upon returning home - Associated with right lower abdominal and back pain - Pain described as dull, with severe intensity for 4-5 hours after returning home from the hospital - Frequent urination during the period of severe pain - Pain has improved but occasional dull back pain persists  Abdominal and flank pain - Right lower abdominal and back pain concurrent with urinary symptoms - Pain was severe for 4-5 hours after hospital discharge, now improved - Occasional dull back pain persists  Antibiotic use and adverse effects - Received IV antibiotics in the emergency room for elevated white blood cell count (14,000) - Completed oral Keflex course on Monday - Received Zofran normal for nausea - History of diarrhea following antibiotic use  Diarrhea - Severe diarrhea onset yesterday after completing antibiotics - Diarrhea described as watery, brown, and mucousy - No blood or melena in stool - Diarrhea resolved after taking Imodium this morning - History of C. difficile infection - Uses Questran  for diarrhea management but had not taken it for a few days while ill, which she believes contributed to symptoms    Review of Systems   Constitutional: Negative.   HENT: Negative.    Respiratory: Negative.    Cardiovascular: Negative.   Gastrointestinal:  Positive for diarrhea. Negative for abdominal pain, blood in stool, melena, nausea and vomiting.  Genitourinary: Negative.  Negative for flank pain, frequency, hematuria and urgency.  Musculoskeletal:  Positive for back pain.       Complains of dull right-sided back pain occurring intermittently  Neurological: Negative.   Psychiatric/Behavioral: Negative.          Objective:    BP 120/82   Pulse 87   Temp 97.7 F (36.5 C)   Ht 5' 4.5 (1.638 m)   Wt 118 lb 12.8 oz (53.9 kg)   SpO2 97%   BMI 20.08 kg/m    Physical Exam Constitutional:      Appearance: Normal appearance.  HENT:     Head: Normocephalic and atraumatic.  Cardiovascular:     Rate and Rhythm: Normal rate and regular rhythm.     Heart sounds: Normal heart sounds.  Pulmonary:     Effort: Pulmonary effort is normal.     Breath sounds: Normal breath sounds. No wheezing, rhonchi or rales.  Abdominal:     General: Bowel sounds are normal. There is no distension.     Palpations: Abdomen is soft.     Tenderness: There is no abdominal tenderness. There is no right CVA tenderness, left CVA tenderness, guarding or rebound.  Musculoskeletal:        General: No swelling or tenderness.     Right lower leg: No  edema.     Left lower leg: No edema.  Neurological:     Mental Status: She is alert.  Psychiatric:        Mood and Affect: Mood normal.        Behavior: Behavior normal.     No results found for any visits on 06/02/24.      Assessment & Plan:   Problem List Items Addressed This Visit       Genitourinary   UTI (urinary tract infection) - Primary   - Patient has had the beach approximately 10 days ago and developed increased urinary frequency, urgency as well as hematuria as well as mild right-sided flank pain -She was seen in the emergency room at that time and had a UA done  (result reviewed) which showed blood in the urine, positive nitrite, 35-50 leukocytes per high-power field as well as 2+ bacteria -Patient also had a CBC done at that time (I reviewed this result as well) which showed leukocytosis of 14.1 - Patient received IV antibiotics at the time and was sent home with oral Keflex which she completed on Monday -Her symptoms have completely resolved now -Repeat UA and culture were done by Dr. Marylynn which were negative for an infection (reviewed these results as well) -I suspect she likely had a UTI plus or minus nephrolithiasis with resolution of symptoms after antibiotics and possibly passing a stone -No further workup at this time        Other   Diarrhea   - Patient states that she only takes Questran  at home after her cholecystectomy but has been off this medication since she was diagnosed with UTI -She states that yesterday she developed diarrhea which was brown watery and associated with mucus.  No hematochezia or melena noted.  Patient had multiple episodes throughout the day -Took Imodium today and has had no further episodes of diarrhea -Abdomen is soft, nontender, nondistended normoactive bowel sounds -I suspect that she likely has antibiotic associated diarrhea - However, she does have a history of C. difficile.  I advised her to  avoid Imodium given that history -If she has a persistent diarrhea she should follow-up with Dr. Marylynn next week -No further workup at this time       No orders of the defined types were placed in this encounter.   No follow-ups on file.  Damica Gravlin, MD

## 2024-06-02 NOTE — Assessment & Plan Note (Signed)
-   Patient has had the beach approximately 10 days ago and developed increased urinary frequency, urgency as well as hematuria as well as mild right-sided flank pain -She was seen in the emergency room at that time and had a UA done (result reviewed) which showed blood in the urine, positive nitrite, 35-50 leukocytes per high-power field as well as 2+ bacteria -Patient also had a CBC done at that time (I reviewed this result as well) which showed leukocytosis of 14.1 - Patient received IV antibiotics at the time and was sent home with oral Keflex which she completed on Monday -Her symptoms have completely resolved now -Repeat UA and culture were done by Dr. Marylynn which were negative for an infection (reviewed these results as well) -I suspect she likely had a UTI plus or minus nephrolithiasis with resolution of symptoms after antibiotics and possibly passing a stone -No further workup at this time

## 2024-06-02 NOTE — Assessment & Plan Note (Signed)
-   Patient states that she only takes Questran  at home after her cholecystectomy but has been off this medication since she was diagnosed with UTI -She states that yesterday she developed diarrhea which was brown watery and associated with mucus.  No hematochezia or melena noted.  Patient had multiple episodes throughout the day -Took Imodium today and has had no further episodes of diarrhea -Abdomen is soft, nontender, nondistended normoactive bowel sounds -I suspect that she likely has antibiotic associated diarrhea - However, she does have a history of C. difficile.  I advised her to  avoid Imodium given that history -If she has a persistent diarrhea she should follow-up with Dr. Marylynn next week -No further workup at this time

## 2024-06-03 ENCOUNTER — Ambulatory Visit: Payer: Self-pay | Admitting: Internal Medicine

## 2024-06-09 ENCOUNTER — Inpatient Hospital Stay: Admitting: Internal Medicine

## 2024-06-21 ENCOUNTER — Other Ambulatory Visit: Payer: Self-pay | Admitting: Internal Medicine

## 2024-06-21 DIAGNOSIS — E785 Hyperlipidemia, unspecified: Secondary | ICD-10-CM

## 2024-07-12 DIAGNOSIS — L570 Actinic keratosis: Secondary | ICD-10-CM | POA: Diagnosis not present

## 2024-07-12 DIAGNOSIS — L814 Other melanin hyperpigmentation: Secondary | ICD-10-CM | POA: Diagnosis not present

## 2024-07-12 DIAGNOSIS — D1801 Hemangioma of skin and subcutaneous tissue: Secondary | ICD-10-CM | POA: Diagnosis not present

## 2024-07-12 DIAGNOSIS — L821 Other seborrheic keratosis: Secondary | ICD-10-CM | POA: Diagnosis not present

## 2024-07-12 DIAGNOSIS — L281 Prurigo nodularis: Secondary | ICD-10-CM | POA: Diagnosis not present

## 2024-08-02 ENCOUNTER — Other Ambulatory Visit: Payer: Self-pay | Admitting: Internal Medicine

## 2024-08-02 DIAGNOSIS — E785 Hyperlipidemia, unspecified: Secondary | ICD-10-CM

## 2024-08-05 ENCOUNTER — Encounter: Payer: Self-pay | Admitting: Internal Medicine

## 2024-08-05 ENCOUNTER — Ambulatory Visit: Payer: Medicare Other | Admitting: Internal Medicine

## 2024-08-05 VITALS — BP 120/86 | HR 72 | Ht 64.5 in | Wt 120.8 lb

## 2024-08-05 DIAGNOSIS — Z0000 Encounter for general adult medical examination without abnormal findings: Secondary | ICD-10-CM | POA: Diagnosis not present

## 2024-08-05 DIAGNOSIS — Z0001 Encounter for general adult medical examination with abnormal findings: Secondary | ICD-10-CM

## 2024-08-05 DIAGNOSIS — R5383 Other fatigue: Secondary | ICD-10-CM | POA: Diagnosis not present

## 2024-08-05 DIAGNOSIS — N200 Calculus of kidney: Secondary | ICD-10-CM

## 2024-08-05 DIAGNOSIS — Z23 Encounter for immunization: Secondary | ICD-10-CM | POA: Diagnosis not present

## 2024-08-05 DIAGNOSIS — R0609 Other forms of dyspnea: Secondary | ICD-10-CM

## 2024-08-05 DIAGNOSIS — R7301 Impaired fasting glucose: Secondary | ICD-10-CM

## 2024-08-05 DIAGNOSIS — R000 Tachycardia, unspecified: Secondary | ICD-10-CM

## 2024-08-05 DIAGNOSIS — E785 Hyperlipidemia, unspecified: Secondary | ICD-10-CM

## 2024-08-05 LAB — CBC WITH DIFFERENTIAL/PLATELET
Basophils Absolute: 0 K/uL (ref 0.0–0.1)
Basophils Relative: 0.7 % (ref 0.0–3.0)
Eosinophils Absolute: 0.1 K/uL (ref 0.0–0.7)
Eosinophils Relative: 1.6 % (ref 0.0–5.0)
HCT: 41.1 % (ref 36.0–46.0)
Hemoglobin: 13.4 g/dL (ref 12.0–15.0)
Lymphocytes Relative: 36.6 % (ref 12.0–46.0)
Lymphs Abs: 2.2 K/uL (ref 0.7–4.0)
MCHC: 32.5 g/dL (ref 30.0–36.0)
MCV: 96.3 fl (ref 78.0–100.0)
Monocytes Absolute: 0.8 K/uL (ref 0.1–1.0)
Monocytes Relative: 12.8 % — ABNORMAL HIGH (ref 3.0–12.0)
Neutro Abs: 2.9 K/uL (ref 1.4–7.7)
Neutrophils Relative %: 48.3 % (ref 43.0–77.0)
Platelets: 300 K/uL (ref 150.0–400.0)
RBC: 4.27 Mil/uL (ref 3.87–5.11)
RDW: 14.2 % (ref 11.5–15.5)
WBC: 6 K/uL (ref 4.0–10.5)

## 2024-08-05 LAB — LIPID PANEL
Cholesterol: 202 mg/dL — ABNORMAL HIGH (ref 0–200)
HDL: 84.9 mg/dL (ref >39.00)
LDL Cholesterol: 95 mg/dL (ref 0–99)
NonHDL: 117.41
Total CHOL/HDL Ratio: 2
Triglycerides: 113 mg/dL (ref 0.0–149.0)
VLDL: 22.6 mg/dL (ref 0.0–40.0)

## 2024-08-05 LAB — LDL CHOLESTEROL, DIRECT: Direct LDL: 100 mg/dL

## 2024-08-05 LAB — COMPREHENSIVE METABOLIC PANEL WITH GFR
ALT: 18 U/L (ref 0–35)
AST: 27 U/L (ref 0–37)
Albumin: 4.6 g/dL (ref 3.5–5.2)
Alkaline Phosphatase: 98 U/L (ref 39–117)
BUN: 15 mg/dL (ref 6–23)
CO2: 27 meq/L (ref 19–32)
Calcium: 9.5 mg/dL (ref 8.4–10.5)
Chloride: 103 meq/L (ref 96–112)
Creatinine, Ser: 0.91 mg/dL (ref 0.40–1.20)
GFR: 62.67 mL/min (ref >60.00)
Glucose, Bld: 85 mg/dL (ref 70–99)
Potassium: 4.6 meq/L (ref 3.5–5.1)
Sodium: 140 meq/L (ref 135–145)
Total Bilirubin: 0.7 mg/dL (ref 0.2–1.2)
Total Protein: 7.4 g/dL (ref 6.0–8.3)

## 2024-08-05 LAB — URINALYSIS, ROUTINE W REFLEX MICROSCOPIC
Bilirubin Urine: NEGATIVE
Ketones, ur: NEGATIVE
Leukocytes,Ua: NEGATIVE
Nitrite: NEGATIVE
Specific Gravity, Urine: 1.025 (ref 1.000–1.030)
Total Protein, Urine: NEGATIVE
Urine Glucose: NEGATIVE
Urobilinogen, UA: 0.2 (ref 0.0–1.0)
pH: 6 (ref 5.0–8.0)

## 2024-08-05 LAB — TSH: TSH: 2.06 u[IU]/mL (ref 0.35–5.50)

## 2024-08-05 LAB — CK: Total CK: 154 U/L (ref 17–177)

## 2024-08-05 LAB — HEMOGLOBIN A1C: Hgb A1c MFr Bld: 5.8 % (ref 4.6–6.5)

## 2024-08-05 MED ORDER — VALACYCLOVIR HCL 1 G PO TABS: 1000.0000 mg | ORAL_TABLET | Freq: Two times a day (BID) | ORAL | 0 refills | Status: AC

## 2024-08-05 MED ORDER — TAMSULOSIN HCL 0.4 MG PO CAPS: 0.4000 mg | ORAL_CAPSULE | Freq: Every day | ORAL | 0 refills | Status: AC

## 2024-08-05 MED ORDER — TETANUS-DIPHTH-ACELL PERTUSSIS 5-2-15.5 LF-MCG/0.5 IM SUSP: 0.5000 mL | Freq: Once | INTRAMUSCULAR | 0 refills | Status: AC

## 2024-08-05 NOTE — Progress Notes (Signed)
 Patient ID: Taylor [name], female    DOB: 1951-04-15  Age: 73 y.o. MRN: [id number]  The patient is here for annual preventive examination and management of other chronic and acute problems.   The risk factors are reflected in the social history.   The roster of all physicians providing medical care to patient - is listed in the Snapshot section of the chart.   Activities of daily living:  The patient is 100% independent in all ADLs: dressing, toileting, feeding as well as independent mobility   Home safety : The patient has smoke detectors in the home. They wear seatbelts.  There are no unsecured firearms at home. There is no violence in the home.    There is no risks for hepatitis, STDs or HIV. There is no   history of blood transfusion. They have no travel history to infectious disease endemic areas of the world.   The patient has seen their dentist in the last six month. They have seen their eye doctor in the last year. The patinet  denies slight hearing difficulty with regard to whispered voices and some television programs.  They have deferred audiologic testing in the last year.  They do not  have excessive sun exposure. Discussed the need for sun protection: hats, long sleeves and use of sunscreen if there is significant sun exposure.    Diet: the importance of a healthy diet is discussed. They do have a healthy diet.   The benefits of regular aerobic exercise were discussed. The patient  exercises  3 to 5 days per week  for  60 minutes.    Depression screen: there are no signs or vegative symptoms of depression- irritability, change in appetite, anhedonia, sadness/tearfullness.   The following portions of the patient's history were reviewed and updated as appropriate: allergies, current medications, past family history, past medical history,  past surgical history, past social history  and problem list.   Visual acuity was not assessed per patient preference since the patient has regular  follow up with an  ophthalmologist. Hearing and body mass index were assessed and reviewed.    During the course of the visit the patient was educated and counseled about appropriate screening and preventive services including : fall prevention , diabetes screening, nutrition counseling, colorectal cancer screening, and recommended immunizations.    Chief Complaint:   1) recent episode of hemorrhagic cystitis followed by [facility] in August reviewed  2)    Review of Symptoms  Patient denies headache, fevers, malaise, unintentional weight loss, skin rash, eye pain, sinus congestion and sinus pain, sore throat, dysphagia,  hemoptysis , cough, dyspnea, wheezing, chest pain, palpitations, orthopnea, edema, abdominal pain, nausea, melena, diarrhea, constipation, flank pain, dysuria, hematuria, urinary  Frequency, nocturia, numbness, tingling, seizures,  Focal weakness, Loss of consciousness,  Tremor, insomnia, depression, anxiety, and suicidal ideation.    Physical Exam:  BP 120/86   Pulse 72   Ht 5' 4.5 (1.638 m)   Wt 120 lb 12.8 oz (54.8 kg)   SpO2 98%   BMI 20.42 kg/m    Physical Exam Vitals reviewed.  Constitutional:      General: She is not in acute distress.    Appearance: Normal appearance. She is well-developed and normal weight. She is not ill-appearing, toxic-appearing or diaphoretic.  HENT:     Head: Normocephalic.     Right Ear: Tympanic membrane, ear canal and external ear normal. There is no impacted cerumen.     Left Ear: Tympanic membrane,  ear canal and external ear normal. There is no impacted cerumen.     Nose: Nose normal.     Mouth/Throat:     Mouth: Mucous membranes are moist.     Pharynx: Oropharynx is clear.  Eyes:     General: No scleral icterus.       Right eye: No discharge.        Left eye: No discharge.     Conjunctiva/sclera: Conjunctivae normal.     Pupils: Pupils are equal, round, and reactive to light.  Neck:     Thyroid : No thyromegaly.      Vascular: No carotid bruit or JVD.  Cardiovascular:     Rate and Rhythm: Normal rate and regular rhythm.     Heart sounds: Normal heart sounds.  Pulmonary:     Effort: Pulmonary effort is normal. No respiratory distress.     Breath sounds: Normal breath sounds.  Chest:  Breasts:    Breasts are symmetrical.     Right: Normal. No swelling, inverted nipple, mass, nipple discharge, skin change or tenderness.     Left: Normal. No swelling, inverted nipple, mass, nipple discharge, skin change or tenderness.  Abdominal:     General: Bowel sounds are normal.     Palpations: Abdomen is soft. There is no mass.     Tenderness: There is no abdominal tenderness. There is no guarding or rebound.  Musculoskeletal:        General: Swelling present. Normal range of motion.     Cervical back: Normal range of motion and neck supple.  Lymphadenopathy:     Cervical: No cervical adenopathy.     Upper Body:     Right upper body: No supraclavicular, axillary or pectoral adenopathy.     Left upper body: No supraclavicular, axillary or pectoral adenopathy.  Skin:    General: Skin is warm and dry.  Neurological:     General: No focal deficit present.     Mental Status: She is alert and oriented to person, place, and time. Mental status is at baseline.  Psychiatric:        Mood and Affect: Mood normal.        Behavior: Behavior normal.        Thought Content: Thought content normal.        Judgment: Judgment normal.    Assessment and Plan: Hyperlipidemia with target LDL less than 100 -     Lipid panel -     LDL cholesterol, direct  Other fatigue -     CBC with Differential/Platelet -     TSH  Impaired fasting glucose -     Comprehensive metabolic panel with GFR -     Hemoglobin A1c  Nephrolithiasis -     CK -     Urinalysis, Routine w reflex microscopic  Need for influenza vaccination -     Flu vaccine HIGH DOSE PF(Fluzone Trivalent)  Encounter for general adult medical examination with  abnormal findings Assessment & Plan: age appropriate education and counseling updated, referrals for preventative services and immunizations addressed, dietary and smoking counseling addressed, most recent labs reviewed.  I have personally reviewed and have noted:   1) the patient's medical and social history 2) The pt's use of alcohol, tobacco, and illicit drugs 3) The patient's current medications and supplements 4) Functional ability including ADL's, fall risk, home safety risk, hearing and visual impairment 5) Diet and physical activities 6) Evidence for depression or mood disorder 7) The patient's height,  weight, and BMI have been recorded in the chart     I have made referrals, and provided counseling and education based on review of the above    Exercise-induced tachycardia Assessment & Plan: No evidence of atrial fibrillation by  ZIO monitor.     She has evidence of left atrial enlargement Episodes occur with exertion,   but exercise testing with gas exchange shows normal functional capacity with no obvious cardiopulmonary abnormality.    Exertional dyspnea Assessment & Plan: Exercise testing with gas exchange shows normal functional capacity with no obvious cardiopulmonary abnormality.    Left nephrolithiasis Assessment & Plan: Suggested by history of gross hematuria and dysuria  with reoport of passing a  black spec   Other orders -     Tamsulosin HCl; Take 1 capsule (0.4 mg total) by mouth daily.  Dispense: 30 capsule; Refill: 0 -     valACYclovir HCl; Take 1 tablet (1,000 mg total) by mouth 2 (two) times daily for 10 days.  Dispense: 20 tablet; Refill: 0 -     Tetanus-Diphth-Acell Pertussis; Inject 0.5 mLs into the muscle once for 1 dose.  Dispense: 0.5 mL; Refill: 0    Return in about 6 months (around 02/03/2025).  Verneita LITTIE Kettering, MD

## 2024-08-05 NOTE — Patient Instructions (Addendum)
  You are OVERDUE for your tetanus-diptheria-pertussis vaccine   (TDaP)   Please get this done at your pharmacy l;  it will be PAID FOR MY MEDICARE ONLY AT YOUR PHARMACY   I have sent Flomax to [facility] pharmacy to start if you develop symptoms of a kidney stone again (blood in urine)  I have sent valtrex to the pharmacy to use for 10 days for the next fever blister

## 2024-08-06 ENCOUNTER — Ambulatory Visit: Payer: Self-pay | Admitting: Internal Medicine

## 2024-08-07 DIAGNOSIS — N200 Calculus of kidney: Secondary | ICD-10-CM | POA: Insufficient documentation

## 2024-08-07 NOTE — Assessment & Plan Note (Signed)
 Suggested by history of gross hematuria and dysuria  with reoport of passing a  black spec

## 2024-08-07 NOTE — Assessment & Plan Note (Addendum)
 No evidence of atrial fibrillation by  ZIO monitor.     She has evidence of left atrial enlargement Episodes occur with exertion,   but exercise testing with gas exchange shows normal functional capacity with no obvious cardiopulmonary abnormality.

## 2024-08-07 NOTE — Assessment & Plan Note (Signed)
 Exercise testing with gas exchange shows normal functional capacity with no obvious cardiopulmonary abnormality.

## 2024-08-07 NOTE — Assessment & Plan Note (Signed)

## 2024-09-01 ENCOUNTER — Other Ambulatory Visit: Payer: Self-pay | Admitting: Internal Medicine

## 2024-09-06 ENCOUNTER — Other Ambulatory Visit: Payer: Self-pay | Admitting: Internal Medicine

## 2024-09-07 NOTE — Telephone Encounter (Signed)
 Last office visit 08/05/2024 for annual exam.  Looks like the tamsulosin  was prescribed on 08/05/2024 for #30 with no refills for a kidney stone.  Refill?

## 2024-11-01 ENCOUNTER — Ambulatory Visit: Admitting: *Deleted

## 2024-11-01 VITALS — Ht 64.5 in | Wt 119.0 lb

## 2024-11-01 DIAGNOSIS — Z0000 Encounter for general adult medical examination without abnormal findings: Secondary | ICD-10-CM

## 2024-11-01 NOTE — Patient Instructions (Addendum)
 [name],  Thank you for taking the time for your Medicare Wellness Visit. I appreciate your continued commitment to your health goals. Please review the care plan we discussed, and feel free to reach out if I can assist you further.  Please note that Annual Wellness Visits do not include a physical exam. Some assessments may be limited, especially if the visit was conducted virtually. If needed, we may recommend an in-person follow-up with your provider.  Ongoing Care Seeing your primary care provider every 3 to 6 months helps us  monitor your health and provide consistent, personalized care.  Remember to update your tetanus and shingles vaccines. Make sure you bring a copy of your Advanced Directives to our office.   Referrals If a referral was made during today's visit and you haven't received any updates within two weeks, please contact the referred provider directly to check on the status.  Recommended Screenings:  Health Maintenance  Topic Date Due   Zoster (Shingles) Vaccine (1 of 2) 04/13/2001   DTaP/Tdap/Td vaccine (3 - Td or Tdap) 04/28/2023   COVID-19 Vaccine (4 - 2025-26 season) 06/14/2024   Breast Cancer Screening  03/17/2025   Medicare Annual Wellness Visit  11/01/2025   Colon Cancer Screening  09/28/2033   Pneumococcal Vaccine for age over 11  Completed   Flu Shot  Completed   Osteoporosis screening with Bone Density Scan  Completed   Hepatitis C Screening  Completed   Meningitis B Vaccine  Aged Out       11/01/2024    3:50 PM  Advanced Directives  Does Patient Have a Medical Advance Directive? Yes  Type of Estate Agent of [place];Living will  Does patient want to make changes to medical advance directive? No - Patient declined  Copy of Healthcare Power of Attorney in Chart? No - copy requested    Vision: Annual vision screenings are recommended for early detection of glaucoma, cataracts, and diabetic retinopathy. These exams can also  reveal signs of chronic conditions such as diabetes and high blood pressure.  Dental: Annual dental screenings help detect early signs of oral cancer, gum disease, and other conditions linked to overall health, including heart disease and diabetes.  Please see the attached documents for additional preventive care recommendations.

## 2024-11-01 NOTE — Progress Notes (Signed)
 "  Chief Complaint  Patient presents with   Medicare Wellness     Subjective:   Taylor [name] is a 74 y.o. female who presents for a Medicare Annual Wellness Visit.  Visit info / Clinical Intake: Medicare Wellness Visit Type:: Subsequent Annual Wellness Visit Persons participating in visit and providing information:: patient Medicare Wellness Visit Mode:: Video Since this visit was completed virtually, some vitals may be partially provided or unavailable. Missing vitals are due to the limitations of the virtual format.: Unable to obtain vitals - no equipment If Telephone or Video please confirm:: I connected with patient using audio/video enable telemedicine. I verified patient identity with two identifiers, discussed telehealth limitations, and patient agreed to proceed. Patient Location:: [facility] Ps:: Office/Home Interpreter Needed?: No Pre-visit prep was completed: yes AWV questionnaire completed by patient prior to visit?: no Living arrangements:: lives with spouse/significant other Patient's Overall Health Status Rating: very good Typical amount of pain: none Does pain affect daily life?: no Are you currently prescribed opioids?: no  Dietary Habits and Nutritional Risks How many meals a day?: 2 Eats fruit and vegetables daily?: yes Most meals are obtained by: preparing own meals In the last 2 weeks, have you had any of the following?: none Diabetic:: no  Functional Status Activities of Daily Living (to include ambulation/medication): Independent Ambulation: Independent Medication Administration: Independent Home Management (perform basic housework or laundry): Independent Manage your own finances?: yes Primary transportation is: driving Concerns about vision?: no *vision screening is required for WTM* Concerns about hearing?: no  Fall Screening Falls in the past year?: 0 Number of falls in past year: 0 Was there an injury with Fall?: 0 Fall Risk  Category Calculator: 0 Patient Fall Risk Level: Low Fall Risk  Fall Risk Patient at Risk for Falls Due to: No Fall Risks Fall risk Follow up: Falls evaluation completed; Falls prevention discussed  Home and Transportation Safety: All rugs have non-skid backing?: yes All stairs or steps have railings?: yes Grab bars in the bathtub or shower?: (!) no Have non-skid surface in bathtub or shower?: yes Good home lighting?: yes Regular seat belt use?: yes Hospital stays in the last year:: no  Cognitive Assessment Difficulty concentrating, remembering, or making decisions? : no Will 6CIT or Mini Cog be Completed: yes What year is it?: 0 points What month is it?: 0 points Give patient an address phrase to remember (5 components): [address] [place] About what time is it?: 0 points Count backwards from 20 to 1: 0 points Say the months of the year in reverse: 0 points Repeat the address phrase from earlier: 0 points 6 CIT Score: 0 points  Advance Directives (For Healthcare) Does Patient Have a Medical Advance Directive?: Yes Does patient want to make changes to medical advance directive?: No - Patient declined Type of Advance Directive: Healthcare Power of [place]; Living will Copy of Healthcare Power of Attorney in Chart?: No - copy requested Copy of Living Will in Chart?: No - copy requested  Reviewed/Updated  Reviewed/Updated: Reviewed All (Medical, Surgical, Family, Medications, Allergies, Care Teams, Patient Goals)    Allergies (verified) Tramadol , Vancomycin, Amoxicillin -pot clavulanate, and Azithromycin    Current Medications (verified) Outpatient Encounter Medications as of 11/01/2024  Medication Sig   cholestyramine  (QUESTRAN ) 4 GM/DOSE powder USE ONE SCOOPFUL THREE TIMES A DAY WITH MEALS AS DIRECTED   Melatonin 5 MG CHEW Chew by mouth as needed.   omeprazole  (PRILOSEC) 40 MG capsule TAKE 1 CAPSULE BY MOUTH DAILY.  OVER THE COUNTER MEDICATION at bedtime as  needed (magnesium).   rosuvastatin  (CRESTOR ) 10 MG tablet TAKE ONE TABLET BY MOUTH AT BEDTIME   tamsulosin  (FLOMAX ) 0.4 MG CAPS capsule Take 1 capsule (0.4 mg total) by mouth daily. (Patient taking differently: Take 0.4 mg by mouth daily as needed.)   timolol (TIMOPTIC) 0.5 % ophthalmic solution    vitamin B-12 (CYANOCOBALAMIN ) 1000 MCG tablet Take 1,000 mcg by mouth daily.   No facility-administered encounter medications on file as of 11/01/2024.    History: Past Medical History:  Diagnosis Date   Cataract Removed in 2020   Chest pain    Fever blister    GERD (gastroesophageal reflux disease)    Heart murmur Aortic dilitation   Hyperlipidemia    Irritable bowel syndrome    Mitral valve prolapse    Normal cardiac stress test 2007   Osteoporosis    Pill esophagitis 10/23/2012   Secondary to alendronate . Workup included chest CT for PE given [place],  Symptoms  resolved.  Prior EGD from [facility] was already positive for esophgatis.     Past Surgical History:  Procedure Laterality Date   CATARACT EXTRACTION Left 07/2019   CHOLECYSTECTOMY  Approx - 2002   COLONOSCOPY WITH PROPOFOL  N/A 09/29/2023   Procedure: COLONOSCOPY WITH PROPOFOL ;  Surgeon: Therisa Bi, MD;  Location: [facility] ENDOSCOPY;  Service: Gastroenterology;  Laterality: N/A;   EYE SURGERY  Cataract 2020   POLYPECTOMY  09/29/2023   Procedure: POLYPECTOMY;  Surgeon: Therisa Bi, MD;  Location: [facility] ENDOSCOPY;  Service: Gastroenterology;;   Family History  Problem Relation Age of Onset   Heart disease Father 87       massive AMI, now post CABG   Arthritis Father    Heart disease Brother 79       CABG   Multiple sclerosis Mother    Aortic aneurysm Paternal Grandfather        died of rupture   ADD / ADHD Son    Breast cancer Neg Hx    Social History   Occupational History   Not on file  Tobacco Use   Smoking status: Never   Smokeless tobacco: Never  Vaping Use   Vaping status: Never Used  Substance  and Sexual Activity   Alcohol use: Yes    Alcohol/week: 3.0 standard drinks of alcohol    Types: 3 Glasses of wine per week   Drug use: No   Sexual activity: Not on file   Tobacco Counseling Counseling given: Not Answered  SDOH Screenings   Food Insecurity: No Food Insecurity (11/01/2024)  Housing: Low Risk (11/01/2024)  Transportation Needs: No Transportation Needs (11/01/2024)  Utilities: Not At Risk (11/01/2024)  Alcohol Screen: Low Risk (11/01/2024)  Depression (PHQ2-9): Low Risk (11/01/2024)  Financial Resource Strain: Low Risk (11/01/2024)  Physical Activity: Sufficiently Active (11/01/2024)  Social Connections: Socially Integrated (11/01/2024)  Stress: No Stress Concern Present (11/01/2024)  Tobacco Use: Low Risk (11/01/2024)  Health Literacy: Adequate Health Literacy (11/01/2024)   See flowsheets for full screening details  Depression Screen PHQ 2 & 9 Depression Scale- Over the past 2 weeks, how often have you been bothered by any of the following problems? Little interest or pleasure in doing things: 0 Feeling down, depressed, or hopeless (PHQ Adolescent also includes...irritable): 0 PHQ-2 Total Score: 0 Trouble falling or staying asleep, or sleeping too much: 0 Feeling tired or having little energy: 0 Poor appetite or overeating (PHQ Adolescent also includes...weight loss): 0 Feeling bad about yourself -  or that you are a failure or have let yourself or your family down: 0 Trouble concentrating on things, such as reading the newspaper or watching television (PHQ Adolescent also includes...like school work): 0 Moving or speaking so slowly that other people could have noticed. Or the opposite - being so fidgety or restless that you have been moving around a lot more than usual: 0 Thoughts that you would be better off dead, or of hurting yourself in some way: 0 PHQ-9 Total Score: 0 If you checked off any problems, how difficult have these problems made it for you to do your work,  take care of things at home, or get along with other people?: Not difficult at all     Goals Addressed             This Visit's Progress    Patient Stated       Wants to stay active and go to the gym             Objective:    Today's Vitals   11/01/24 1543  Weight: 119 lb (54 kg)  Height: 5' 4.5 (1.638 m)   Body mass index is 20.11 kg/m.  Hearing/Vision screen Hearing Screening - Comments:: No issues Vision Screening - Comments:: Contact, [facility], up to date Immunizations and Health Maintenance Health Maintenance  Topic Date Due   Zoster Vaccines- Shingrix (1 of 2) 04/13/2001   DTaP/Tdap/Td (3 - Td or Tdap) 04/28/2023   COVID-19 Vaccine (4 - 2025-26 season) 06/14/2024   Mammogram  03/17/2025   Medicare Annual Wellness (AWV)  11/01/2025   Colonoscopy  09/28/2033   Pneumococcal Vaccine: 50+ Years  Completed   Influenza Vaccine  Completed   Bone Density Scan  Completed   Hepatitis C Screening  Completed   Meningococcal B Vaccine  Aged Out        Assessment/Plan:  This is a routine wellness examination for [employer].  Patient Care Team: Marylynn Verneita CROME, MD as PCP - General (Internal Medicine) Anner Alm ORN, MD as Consulting Physician (Cardiology)  I have personally reviewed and noted the following in the patients chart:   Medical and social history Use of alcohol, tobacco or illicit drugs  Current medications and supplements including opioid prescriptions. Functional ability and status Nutritional status Physical activity Advanced directives List of other physicians Hospitalizations, surgeries, and ER visits in previous 12 months Vitals Screenings to include cognitive, depression, and falls Referrals and appointments  No orders of the defined types were placed in this encounter.  In addition, I have reviewed and discussed with patient certain preventive protocols, quality metrics, and best practice recommendations. A written personalized care  plan for preventive services as well as general preventive health recommendations were provided to patient.   Angeline Fredericks, LPN   8/80/7973   Return in 1 year (on 11/01/2025).  After Visit Summary: (MyChart) Due to this being a telephonic visit, the after visit summary with patients personalized plan was offered to patient via MyChart   Nurse Notes: Discussed the need to update shingles and tetanus vaccines. Patient declines covid vaccine. "

## 2025-02-02 ENCOUNTER — Ambulatory Visit: Admitting: Internal Medicine

## 2025-11-07 ENCOUNTER — Ambulatory Visit
